# Patient Record
Sex: Male | Born: 1940
Health system: Southern US, Community
[De-identification: ages and names within clinical notes are randomized; demographics above are authoritative.]

## PROBLEM LIST (undated history)

## (undated) DIAGNOSIS — R1310 Dysphagia, unspecified: Secondary | ICD-10-CM

## (undated) DIAGNOSIS — E785 Hyperlipidemia, unspecified: Secondary | ICD-10-CM

## (undated) DIAGNOSIS — I4891 Unspecified atrial fibrillation: Secondary | ICD-10-CM

## (undated) DIAGNOSIS — I10 Essential (primary) hypertension: Secondary | ICD-10-CM

## (undated) DIAGNOSIS — M549 Dorsalgia, unspecified: Secondary | ICD-10-CM

## (undated) DIAGNOSIS — I499 Cardiac arrhythmia, unspecified: Secondary | ICD-10-CM

## (undated) DIAGNOSIS — K649 Unspecified hemorrhoids: Secondary | ICD-10-CM

## (undated) HISTORY — DX: Essential (primary) hypertension: I10

## (undated) HISTORY — PX: BACK SURGERY: SHX140

## (undated) HISTORY — DX: Unspecified hemorrhoids: K64.9

## (undated) HISTORY — DX: Hyperlipidemia, unspecified: E78.5

## (undated) HISTORY — DX: Dorsalgia, unspecified: M54.9

## (undated) HISTORY — PX: ESOPHAGOGASTRODUODENOSCOPY: SHX1529

## (undated) HISTORY — PX: ABLATION: SHX5711

## (undated) HISTORY — DX: Unspecified atrial fibrillation: I48.91

---

## 2002-09-21 HISTORY — PX: LAMINECTOMY: SHX219

## 2004-10-23 ENCOUNTER — Ambulatory Visit: Payer: Self-pay | Admitting: Gastroenterology

## 2009-09-21 HISTORY — PX: EYE SURGERY: SHX253

## 2010-08-17 ENCOUNTER — Inpatient Hospital Stay: Payer: Self-pay | Admitting: Internal Medicine

## 2011-10-05 DIAGNOSIS — I4892 Unspecified atrial flutter: Secondary | ICD-10-CM | POA: Diagnosis not present

## 2011-10-05 DIAGNOSIS — R5383 Other fatigue: Secondary | ICD-10-CM | POA: Diagnosis not present

## 2011-10-05 DIAGNOSIS — Z7982 Long term (current) use of aspirin: Secondary | ICD-10-CM | POA: Diagnosis not present

## 2011-10-05 DIAGNOSIS — E785 Hyperlipidemia, unspecified: Secondary | ICD-10-CM | POA: Diagnosis not present

## 2011-10-05 DIAGNOSIS — Z79899 Other long term (current) drug therapy: Secondary | ICD-10-CM | POA: Diagnosis not present

## 2011-10-05 DIAGNOSIS — I4891 Unspecified atrial fibrillation: Secondary | ICD-10-CM | POA: Diagnosis not present

## 2011-10-05 DIAGNOSIS — R5381 Other malaise: Secondary | ICD-10-CM | POA: Diagnosis not present

## 2011-10-05 DIAGNOSIS — R069 Unspecified abnormalities of breathing: Secondary | ICD-10-CM | POA: Diagnosis not present

## 2011-10-05 DIAGNOSIS — Z01818 Encounter for other preprocedural examination: Secondary | ICD-10-CM | POA: Diagnosis not present

## 2011-10-06 DIAGNOSIS — I499 Cardiac arrhythmia, unspecified: Secondary | ICD-10-CM | POA: Diagnosis not present

## 2011-10-06 DIAGNOSIS — R069 Unspecified abnormalities of breathing: Secondary | ICD-10-CM | POA: Diagnosis not present

## 2011-10-06 DIAGNOSIS — Z79899 Other long term (current) drug therapy: Secondary | ICD-10-CM | POA: Diagnosis not present

## 2011-10-06 DIAGNOSIS — Z7982 Long term (current) use of aspirin: Secondary | ICD-10-CM | POA: Diagnosis not present

## 2011-10-06 DIAGNOSIS — I4892 Unspecified atrial flutter: Secondary | ICD-10-CM | POA: Diagnosis not present

## 2011-10-06 DIAGNOSIS — I4891 Unspecified atrial fibrillation: Secondary | ICD-10-CM | POA: Diagnosis not present

## 2011-10-06 DIAGNOSIS — E785 Hyperlipidemia, unspecified: Secondary | ICD-10-CM | POA: Diagnosis not present

## 2011-10-07 DIAGNOSIS — Z7982 Long term (current) use of aspirin: Secondary | ICD-10-CM | POA: Diagnosis not present

## 2011-10-07 DIAGNOSIS — Z79899 Other long term (current) drug therapy: Secondary | ICD-10-CM | POA: Diagnosis not present

## 2011-10-07 DIAGNOSIS — E785 Hyperlipidemia, unspecified: Secondary | ICD-10-CM | POA: Diagnosis not present

## 2011-10-07 DIAGNOSIS — I4891 Unspecified atrial fibrillation: Secondary | ICD-10-CM | POA: Diagnosis not present

## 2011-10-07 DIAGNOSIS — I4892 Unspecified atrial flutter: Secondary | ICD-10-CM | POA: Diagnosis not present

## 2011-10-07 DIAGNOSIS — I499 Cardiac arrhythmia, unspecified: Secondary | ICD-10-CM | POA: Diagnosis not present

## 2011-10-07 DIAGNOSIS — R069 Unspecified abnormalities of breathing: Secondary | ICD-10-CM | POA: Diagnosis not present

## 2011-10-29 DIAGNOSIS — E782 Mixed hyperlipidemia: Secondary | ICD-10-CM | POA: Diagnosis not present

## 2011-10-29 DIAGNOSIS — I4892 Unspecified atrial flutter: Secondary | ICD-10-CM | POA: Diagnosis not present

## 2011-10-29 DIAGNOSIS — I4891 Unspecified atrial fibrillation: Secondary | ICD-10-CM | POA: Diagnosis not present

## 2011-11-04 ENCOUNTER — Ambulatory Visit (INDEPENDENT_AMBULATORY_CARE_PROVIDER_SITE_OTHER): Payer: Medicare Other | Admitting: Ophthalmology

## 2011-11-04 DIAGNOSIS — H43819 Vitreous degeneration, unspecified eye: Secondary | ICD-10-CM

## 2011-11-04 DIAGNOSIS — H35379 Puckering of macula, unspecified eye: Secondary | ICD-10-CM

## 2011-11-13 DIAGNOSIS — I4892 Unspecified atrial flutter: Secondary | ICD-10-CM | POA: Diagnosis not present

## 2011-11-13 DIAGNOSIS — I4891 Unspecified atrial fibrillation: Secondary | ICD-10-CM | POA: Diagnosis not present

## 2011-11-23 ENCOUNTER — Ambulatory Visit: Payer: Self-pay | Admitting: Family Medicine

## 2011-11-23 DIAGNOSIS — M79609 Pain in unspecified limb: Secondary | ICD-10-CM | POA: Diagnosis not present

## 2011-11-23 DIAGNOSIS — R000 Tachycardia, unspecified: Secondary | ICD-10-CM | POA: Diagnosis not present

## 2011-11-23 DIAGNOSIS — S9000XA Contusion of unspecified ankle, initial encounter: Secondary | ICD-10-CM | POA: Diagnosis not present

## 2011-11-23 DIAGNOSIS — S92919A Unspecified fracture of unspecified toe(s), initial encounter for closed fracture: Secondary | ICD-10-CM | POA: Diagnosis not present

## 2011-11-23 DIAGNOSIS — I4891 Unspecified atrial fibrillation: Secondary | ICD-10-CM | POA: Diagnosis not present

## 2011-11-23 DIAGNOSIS — S90129A Contusion of unspecified lesser toe(s) without damage to nail, initial encounter: Secondary | ICD-10-CM | POA: Diagnosis not present

## 2012-01-22 DIAGNOSIS — I4891 Unspecified atrial fibrillation: Secondary | ICD-10-CM | POA: Diagnosis not present

## 2012-01-22 DIAGNOSIS — I4892 Unspecified atrial flutter: Secondary | ICD-10-CM | POA: Diagnosis not present

## 2012-01-25 DIAGNOSIS — I4891 Unspecified atrial fibrillation: Secondary | ICD-10-CM | POA: Diagnosis not present

## 2012-02-05 DIAGNOSIS — I4891 Unspecified atrial fibrillation: Secondary | ICD-10-CM | POA: Diagnosis not present

## 2012-02-24 ENCOUNTER — Emergency Department: Payer: Self-pay | Admitting: *Deleted

## 2012-02-26 DIAGNOSIS — I4891 Unspecified atrial fibrillation: Secondary | ICD-10-CM | POA: Diagnosis not present

## 2012-02-26 DIAGNOSIS — I10 Essential (primary) hypertension: Secondary | ICD-10-CM | POA: Diagnosis not present

## 2012-03-08 ENCOUNTER — Ambulatory Visit: Payer: Self-pay | Admitting: Family Medicine

## 2012-03-08 DIAGNOSIS — E7800 Pure hypercholesterolemia, unspecified: Secondary | ICD-10-CM | POA: Diagnosis not present

## 2012-03-08 DIAGNOSIS — M5137 Other intervertebral disc degeneration, lumbosacral region: Secondary | ICD-10-CM | POA: Diagnosis not present

## 2012-03-08 DIAGNOSIS — R209 Unspecified disturbances of skin sensation: Secondary | ICD-10-CM | POA: Diagnosis not present

## 2012-03-08 DIAGNOSIS — M549 Dorsalgia, unspecified: Secondary | ICD-10-CM | POA: Diagnosis not present

## 2012-03-08 DIAGNOSIS — R1310 Dysphagia, unspecified: Secondary | ICD-10-CM | POA: Diagnosis not present

## 2012-03-08 DIAGNOSIS — M51379 Other intervertebral disc degeneration, lumbosacral region without mention of lumbar back pain or lower extremity pain: Secondary | ICD-10-CM | POA: Diagnosis not present

## 2012-03-08 DIAGNOSIS — Z0000 Encounter for general adult medical examination without abnormal findings: Secondary | ICD-10-CM | POA: Diagnosis not present

## 2012-03-08 DIAGNOSIS — M79609 Pain in unspecified limb: Secondary | ICD-10-CM | POA: Diagnosis not present

## 2012-03-16 ENCOUNTER — Ambulatory Visit: Payer: Self-pay | Admitting: Family Medicine

## 2012-03-16 DIAGNOSIS — M5126 Other intervertebral disc displacement, lumbar region: Secondary | ICD-10-CM | POA: Diagnosis not present

## 2012-03-16 DIAGNOSIS — M5137 Other intervertebral disc degeneration, lumbosacral region: Secondary | ICD-10-CM | POA: Diagnosis not present

## 2012-03-21 DIAGNOSIS — C44320 Squamous cell carcinoma of skin of unspecified parts of face: Secondary | ICD-10-CM | POA: Diagnosis not present

## 2012-03-21 DIAGNOSIS — L570 Actinic keratosis: Secondary | ICD-10-CM | POA: Diagnosis not present

## 2012-03-21 DIAGNOSIS — D485 Neoplasm of uncertain behavior of skin: Secondary | ICD-10-CM | POA: Diagnosis not present

## 2012-03-21 DIAGNOSIS — Z85828 Personal history of other malignant neoplasm of skin: Secondary | ICD-10-CM | POA: Diagnosis not present

## 2012-03-30 DIAGNOSIS — D0439 Carcinoma in situ of skin of other parts of face: Secondary | ICD-10-CM | POA: Diagnosis not present

## 2012-03-30 DIAGNOSIS — C44320 Squamous cell carcinoma of skin of unspecified parts of face: Secondary | ICD-10-CM | POA: Diagnosis not present

## 2012-03-30 DIAGNOSIS — D0430 Carcinoma in situ of skin of unspecified part of face: Secondary | ICD-10-CM | POA: Diagnosis not present

## 2012-04-12 DIAGNOSIS — M47817 Spondylosis without myelopathy or radiculopathy, lumbosacral region: Secondary | ICD-10-CM | POA: Diagnosis not present

## 2012-04-12 DIAGNOSIS — M48061 Spinal stenosis, lumbar region without neurogenic claudication: Secondary | ICD-10-CM | POA: Diagnosis not present

## 2012-04-12 DIAGNOSIS — M5137 Other intervertebral disc degeneration, lumbosacral region: Secondary | ICD-10-CM | POA: Diagnosis not present

## 2012-04-12 DIAGNOSIS — Q762 Congenital spondylolisthesis: Secondary | ICD-10-CM | POA: Diagnosis not present

## 2012-04-25 DIAGNOSIS — R1310 Dysphagia, unspecified: Secondary | ICD-10-CM | POA: Diagnosis not present

## 2012-04-26 ENCOUNTER — Ambulatory Visit: Payer: Self-pay | Admitting: Gastroenterology

## 2012-04-26 DIAGNOSIS — K219 Gastro-esophageal reflux disease without esophagitis: Secondary | ICD-10-CM | POA: Diagnosis not present

## 2012-04-26 DIAGNOSIS — K449 Diaphragmatic hernia without obstruction or gangrene: Secondary | ICD-10-CM | POA: Diagnosis not present

## 2012-04-26 DIAGNOSIS — R1310 Dysphagia, unspecified: Secondary | ICD-10-CM | POA: Diagnosis not present

## 2012-04-29 DIAGNOSIS — M5137 Other intervertebral disc degeneration, lumbosacral region: Secondary | ICD-10-CM | POA: Diagnosis not present

## 2012-04-29 DIAGNOSIS — M48061 Spinal stenosis, lumbar region without neurogenic claudication: Secondary | ICD-10-CM | POA: Diagnosis not present

## 2012-04-29 DIAGNOSIS — Q762 Congenital spondylolisthesis: Secondary | ICD-10-CM | POA: Diagnosis not present

## 2012-04-29 DIAGNOSIS — M51379 Other intervertebral disc degeneration, lumbosacral region without mention of lumbar back pain or lower extremity pain: Secondary | ICD-10-CM | POA: Diagnosis not present

## 2012-05-07 ENCOUNTER — Ambulatory Visit: Payer: Self-pay

## 2012-05-07 DIAGNOSIS — R21 Rash and other nonspecific skin eruption: Secondary | ICD-10-CM | POA: Diagnosis not present

## 2012-05-07 DIAGNOSIS — Z9889 Other specified postprocedural states: Secondary | ICD-10-CM | POA: Diagnosis not present

## 2012-05-07 DIAGNOSIS — Z79899 Other long term (current) drug therapy: Secondary | ICD-10-CM | POA: Diagnosis not present

## 2012-05-07 LAB — CBC WITH DIFFERENTIAL/PLATELET
Basophil #: 0 10*3/uL (ref 0.0–0.1)
Basophil %: 0.5 %
Eosinophil #: 0.3 10*3/uL (ref 0.0–0.7)
HCT: 41.1 % (ref 40.0–52.0)
HGB: 14 g/dL (ref 13.0–18.0)
Lymphocyte %: 19 %
MCV: 94 fL (ref 80–100)
Monocyte %: 9.1 %
Neutrophil #: 3.8 10*3/uL (ref 1.4–6.5)
Neutrophil %: 66.2 %
Platelet: 150 10*3/uL (ref 150–440)
RBC: 4.38 10*6/uL — ABNORMAL LOW (ref 4.40–5.90)
RDW: 13 % (ref 11.5–14.5)
WBC: 5.7 10*3/uL (ref 3.8–10.6)

## 2012-05-07 LAB — BASIC METABOLIC PANEL
Anion Gap: 4 — ABNORMAL LOW (ref 7–16)
BUN: 14 mg/dL (ref 7–18)
Creatinine: 0.97 mg/dL (ref 0.60–1.30)
EGFR (African American): >60
EGFR (Non-African Amer.): >60
Glucose: 99 mg/dL (ref 65–99)

## 2012-05-13 DIAGNOSIS — M48061 Spinal stenosis, lumbar region without neurogenic claudication: Secondary | ICD-10-CM | POA: Diagnosis not present

## 2012-05-19 ENCOUNTER — Ambulatory Visit: Payer: Self-pay | Admitting: Gastroenterology

## 2012-05-19 DIAGNOSIS — Z79899 Other long term (current) drug therapy: Secondary | ICD-10-CM | POA: Diagnosis not present

## 2012-05-19 DIAGNOSIS — M48061 Spinal stenosis, lumbar region without neurogenic claudication: Secondary | ICD-10-CM | POA: Diagnosis not present

## 2012-05-19 DIAGNOSIS — I4891 Unspecified atrial fibrillation: Secondary | ICD-10-CM | POA: Diagnosis not present

## 2012-05-19 DIAGNOSIS — Z6826 Body mass index (BMI) 26.0-26.9, adult: Secondary | ICD-10-CM | POA: Diagnosis not present

## 2012-05-19 DIAGNOSIS — Z801 Family history of malignant neoplasm of trachea, bronchus and lung: Secondary | ICD-10-CM | POA: Diagnosis not present

## 2012-05-19 DIAGNOSIS — K2980 Duodenitis without bleeding: Secondary | ICD-10-CM | POA: Diagnosis not present

## 2012-05-19 DIAGNOSIS — R1310 Dysphagia, unspecified: Secondary | ICD-10-CM | POA: Diagnosis not present

## 2012-05-19 DIAGNOSIS — K222 Esophageal obstruction: Secondary | ICD-10-CM | POA: Diagnosis not present

## 2012-05-19 DIAGNOSIS — I4892 Unspecified atrial flutter: Secondary | ICD-10-CM | POA: Diagnosis not present

## 2012-05-19 DIAGNOSIS — E785 Hyperlipidemia, unspecified: Secondary | ICD-10-CM | POA: Diagnosis not present

## 2012-05-19 DIAGNOSIS — K5289 Other specified noninfective gastroenteritis and colitis: Secondary | ICD-10-CM | POA: Diagnosis not present

## 2012-05-19 DIAGNOSIS — Z7982 Long term (current) use of aspirin: Secondary | ICD-10-CM | POA: Diagnosis not present

## 2012-05-24 LAB — PATHOLOGY REPORT

## 2012-05-25 DIAGNOSIS — I4891 Unspecified atrial fibrillation: Secondary | ICD-10-CM | POA: Diagnosis not present

## 2012-05-25 DIAGNOSIS — E782 Mixed hyperlipidemia: Secondary | ICD-10-CM | POA: Diagnosis not present

## 2012-05-25 DIAGNOSIS — I10 Essential (primary) hypertension: Secondary | ICD-10-CM | POA: Diagnosis not present

## 2012-06-21 DIAGNOSIS — M48061 Spinal stenosis, lumbar region without neurogenic claudication: Secondary | ICD-10-CM | POA: Diagnosis not present

## 2012-06-21 DIAGNOSIS — Z79899 Other long term (current) drug therapy: Secondary | ICD-10-CM | POA: Diagnosis not present

## 2012-06-28 DIAGNOSIS — Z23 Encounter for immunization: Secondary | ICD-10-CM | POA: Diagnosis not present

## 2012-06-30 DIAGNOSIS — Z85828 Personal history of other malignant neoplasm of skin: Secondary | ICD-10-CM | POA: Diagnosis not present

## 2012-06-30 DIAGNOSIS — L570 Actinic keratosis: Secondary | ICD-10-CM | POA: Diagnosis not present

## 2012-07-18 DIAGNOSIS — I4891 Unspecified atrial fibrillation: Secondary | ICD-10-CM | POA: Diagnosis not present

## 2012-09-28 DIAGNOSIS — E7800 Pure hypercholesterolemia, unspecified: Secondary | ICD-10-CM | POA: Diagnosis not present

## 2012-10-20 DIAGNOSIS — I4891 Unspecified atrial fibrillation: Secondary | ICD-10-CM | POA: Diagnosis not present

## 2012-10-21 DIAGNOSIS — Z79899 Other long term (current) drug therapy: Secondary | ICD-10-CM | POA: Diagnosis not present

## 2012-11-03 ENCOUNTER — Ambulatory Visit (INDEPENDENT_AMBULATORY_CARE_PROVIDER_SITE_OTHER): Payer: Medicare Other | Admitting: Ophthalmology

## 2012-11-25 ENCOUNTER — Ambulatory Visit (INDEPENDENT_AMBULATORY_CARE_PROVIDER_SITE_OTHER): Payer: Medicare Other | Admitting: Ophthalmology

## 2012-12-09 ENCOUNTER — Ambulatory Visit (INDEPENDENT_AMBULATORY_CARE_PROVIDER_SITE_OTHER): Payer: Medicare Other | Admitting: Ophthalmology

## 2012-12-09 DIAGNOSIS — H26499 Other secondary cataract, unspecified eye: Secondary | ICD-10-CM | POA: Diagnosis not present

## 2012-12-09 DIAGNOSIS — H43819 Vitreous degeneration, unspecified eye: Secondary | ICD-10-CM | POA: Diagnosis not present

## 2012-12-09 DIAGNOSIS — H35379 Puckering of macula, unspecified eye: Secondary | ICD-10-CM | POA: Diagnosis not present

## 2013-01-11 ENCOUNTER — Emergency Department: Payer: Self-pay

## 2013-01-11 DIAGNOSIS — M7989 Other specified soft tissue disorders: Secondary | ICD-10-CM | POA: Diagnosis not present

## 2013-01-11 DIAGNOSIS — R609 Edema, unspecified: Secondary | ICD-10-CM | POA: Diagnosis not present

## 2013-01-11 LAB — CBC
HCT: 43 % (ref 40.0–52.0)
HGB: 14.4 g/dL (ref 13.0–18.0)
MCH: 31.4 pg (ref 26.0–34.0)
MCHC: 33.6 g/dL (ref 32.0–36.0)
MCV: 93 fL (ref 80–100)
Platelet: 171 10*3/uL (ref 150–440)
RBC: 4.61 10*6/uL (ref 4.40–5.90)
RDW: 13.1 % (ref 11.5–14.5)
WBC: 8.6 10*3/uL (ref 3.8–10.6)

## 2013-01-11 LAB — COMPREHENSIVE METABOLIC PANEL
Albumin: 3.9 g/dL (ref 3.4–5.0)
Calcium, Total: 8.8 mg/dL (ref 8.5–10.1)
Creatinine: 0.85 mg/dL (ref 0.60–1.30)
EGFR (Non-African Amer.): >60
Glucose: 85 mg/dL (ref 65–99)
Osmolality: 271 (ref 275–301)
Potassium: 4 mmol/L (ref 3.5–5.1)
SGOT(AST): 24 U/L (ref 15–37)
Sodium: 136 mmol/L (ref 136–145)

## 2013-01-12 DIAGNOSIS — M7989 Other specified soft tissue disorders: Secondary | ICD-10-CM | POA: Diagnosis not present

## 2013-01-12 DIAGNOSIS — I872 Venous insufficiency (chronic) (peripheral): Secondary | ICD-10-CM | POA: Diagnosis not present

## 2013-01-16 ENCOUNTER — Other Ambulatory Visit (INDEPENDENT_AMBULATORY_CARE_PROVIDER_SITE_OTHER): Payer: Medicare Other | Admitting: Ophthalmology

## 2013-01-16 DIAGNOSIS — H2700 Aphakia, unspecified eye: Secondary | ICD-10-CM

## 2013-01-18 DIAGNOSIS — I4891 Unspecified atrial fibrillation: Secondary | ICD-10-CM | POA: Diagnosis not present

## 2013-01-18 DIAGNOSIS — I10 Essential (primary) hypertension: Secondary | ICD-10-CM | POA: Diagnosis not present

## 2013-01-19 DIAGNOSIS — I890 Lymphedema, not elsewhere classified: Secondary | ICD-10-CM | POA: Diagnosis not present

## 2013-01-19 DIAGNOSIS — M7989 Other specified soft tissue disorders: Secondary | ICD-10-CM | POA: Diagnosis not present

## 2013-01-19 DIAGNOSIS — I10 Essential (primary) hypertension: Secondary | ICD-10-CM | POA: Diagnosis not present

## 2013-01-19 DIAGNOSIS — M79609 Pain in unspecified limb: Secondary | ICD-10-CM | POA: Diagnosis not present

## 2013-02-17 IMAGING — RF DG BARIUM SWALLOW
3 series · 12 of 12 positions shown · non-contrast
Comparison: none

REASON FOR EXAM: dysphagia  with tablet
COMMENTS:

[Series 1: fluoro_barium 2fps_bw · 0.17mm/px · 4 of 20 frames shown (1 of 3)]
[frame 4/20]
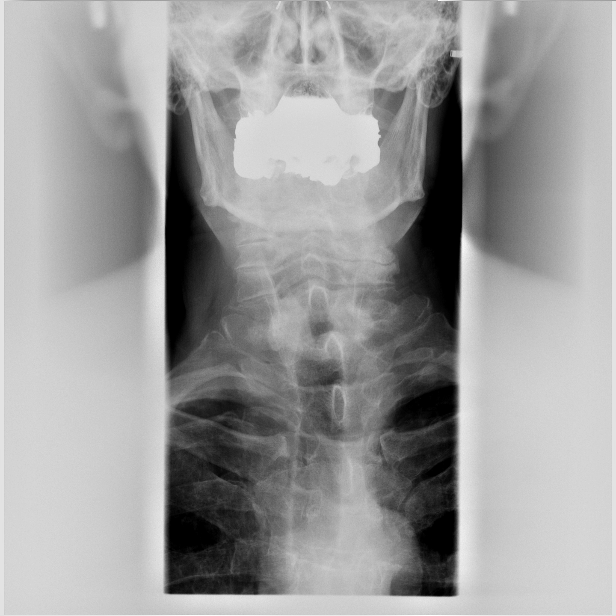
[frame 8/20]
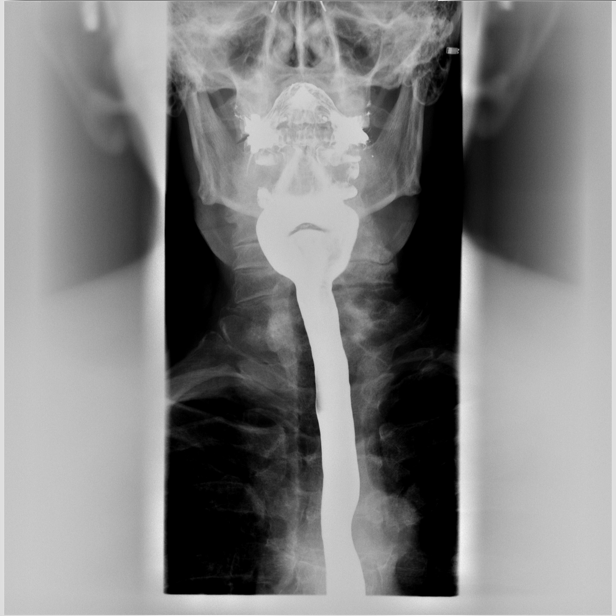
[frame 11/20]
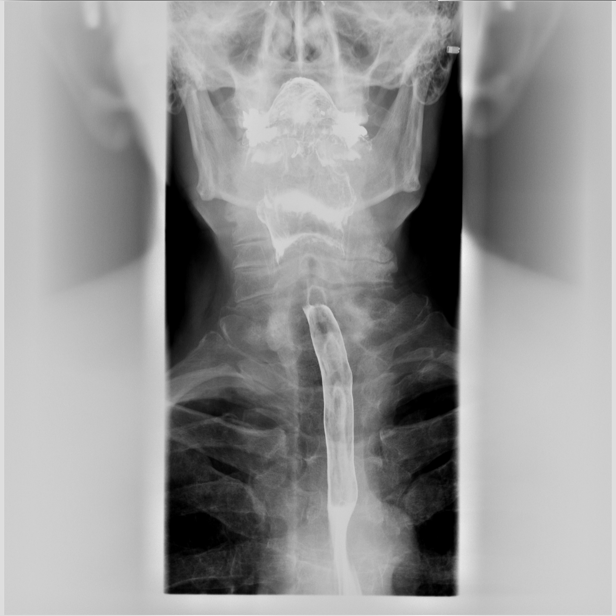
[frame 18/20]
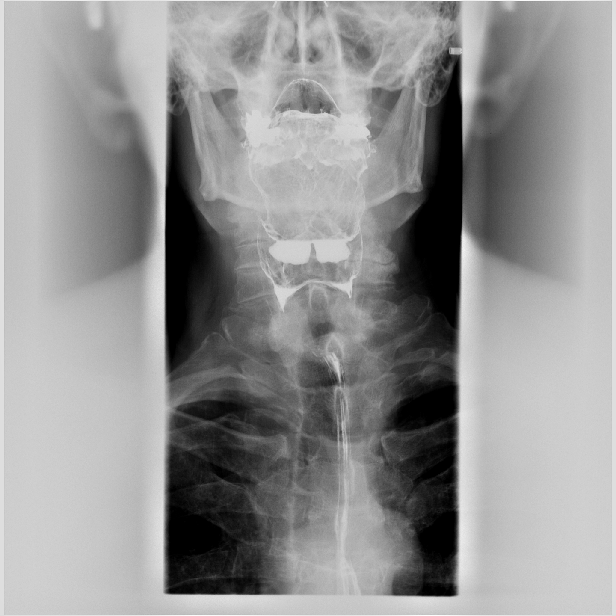

[Series 2: fluoro_barium 2fps_bw · 0.17mm/px · 4 of 20 frames shown (2 of 3)]
[frame 4/20]
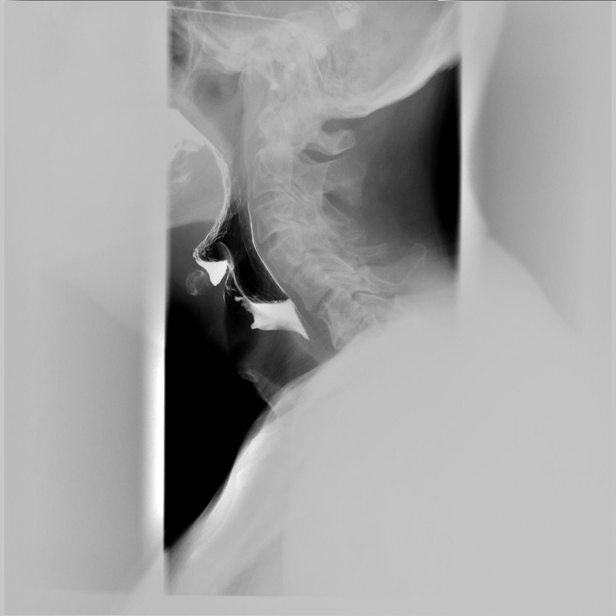
[frame 8/20]
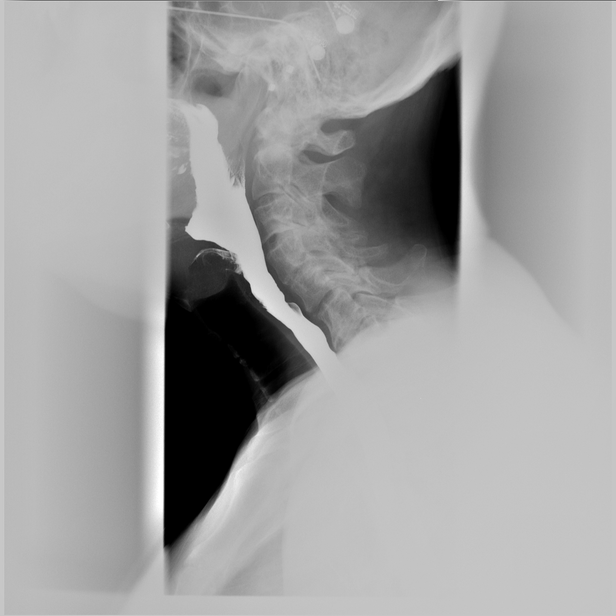
[frame 11/20]
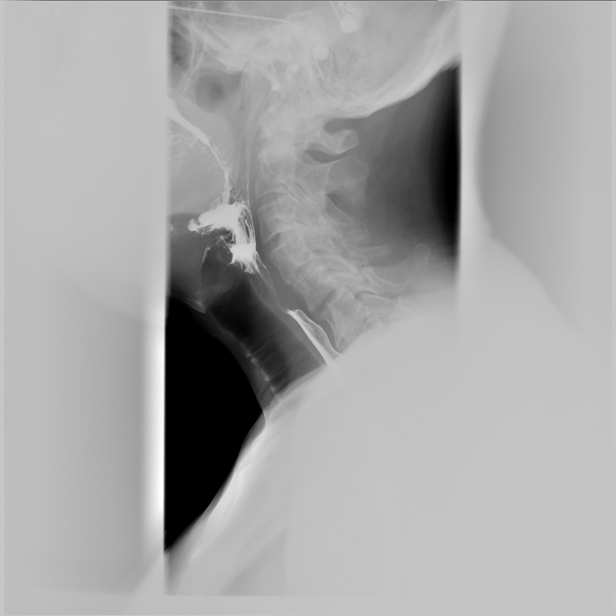
[frame 18/20]
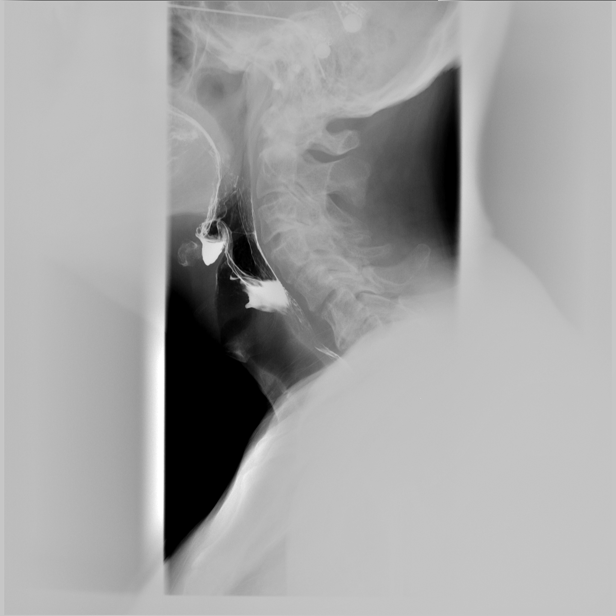

[Series 3: fluoro_barium 2fps_bw · 0.17mm/px · 4 of 24 frames shown (3 of 3)]
[frame 4/24]
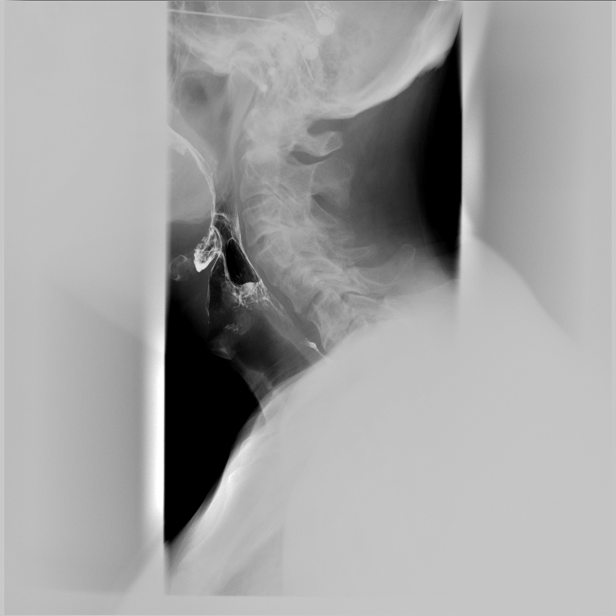
[frame 9/24]
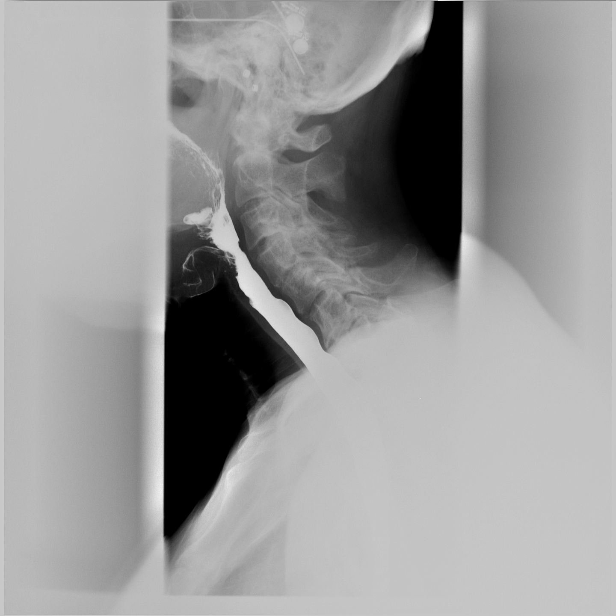
[frame 13/24]
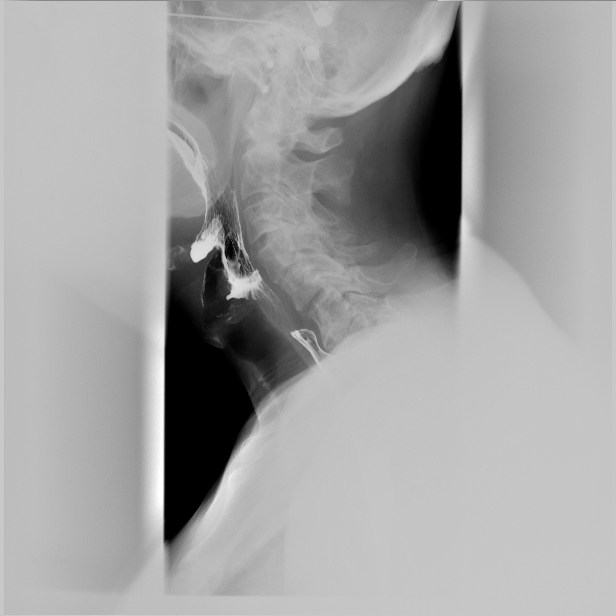
[frame 21/24]
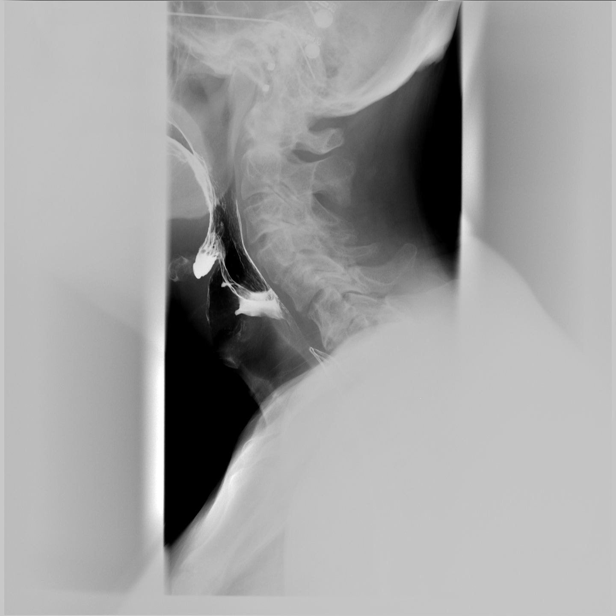

[12 of 12 positions shown; findings below may reference images not displayed]

PROCEDURE:     FL  - FL BARIUM SWALLOW  - April 26, 2012 [DATE]

RESULT:

Procedure: Dynamic imaging of the cervical esophagus was performed during
active swallowing status post administration of oral barium. This was
followed by oral administration of effervescent crystals and barium.
Representative spot imaging was obtained.
FINDINGS: The cervical esophagus is unremarkable.

There is appropriate relaxation of the lower esophageal sphincter. There is
no evidence of esophageal dysmotility. A very small sliding hiatal hernia is
identified. Mild gastroesophageal reflux is elicited to the mid esophagus. A
13 mm Barotab was administered which traversed the esophagus without
complications. The thoracic esophagus is otherwise unremarkable.
IMPRESSION: 1. Small sliding hiatal hernia.
2. Mild gastroesophageal reflux.

## 2013-04-04 DIAGNOSIS — E7800 Pure hypercholesterolemia, unspecified: Secondary | ICD-10-CM | POA: Diagnosis not present

## 2013-04-04 DIAGNOSIS — Z0000 Encounter for general adult medical examination without abnormal findings: Secondary | ICD-10-CM | POA: Diagnosis not present

## 2013-04-26 DIAGNOSIS — E7800 Pure hypercholesterolemia, unspecified: Secondary | ICD-10-CM | POA: Diagnosis not present

## 2013-04-26 DIAGNOSIS — Z85828 Personal history of other malignant neoplasm of skin: Secondary | ICD-10-CM | POA: Diagnosis not present

## 2013-04-26 DIAGNOSIS — L570 Actinic keratosis: Secondary | ICD-10-CM | POA: Diagnosis not present

## 2013-04-26 DIAGNOSIS — D485 Neoplasm of uncertain behavior of skin: Secondary | ICD-10-CM | POA: Diagnosis not present

## 2013-04-26 DIAGNOSIS — I10 Essential (primary) hypertension: Secondary | ICD-10-CM | POA: Diagnosis not present

## 2013-05-02 DIAGNOSIS — M48061 Spinal stenosis, lumbar region without neurogenic claudication: Secondary | ICD-10-CM | POA: Diagnosis not present

## 2013-05-02 DIAGNOSIS — M5137 Other intervertebral disc degeneration, lumbosacral region: Secondary | ICD-10-CM | POA: Diagnosis not present

## 2013-05-02 DIAGNOSIS — M47817 Spondylosis without myelopathy or radiculopathy, lumbosacral region: Secondary | ICD-10-CM | POA: Diagnosis not present

## 2013-05-05 DIAGNOSIS — M545 Low back pain, unspecified: Secondary | ICD-10-CM | POA: Diagnosis not present

## 2013-05-05 DIAGNOSIS — M48061 Spinal stenosis, lumbar region without neurogenic claudication: Secondary | ICD-10-CM | POA: Diagnosis not present

## 2013-05-29 DIAGNOSIS — D044 Carcinoma in situ of skin of scalp and neck: Secondary | ICD-10-CM | POA: Diagnosis not present

## 2013-06-12 DIAGNOSIS — Z23 Encounter for immunization: Secondary | ICD-10-CM | POA: Diagnosis not present

## 2013-08-28 DIAGNOSIS — L570 Actinic keratosis: Secondary | ICD-10-CM | POA: Diagnosis not present

## 2013-08-28 DIAGNOSIS — Z85828 Personal history of other malignant neoplasm of skin: Secondary | ICD-10-CM | POA: Diagnosis not present

## 2013-09-05 DIAGNOSIS — L0291 Cutaneous abscess, unspecified: Secondary | ICD-10-CM | POA: Diagnosis not present

## 2013-09-05 DIAGNOSIS — S61409A Unspecified open wound of unspecified hand, initial encounter: Secondary | ICD-10-CM | POA: Diagnosis not present

## 2013-09-26 DIAGNOSIS — S61409A Unspecified open wound of unspecified hand, initial encounter: Secondary | ICD-10-CM | POA: Diagnosis not present

## 2013-09-26 DIAGNOSIS — J019 Acute sinusitis, unspecified: Secondary | ICD-10-CM | POA: Diagnosis not present

## 2013-09-26 DIAGNOSIS — E7800 Pure hypercholesterolemia, unspecified: Secondary | ICD-10-CM | POA: Diagnosis not present

## 2013-10-02 DIAGNOSIS — E7800 Pure hypercholesterolemia, unspecified: Secondary | ICD-10-CM | POA: Diagnosis not present

## 2013-10-31 DIAGNOSIS — I4891 Unspecified atrial fibrillation: Secondary | ICD-10-CM | POA: Diagnosis not present

## 2013-10-31 DIAGNOSIS — I10 Essential (primary) hypertension: Secondary | ICD-10-CM | POA: Diagnosis not present

## 2013-10-31 DIAGNOSIS — R0602 Shortness of breath: Secondary | ICD-10-CM | POA: Diagnosis not present

## 2013-11-01 DIAGNOSIS — Z79899 Other long term (current) drug therapy: Secondary | ICD-10-CM | POA: Diagnosis not present

## 2014-01-17 ENCOUNTER — Ambulatory Visit (INDEPENDENT_AMBULATORY_CARE_PROVIDER_SITE_OTHER): Payer: Medicare Other | Admitting: Ophthalmology

## 2014-01-17 DIAGNOSIS — H43819 Vitreous degeneration, unspecified eye: Secondary | ICD-10-CM

## 2014-01-17 DIAGNOSIS — H35379 Puckering of macula, unspecified eye: Secondary | ICD-10-CM | POA: Diagnosis not present

## 2014-01-30 DIAGNOSIS — H35379 Puckering of macula, unspecified eye: Secondary | ICD-10-CM | POA: Diagnosis not present

## 2014-01-30 DIAGNOSIS — Z9849 Cataract extraction status, unspecified eye: Secondary | ICD-10-CM | POA: Diagnosis not present

## 2014-01-30 DIAGNOSIS — Z961 Presence of intraocular lens: Secondary | ICD-10-CM | POA: Diagnosis not present

## 2014-01-30 DIAGNOSIS — H5200 Hypermetropia, unspecified eye: Secondary | ICD-10-CM | POA: Diagnosis not present

## 2014-02-21 DIAGNOSIS — C44320 Squamous cell carcinoma of skin of unspecified parts of face: Secondary | ICD-10-CM | POA: Diagnosis not present

## 2014-02-21 DIAGNOSIS — D485 Neoplasm of uncertain behavior of skin: Secondary | ICD-10-CM | POA: Diagnosis not present

## 2014-02-21 DIAGNOSIS — L821 Other seborrheic keratosis: Secondary | ICD-10-CM | POA: Diagnosis not present

## 2014-02-21 DIAGNOSIS — L570 Actinic keratosis: Secondary | ICD-10-CM | POA: Diagnosis not present

## 2014-03-20 DIAGNOSIS — M48061 Spinal stenosis, lumbar region without neurogenic claudication: Secondary | ICD-10-CM | POA: Diagnosis not present

## 2014-03-27 DIAGNOSIS — C44320 Squamous cell carcinoma of skin of unspecified parts of face: Secondary | ICD-10-CM | POA: Diagnosis not present

## 2014-03-30 DIAGNOSIS — M48062 Spinal stenosis, lumbar region with neurogenic claudication: Secondary | ICD-10-CM | POA: Diagnosis not present

## 2014-03-30 DIAGNOSIS — M48061 Spinal stenosis, lumbar region without neurogenic claudication: Secondary | ICD-10-CM | POA: Diagnosis not present

## 2014-05-03 DIAGNOSIS — I4891 Unspecified atrial fibrillation: Secondary | ICD-10-CM | POA: Insufficient documentation

## 2014-05-03 DIAGNOSIS — E785 Hyperlipidemia, unspecified: Secondary | ICD-10-CM | POA: Insufficient documentation

## 2014-05-03 DIAGNOSIS — M48061 Spinal stenosis, lumbar region without neurogenic claudication: Secondary | ICD-10-CM | POA: Diagnosis not present

## 2014-06-21 DIAGNOSIS — Z23 Encounter for immunization: Secondary | ICD-10-CM | POA: Diagnosis not present

## 2014-06-26 DIAGNOSIS — D485 Neoplasm of uncertain behavior of skin: Secondary | ICD-10-CM | POA: Diagnosis not present

## 2014-06-26 DIAGNOSIS — L570 Actinic keratosis: Secondary | ICD-10-CM | POA: Diagnosis not present

## 2014-06-26 DIAGNOSIS — L821 Other seborrheic keratosis: Secondary | ICD-10-CM | POA: Diagnosis not present

## 2014-06-26 DIAGNOSIS — X32XXXA Exposure to sunlight, initial encounter: Secondary | ICD-10-CM | POA: Diagnosis not present

## 2014-06-26 DIAGNOSIS — Z85828 Personal history of other malignant neoplasm of skin: Secondary | ICD-10-CM | POA: Diagnosis not present

## 2014-06-26 DIAGNOSIS — D0462 Carcinoma in situ of skin of left upper limb, including shoulder: Secondary | ICD-10-CM | POA: Diagnosis not present

## 2014-07-09 DIAGNOSIS — Z1389 Encounter for screening for other disorder: Secondary | ICD-10-CM | POA: Diagnosis not present

## 2014-07-09 DIAGNOSIS — E780 Pure hypercholesterolemia: Secondary | ICD-10-CM | POA: Diagnosis not present

## 2014-07-09 DIAGNOSIS — Z0000 Encounter for general adult medical examination without abnormal findings: Secondary | ICD-10-CM | POA: Diagnosis not present

## 2014-07-24 DIAGNOSIS — D0439 Carcinoma in situ of skin of other parts of face: Secondary | ICD-10-CM | POA: Diagnosis not present

## 2014-08-06 DIAGNOSIS — D0462 Carcinoma in situ of skin of left upper limb, including shoulder: Secondary | ICD-10-CM | POA: Diagnosis not present

## 2014-09-25 DIAGNOSIS — M4806 Spinal stenosis, lumbar region: Secondary | ICD-10-CM | POA: Diagnosis not present

## 2014-10-03 DIAGNOSIS — M5136 Other intervertebral disc degeneration, lumbar region: Secondary | ICD-10-CM | POA: Diagnosis not present

## 2014-10-03 DIAGNOSIS — M5126 Other intervertebral disc displacement, lumbar region: Secondary | ICD-10-CM | POA: Diagnosis not present

## 2014-10-03 DIAGNOSIS — M47816 Spondylosis without myelopathy or radiculopathy, lumbar region: Secondary | ICD-10-CM | POA: Diagnosis not present

## 2014-10-03 DIAGNOSIS — M47817 Spondylosis without myelopathy or radiculopathy, lumbosacral region: Secondary | ICD-10-CM | POA: Diagnosis not present

## 2014-10-03 DIAGNOSIS — N3289 Other specified disorders of bladder: Secondary | ICD-10-CM | POA: Diagnosis not present

## 2014-10-22 DIAGNOSIS — M4806 Spinal stenosis, lumbar region: Secondary | ICD-10-CM | POA: Diagnosis not present

## 2014-10-22 DIAGNOSIS — I480 Paroxysmal atrial fibrillation: Secondary | ICD-10-CM | POA: Diagnosis not present

## 2014-10-22 DIAGNOSIS — E782 Mixed hyperlipidemia: Secondary | ICD-10-CM | POA: Diagnosis not present

## 2014-10-22 DIAGNOSIS — Z79899 Other long term (current) drug therapy: Secondary | ICD-10-CM | POA: Diagnosis not present

## 2014-10-22 DIAGNOSIS — J449 Chronic obstructive pulmonary disease, unspecified: Secondary | ICD-10-CM | POA: Diagnosis not present

## 2014-11-06 DIAGNOSIS — R109 Unspecified abdominal pain: Secondary | ICD-10-CM | POA: Diagnosis not present

## 2014-11-13 DIAGNOSIS — M7552 Bursitis of left shoulder: Secondary | ICD-10-CM | POA: Diagnosis not present

## 2014-11-13 DIAGNOSIS — M25512 Pain in left shoulder: Secondary | ICD-10-CM | POA: Diagnosis not present

## 2014-11-19 DIAGNOSIS — L821 Other seborrheic keratosis: Secondary | ICD-10-CM | POA: Diagnosis not present

## 2014-11-19 DIAGNOSIS — Z85828 Personal history of other malignant neoplasm of skin: Secondary | ICD-10-CM | POA: Diagnosis not present

## 2014-11-19 DIAGNOSIS — L570 Actinic keratosis: Secondary | ICD-10-CM | POA: Diagnosis not present

## 2014-11-19 DIAGNOSIS — D1801 Hemangioma of skin and subcutaneous tissue: Secondary | ICD-10-CM | POA: Diagnosis not present

## 2014-11-19 DIAGNOSIS — R109 Unspecified abdominal pain: Secondary | ICD-10-CM | POA: Diagnosis not present

## 2014-11-19 DIAGNOSIS — X32XXXA Exposure to sunlight, initial encounter: Secondary | ICD-10-CM | POA: Diagnosis not present

## 2014-11-26 DIAGNOSIS — R1030 Lower abdominal pain, unspecified: Secondary | ICD-10-CM | POA: Diagnosis not present

## 2014-12-06 ENCOUNTER — Ambulatory Visit: Payer: Self-pay | Admitting: Gastroenterology

## 2014-12-06 DIAGNOSIS — Z7982 Long term (current) use of aspirin: Secondary | ICD-10-CM | POA: Diagnosis not present

## 2014-12-06 DIAGNOSIS — Z79899 Other long term (current) drug therapy: Secondary | ICD-10-CM | POA: Diagnosis not present

## 2014-12-06 DIAGNOSIS — I4891 Unspecified atrial fibrillation: Secondary | ICD-10-CM | POA: Diagnosis not present

## 2014-12-06 DIAGNOSIS — R1030 Lower abdominal pain, unspecified: Secondary | ICD-10-CM | POA: Diagnosis not present

## 2014-12-06 HISTORY — PX: COLONOSCOPY: SHX5424

## 2014-12-13 ENCOUNTER — Ambulatory Visit: Payer: Self-pay | Admitting: Gastroenterology

## 2014-12-13 DIAGNOSIS — K4020 Bilateral inguinal hernia, without obstruction or gangrene, not specified as recurrent: Secondary | ICD-10-CM | POA: Diagnosis not present

## 2014-12-13 DIAGNOSIS — R1030 Lower abdominal pain, unspecified: Secondary | ICD-10-CM | POA: Diagnosis not present

## 2014-12-13 DIAGNOSIS — M5136 Other intervertebral disc degeneration, lumbar region: Secondary | ICD-10-CM | POA: Diagnosis not present

## 2014-12-31 ENCOUNTER — Other Ambulatory Visit: Payer: Self-pay | Admitting: General Surgery

## 2014-12-31 ENCOUNTER — Ambulatory Visit (INDEPENDENT_AMBULATORY_CARE_PROVIDER_SITE_OTHER): Payer: Medicare Other | Admitting: General Surgery

## 2014-12-31 ENCOUNTER — Encounter: Payer: Self-pay | Admitting: General Surgery

## 2014-12-31 VITALS — BP 132/68 | HR 74 | Resp 14 | Ht 75.0 in | Wt 214.0 lb

## 2014-12-31 DIAGNOSIS — K4090 Unilateral inguinal hernia, without obstruction or gangrene, not specified as recurrent: Secondary | ICD-10-CM

## 2014-12-31 NOTE — Progress Notes (Signed)
Patient ID: Seth Stewart., male   DOB: Mar 16, 1941, 74 y.o.   MRN: [id number]  Chief Complaint  Patient presents with  . Other    right inguinal hernia    HPI Seth Stewart. is a 74 y.o. male here today for a right inguinal hernia. Patient noticed discomfort in this area about January 2016. At the time of his primary care visit, no bulge was reported. He states the area has got bigger and swelling. Patient states he [name] in February 2016 for a planned colonoscopy and there area was some bulging at that time. He states the area pops in and out.  He is become increasingly symptomatic.   He had a CT scan done on 12/12/14.    HPI  Past Medical History  Diagnosis Date  . Hyperlipidemia   . AF (atrial fibrillation)   . Hemorrhoids   . Back pain     Past Surgical History  Procedure Laterality Date  . Eye surgery  2011  . Laminectomy  2004  . Colonoscopy  12/06/2014    [name]  . Ablation      No family history on file.  Social History History  Substance Use Topics  . Smoking status: Never Smoker   . Smokeless tobacco: Not on file  . Alcohol Use: 1.2 oz/week    2 Standard drinks or equivalent per week    No Known Allergies  Current Outpatient Prescriptions  Medication Sig Dispense Refill  . Ascorbic Acid (VITAMIN C) 1000 MG tablet Take 1,000 mg by mouth daily.    Marland Kitchen aspirin 81 MG tablet Take 81 mg by mouth daily.    . CRESTOR 5 MG tablet   0  . Flaxseed, Linseed, (FLAX SEEDS PO) Take by mouth.    . metoprolol tartrate (LOPRESSOR) 25 MG tablet Take 12.5 mg by mouth 2 (two) times daily.  0  . Multiple Vitamins-Minerals (MULTIVITAMIN WITH MINERALS) tablet Take 1 tablet by mouth daily.    . vitamin E 400 UNIT capsule Take 400 Units by mouth daily.     No current facility-administered medications for this visit.    Review of Systems Review of Systems  Constitutional: Negative.   Respiratory: Negative.   Cardiovascular: Negative.     Blood pressure  132/68, pulse 74, resp. rate 14, height 6\' 3"  (1.905 m), weight 214 lb (97.07 kg).  Physical Exam Physical Exam  Constitutional: He is oriented to person, place, and time. He appears well-developed and well-nourished.  Eyes: Conjunctivae are normal. No scleral icterus.  Neck: Neck supple.  Cardiovascular: Normal rate, regular rhythm and normal heart sounds.   Pulmonary/Chest: Effort normal and breath sounds normal.  Abdominal: Soft. Normal appearance and bowel sounds are normal. There is no tenderness. A hernia is present. Hernia confirmed positive in the right inguinal area (  reducible).    Neurological: He is alert and oriented to person, place, and time.  Skin: Skin is warm and dry.    Data Reviewed CT scan of the abdomen and pelvis dated 12/13/2014 was reviewed. Fat within the inguinal canals were noted bilaterally with the right more prominent than the left. Prominent vessels in the upper scrotum and possibility of varicocele was noted. Degenerative disease of both hips as well as the L3/L4-5 and L5-S1 areas were noted. Normal appendix.  Assessment    Symptomatic right inguinal hernia. No evidence of clinically significant left inguinal hernia.    Plan       Discussed elective  repair of his right inguinal hernia repair area the risks and benefits of surgery including those related to bleeding, infection and setting mesh placement were reviewed.   Patient's surgery has been scheduled for 01-07-15 at [facility]. It is okay for patient to continue 81 mg aspirin once daily.   PCP:  [name] 12/31/2014, 8:45 PM

## 2014-12-31 NOTE — Patient Instructions (Addendum)

## 2015-01-01 DIAGNOSIS — M5416 Radiculopathy, lumbar region: Secondary | ICD-10-CM | POA: Diagnosis not present

## 2015-01-01 DIAGNOSIS — M4806 Spinal stenosis, lumbar region: Secondary | ICD-10-CM | POA: Diagnosis not present

## 2015-01-02 ENCOUNTER — Ambulatory Visit
Admit: 2015-01-02 | Disposition: A | Discharge status: Home / Self Care | Payer: Self-pay | Attending: General Surgery | Admitting: General Surgery

## 2015-01-07 ENCOUNTER — Ambulatory Visit
Admit: 2015-01-07 | Disposition: A | Discharge status: Home / Self Care | Payer: Self-pay | Attending: General Surgery | Admitting: General Surgery

## 2015-01-07 DIAGNOSIS — I4891 Unspecified atrial fibrillation: Secondary | ICD-10-CM | POA: Diagnosis not present

## 2015-01-07 DIAGNOSIS — Z79899 Other long term (current) drug therapy: Secondary | ICD-10-CM | POA: Diagnosis not present

## 2015-01-07 DIAGNOSIS — M1990 Unspecified osteoarthritis, unspecified site: Secondary | ICD-10-CM | POA: Diagnosis not present

## 2015-01-07 DIAGNOSIS — H9190 Unspecified hearing loss, unspecified ear: Secondary | ICD-10-CM | POA: Diagnosis not present

## 2015-01-07 DIAGNOSIS — Z7982 Long term (current) use of aspirin: Secondary | ICD-10-CM | POA: Diagnosis not present

## 2015-01-07 DIAGNOSIS — E785 Hyperlipidemia, unspecified: Secondary | ICD-10-CM | POA: Diagnosis not present

## 2015-01-07 DIAGNOSIS — Z974 Presence of external hearing-aid: Secondary | ICD-10-CM | POA: Diagnosis not present

## 2015-01-07 DIAGNOSIS — K4090 Unilateral inguinal hernia, without obstruction or gangrene, not specified as recurrent: Secondary | ICD-10-CM | POA: Diagnosis not present

## 2015-01-07 DIAGNOSIS — I499 Cardiac arrhythmia, unspecified: Secondary | ICD-10-CM | POA: Diagnosis not present

## 2015-01-07 HISTORY — PX: HERNIA REPAIR: SHX51

## 2015-01-08 ENCOUNTER — Encounter: Payer: Self-pay | Admitting: General Surgery

## 2015-01-14 ENCOUNTER — Encounter: Payer: Self-pay | Admitting: General Surgery

## 2015-01-14 ENCOUNTER — Ambulatory Visit (INDEPENDENT_AMBULATORY_CARE_PROVIDER_SITE_OTHER): Payer: Medicare Other | Admitting: General Surgery

## 2015-01-14 VITALS — BP 130/74 | HR 74 | Resp 12 | Ht 75.0 in | Wt 216.0 lb

## 2015-01-14 DIAGNOSIS — K4090 Unilateral inguinal hernia, without obstruction or gangrene, not specified as recurrent: Secondary | ICD-10-CM

## 2015-01-14 NOTE — Patient Instructions (Signed)
Proper lifting techniques reviewed.

## 2015-01-14 NOTE — Progress Notes (Signed)
Patient ID: Seth Stewart., male   DOB: 1941-01-03, 74 y.o.   MRN: [id number]  Chief Complaint  Patient presents with  . Routine Post Op    right inguinal hernia    HPI Seth Stewart. is a 74 y.o. male here today for a right inguinal hernia repair done on 01/07/15. He states he is doing well.  HPI  Past Medical History  Diagnosis Date  . Hyperlipidemia   . AF (atrial fibrillation)   . Hemorrhoids   . Back pain     Past Surgical History  Procedure Laterality Date  . Eye surgery  2011  . Laminectomy  2004  . Colonoscopy  12/06/2014    Dr. Candace Cruise  . Ablation    . Hernia repair Right 01/07/15    right inguinal repair, medium ultra Pro mesh    No family history on file.  Social History History  Substance Use Topics  . Smoking status: Never Smoker   . Smokeless tobacco: Not on file  . Alcohol Use: 1.2 oz/week    2 Standard drinks or equivalent per week    No Known Allergies  Current Outpatient Prescriptions  Medication Sig Dispense Refill  . Ascorbic Acid (VITAMIN C) 1000 MG tablet Take 1,000 mg by mouth daily.    Marland Kitchen aspirin 81 MG tablet Take 81 mg by mouth daily.    . CRESTOR 5 MG tablet   0  . Flaxseed, Linseed, (FLAX SEEDS PO) Take by mouth.    . metoprolol tartrate (LOPRESSOR) 25 MG tablet Take 12.5 mg by mouth 2 (two) times daily.  0  . Multiple Vitamins-Minerals (MULTIVITAMIN WITH MINERALS) tablet Take 1 tablet by mouth daily.    . vitamin E 400 UNIT capsule Take 400 Units by mouth daily.     No current facility-administered medications for this visit.    Review of Systems Review of Systems  Constitutional: Negative.   Respiratory: Negative.   Cardiovascular: Negative.     Blood pressure 130/74, pulse 74, resp. rate 12, height 6\' 3"  (1.905 m), weight 216 lb (97.977 kg).  Physical Exam Physical Exam  Constitutional: He is oriented to person, place, and time. He appears well-developed and well-nourished.  Abdominal: No hernia.  Right  inguinal incision looks clean and healing well.   Neurological: He is alert and oriented to person, place, and time.  Skin: Skin is warm and dry.       Assessment    Doing well status post right indirect inguinal hernia repair.    Plan    Patient to return as needed.. Proper lifting techniques reviewed.     PCP:  Paulina Fusi 01/14/2015, 9:23 PM

## 2015-01-20 NOTE — Op Note (Signed)
PATIENT NAME:  Seth Stewart, Seth Stewart MR#:  [id number] DATE OF BIRTH:  05-18-1941  DATE OF PROCEDURE:  01/07/2015  PREOPERATIVE DIAGNOSIS: Right inguinal hernia.   POSTOPERATIVE DIAGNOSIS:   Right inguinal hernia.   OPERATIVE PROCEDURE: Right inguinal hernia repair with medium Ultrapro mesh.   SURGEON: Hervey Ard, MD  ANESTHESIA: General by LMA under Dr. Myra Gianotti, Marcaine 0.5% with 1:200,000 units of epinephrine, 30 mL local infiltration, Toradol 30 mg.   CLINICAL NOTE: This 74 year old active male has developed symptomatic right inguinal hernia. He was admitted for elective repair. Hair was removed with clippers. He received Kefzol by vein prior to the procedure.   OPERATIVE NOTE: With the patient under adequate general anesthesia, the area was prepped with ChloraPrep and draped. Field block anesthesia was established for postoperative analgesia. A 5 cm skin line incision along the anticipated course of the inguinal canal was carried down through the skin and subcutaneous tissue with hemostasis achieved by electrocautery and 3-0 Vicryl ties. The external oblique was opened in the direction of its fibers. The ilioinguinal and iliohypogastric nerves were identified and protected. The cremasteric fibers were split and the cord mobilized. A sizable indirect sac was noted. This was dissected free of cord structures into the preperitoneal space. No direct defect was noted. A medium Ultrapro mesh was smoothed into position behind the transversalis fascia. The external component was anchored to the pubic tubercle with 0 Surgilon. The inferior border was anchored to the inguinal ligament with interrupted 0 Surgilon sutures. The superior and medial borders were anchored to the transverse abdominis aponeurosis in a similar fashion. A lateral slit for cord passage was closed. The nerves were returned to their bed. The external oblique was closed with a running 2-0 Vicryl, Scarpa's fascia was closed with a running  3-0 Vicryl, and the skin closed with a running 4-0 Vicryl subcuticular suture. Benzoin, Steri-Strips, Telfa and Tegaderm dressings were then applied.   The patient tolerated the procedure well and was taken to the recovery room in stable condition.     ____________________________ Robert Bellow, MD jwb:tr D: 01/07/2015 16:11:59 ET T: 01/07/2015 20:23:08 ET JOB#: [id number]  cc: Robert Bellow, MD, <Dictator> Juline Patch, MD Katherina Wimer Amedeo Kinsman MD ELECTRONICALLY SIGNED 01/08/2015 8:42

## 2015-01-21 ENCOUNTER — Ambulatory Visit (INDEPENDENT_AMBULATORY_CARE_PROVIDER_SITE_OTHER): Payer: Medicare Other | Admitting: Ophthalmology

## 2015-01-21 DIAGNOSIS — H35432 Paving stone degeneration of retina, left eye: Secondary | ICD-10-CM | POA: Diagnosis not present

## 2015-01-21 DIAGNOSIS — H35373 Puckering of macula, bilateral: Secondary | ICD-10-CM

## 2015-01-21 DIAGNOSIS — H43813 Vitreous degeneration, bilateral: Secondary | ICD-10-CM | POA: Diagnosis not present

## 2015-02-05 DIAGNOSIS — I10 Essential (primary) hypertension: Secondary | ICD-10-CM | POA: Diagnosis not present

## 2015-02-05 DIAGNOSIS — E780 Pure hypercholesterolemia: Secondary | ICD-10-CM | POA: Diagnosis not present

## 2015-02-12 DIAGNOSIS — M25512 Pain in left shoulder: Secondary | ICD-10-CM | POA: Diagnosis not present

## 2015-02-12 DIAGNOSIS — M7552 Bursitis of left shoulder: Secondary | ICD-10-CM | POA: Diagnosis not present

## 2015-02-19 DIAGNOSIS — M47896 Other spondylosis, lumbar region: Secondary | ICD-10-CM | POA: Diagnosis not present

## 2015-02-19 DIAGNOSIS — M4806 Spinal stenosis, lumbar region: Secondary | ICD-10-CM | POA: Diagnosis not present

## 2015-02-19 DIAGNOSIS — M4316 Spondylolisthesis, lumbar region: Secondary | ICD-10-CM | POA: Diagnosis not present

## 2015-02-19 DIAGNOSIS — M47816 Spondylosis without myelopathy or radiculopathy, lumbar region: Secondary | ICD-10-CM | POA: Diagnosis not present

## 2015-03-08 ENCOUNTER — Other Ambulatory Visit: Payer: Self-pay

## 2015-03-08 DIAGNOSIS — N528 Other male erectile dysfunction: Secondary | ICD-10-CM

## 2015-03-08 MED ORDER — TADALAFIL 20 MG PO TABS: 20.0000 mg | ORAL_TABLET | Freq: Every day | ORAL | Status: DC | PRN

## 2015-03-11 DIAGNOSIS — M4726 Other spondylosis with radiculopathy, lumbar region: Secondary | ICD-10-CM | POA: Diagnosis not present

## 2015-03-11 DIAGNOSIS — M4806 Spinal stenosis, lumbar region: Secondary | ICD-10-CM | POA: Diagnosis not present

## 2015-03-13 ENCOUNTER — Other Ambulatory Visit: Payer: Self-pay | Admitting: Family Medicine

## 2015-03-13 DIAGNOSIS — E785 Hyperlipidemia, unspecified: Secondary | ICD-10-CM

## 2015-03-18 DIAGNOSIS — Z79899 Other long term (current) drug therapy: Secondary | ICD-10-CM | POA: Diagnosis not present

## 2015-03-18 DIAGNOSIS — M4726 Other spondylosis with radiculopathy, lumbar region: Secondary | ICD-10-CM | POA: Diagnosis not present

## 2015-03-18 DIAGNOSIS — Z01818 Encounter for other preprocedural examination: Secondary | ICD-10-CM | POA: Diagnosis not present

## 2015-03-18 DIAGNOSIS — Z5181 Encounter for therapeutic drug level monitoring: Secondary | ICD-10-CM | POA: Diagnosis not present

## 2015-03-27 DIAGNOSIS — M545 Low back pain: Secondary | ICD-10-CM | POA: Diagnosis not present

## 2015-03-27 DIAGNOSIS — M4806 Spinal stenosis, lumbar region: Secondary | ICD-10-CM | POA: Diagnosis not present

## 2015-03-27 DIAGNOSIS — M5416 Radiculopathy, lumbar region: Secondary | ICD-10-CM | POA: Diagnosis not present

## 2015-03-27 DIAGNOSIS — I4891 Unspecified atrial fibrillation: Secondary | ICD-10-CM | POA: Diagnosis not present

## 2015-03-27 DIAGNOSIS — Z7901 Long term (current) use of anticoagulants: Secondary | ICD-10-CM | POA: Diagnosis not present

## 2015-03-27 DIAGNOSIS — E785 Hyperlipidemia, unspecified: Secondary | ICD-10-CM | POA: Diagnosis not present

## 2015-03-27 DIAGNOSIS — I480 Paroxysmal atrial fibrillation: Secondary | ICD-10-CM | POA: Diagnosis not present

## 2015-03-27 DIAGNOSIS — Z0189 Encounter for other specified special examinations: Secondary | ICD-10-CM | POA: Diagnosis not present

## 2015-03-27 DIAGNOSIS — M4726 Other spondylosis with radiculopathy, lumbar region: Secondary | ICD-10-CM | POA: Diagnosis not present

## 2015-03-28 DIAGNOSIS — Z7901 Long term (current) use of anticoagulants: Secondary | ICD-10-CM | POA: Diagnosis not present

## 2015-03-28 DIAGNOSIS — M4806 Spinal stenosis, lumbar region: Secondary | ICD-10-CM | POA: Diagnosis not present

## 2015-03-28 DIAGNOSIS — M4726 Other spondylosis with radiculopathy, lumbar region: Secondary | ICD-10-CM | POA: Diagnosis not present

## 2015-03-28 DIAGNOSIS — E785 Hyperlipidemia, unspecified: Secondary | ICD-10-CM | POA: Diagnosis not present

## 2015-03-28 DIAGNOSIS — I4891 Unspecified atrial fibrillation: Secondary | ICD-10-CM | POA: Diagnosis not present

## 2015-03-28 DIAGNOSIS — M5416 Radiculopathy, lumbar region: Secondary | ICD-10-CM | POA: Diagnosis not present

## 2015-03-28 DIAGNOSIS — R4189 Other symptoms and signs involving cognitive functions and awareness: Secondary | ICD-10-CM | POA: Diagnosis not present

## 2015-03-28 DIAGNOSIS — I480 Paroxysmal atrial fibrillation: Secondary | ICD-10-CM | POA: Diagnosis not present

## 2015-03-29 DIAGNOSIS — R4189 Other symptoms and signs involving cognitive functions and awareness: Secondary | ICD-10-CM | POA: Diagnosis not present

## 2015-03-29 DIAGNOSIS — E785 Hyperlipidemia, unspecified: Secondary | ICD-10-CM | POA: Diagnosis not present

## 2015-03-29 DIAGNOSIS — M5416 Radiculopathy, lumbar region: Secondary | ICD-10-CM | POA: Diagnosis not present

## 2015-03-29 DIAGNOSIS — D72829 Elevated white blood cell count, unspecified: Secondary | ICD-10-CM | POA: Diagnosis not present

## 2015-03-29 DIAGNOSIS — Z7901 Long term (current) use of anticoagulants: Secondary | ICD-10-CM | POA: Diagnosis not present

## 2015-03-29 DIAGNOSIS — M4806 Spinal stenosis, lumbar region: Secondary | ICD-10-CM | POA: Diagnosis not present

## 2015-03-29 DIAGNOSIS — I4891 Unspecified atrial fibrillation: Secondary | ICD-10-CM | POA: Diagnosis not present

## 2015-03-29 DIAGNOSIS — I480 Paroxysmal atrial fibrillation: Secondary | ICD-10-CM | POA: Diagnosis not present

## 2015-03-29 DIAGNOSIS — M4726 Other spondylosis with radiculopathy, lumbar region: Secondary | ICD-10-CM | POA: Diagnosis not present

## 2015-04-03 DIAGNOSIS — E782 Mixed hyperlipidemia: Secondary | ICD-10-CM | POA: Diagnosis not present

## 2015-04-03 DIAGNOSIS — I480 Paroxysmal atrial fibrillation: Secondary | ICD-10-CM | POA: Diagnosis not present

## 2015-06-27 DIAGNOSIS — Z23 Encounter for immunization: Secondary | ICD-10-CM | POA: Diagnosis not present

## 2015-06-28 ENCOUNTER — Other Ambulatory Visit: Payer: Self-pay

## 2015-07-01 DIAGNOSIS — E782 Mixed hyperlipidemia: Secondary | ICD-10-CM | POA: Diagnosis not present

## 2015-07-01 DIAGNOSIS — I4891 Unspecified atrial fibrillation: Secondary | ICD-10-CM | POA: Diagnosis not present

## 2015-07-23 DIAGNOSIS — C44529 Squamous cell carcinoma of skin of other part of trunk: Secondary | ICD-10-CM | POA: Diagnosis not present

## 2015-07-23 DIAGNOSIS — D045 Carcinoma in situ of skin of trunk: Secondary | ICD-10-CM | POA: Diagnosis not present

## 2015-08-13 DIAGNOSIS — C44529 Squamous cell carcinoma of skin of other part of trunk: Secondary | ICD-10-CM | POA: Diagnosis not present

## 2015-08-13 DIAGNOSIS — L905 Scar conditions and fibrosis of skin: Secondary | ICD-10-CM | POA: Diagnosis not present

## 2015-09-10 ENCOUNTER — Other Ambulatory Visit: Payer: Self-pay

## 2015-09-10 DIAGNOSIS — E785 Hyperlipidemia, unspecified: Secondary | ICD-10-CM

## 2015-09-10 MED ORDER — ROSUVASTATIN CALCIUM 5 MG PO TABS: 5.0000 mg | ORAL_TABLET | Freq: Every day | ORAL | Status: DC

## 2015-09-13 DIAGNOSIS — M79671 Pain in right foot: Secondary | ICD-10-CM | POA: Diagnosis not present

## 2015-10-06 ENCOUNTER — Other Ambulatory Visit: Payer: Self-pay | Admitting: Family Medicine

## 2015-10-06 IMAGING — CT CT ABD-PELV W/ CM
2 of 5 series · 16 of 46 positions shown, 18 images · IV contrast (omnipaque)
Comparison: None.

CLINICAL DATA: Pelvic pain which is been worsening recently.

EXAM:
CT ABDOMEN AND PELVIS WITH CONTRAST
TECHNIQUE: Multidetector CT imaging of the abdomen and pelvis was performed
using the standard protocol following bolus administration of
intravenous contrast.
CONTRAST:  100 cc Omnipaque 300

[Series 2: routine with · axial · 0.77mm/px · z∈[-960,-544]mm · 13 of 95 slices shown, 15 images]
[im 6/95  soft-tissue]
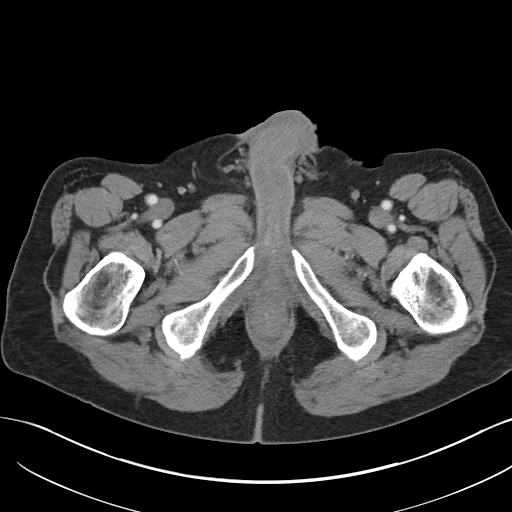
[im 6/95  bone]
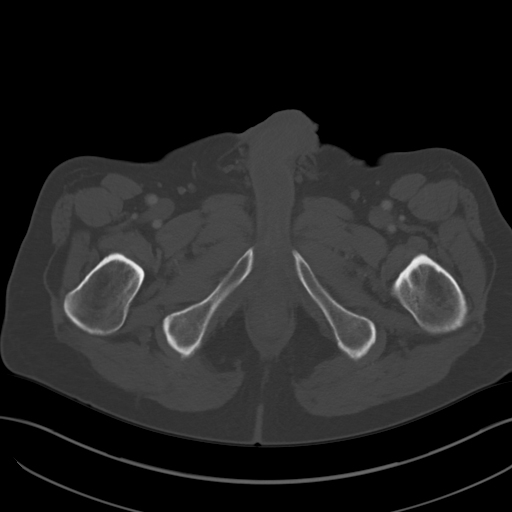
[im 11/95  soft-tissue]
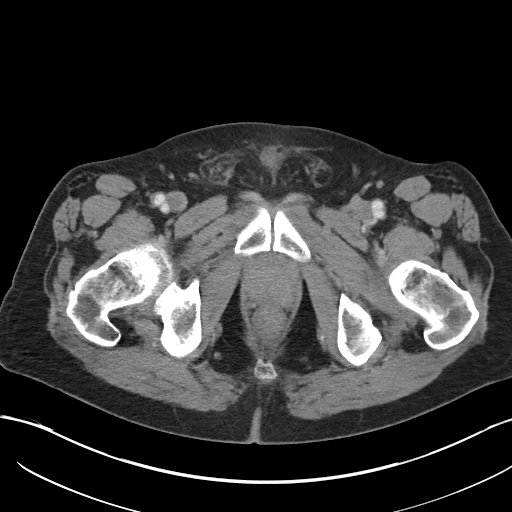
[im 21/95  soft-tissue]
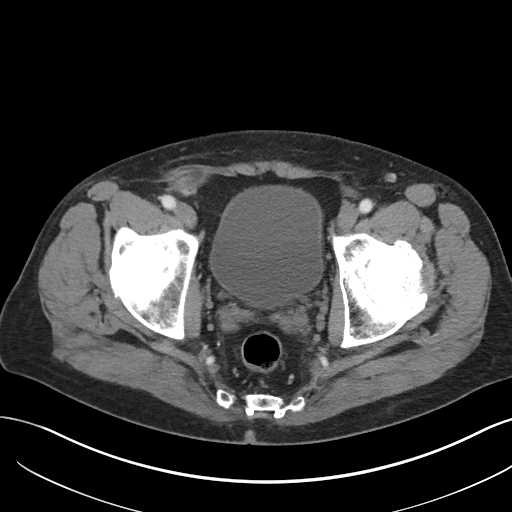
[im 27/95  soft-tissue]
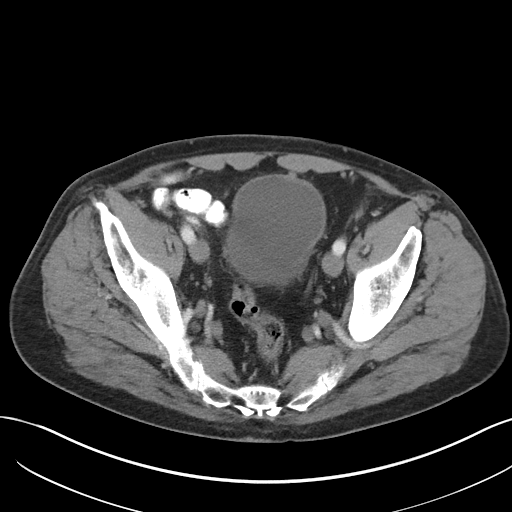
[im 32/95  soft-tissue]
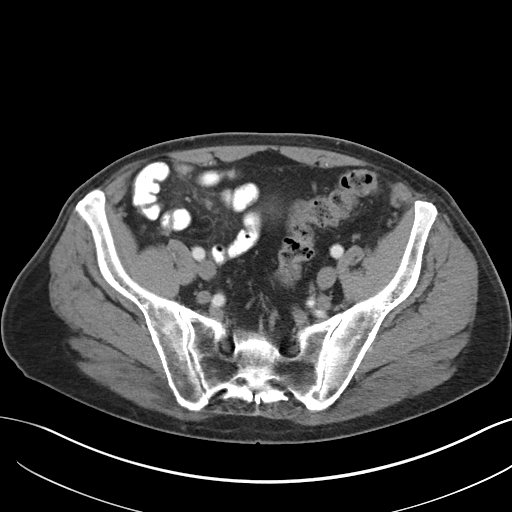
[im 42/95  soft-tissue]
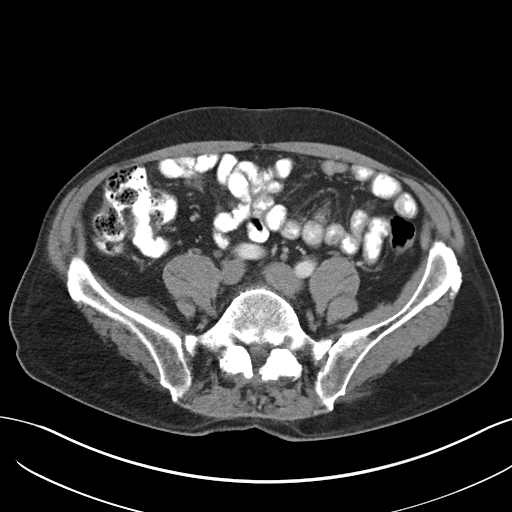
[im 48/95  soft-tissue]
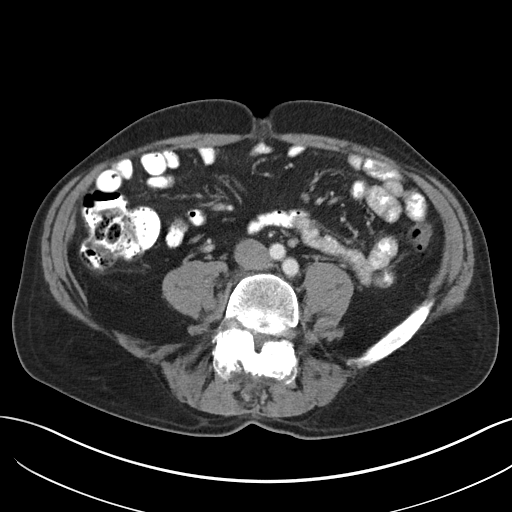
[im 53/95  soft-tissue]
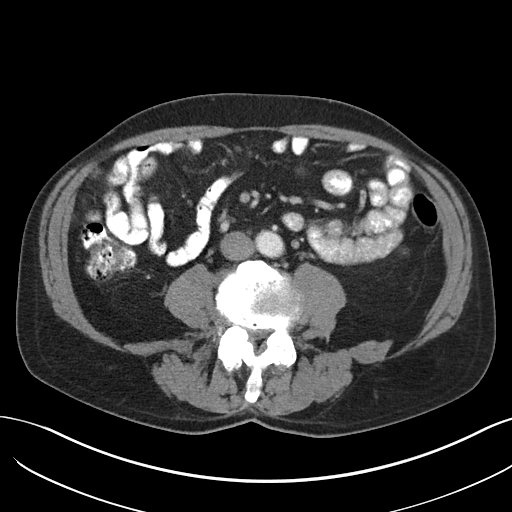
[im 63/95  soft-tissue]
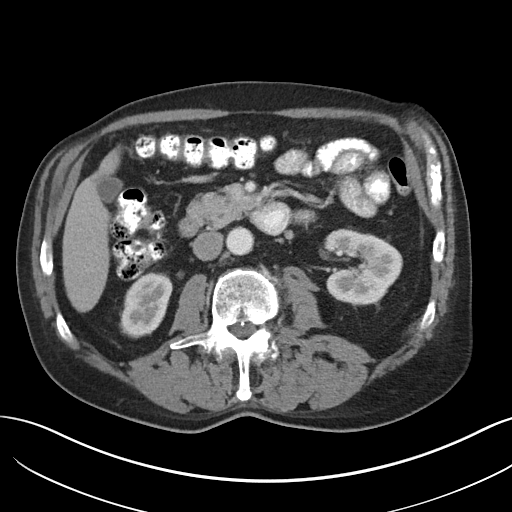
[im 63/95  bone]
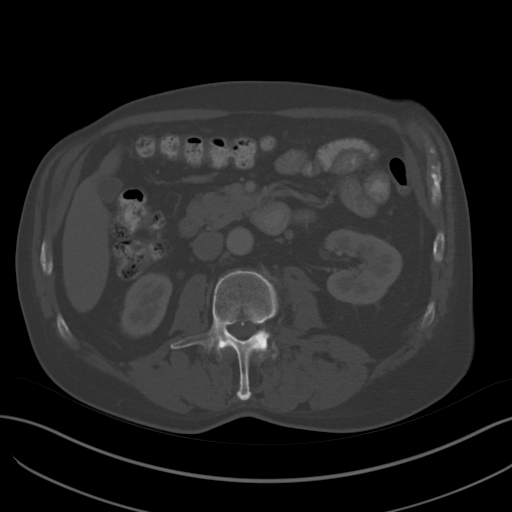
[im 68/95  soft-tissue]
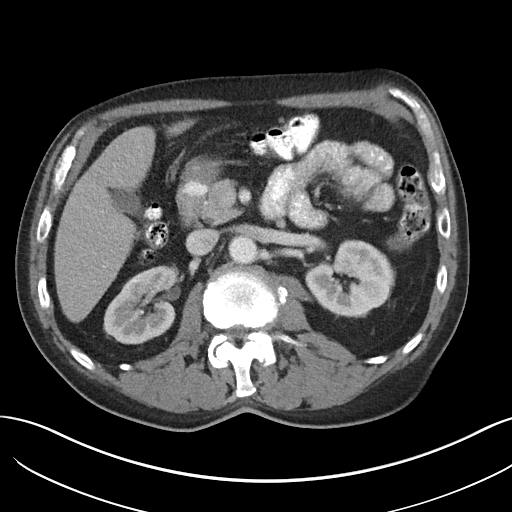
[im 74/95  soft-tissue]
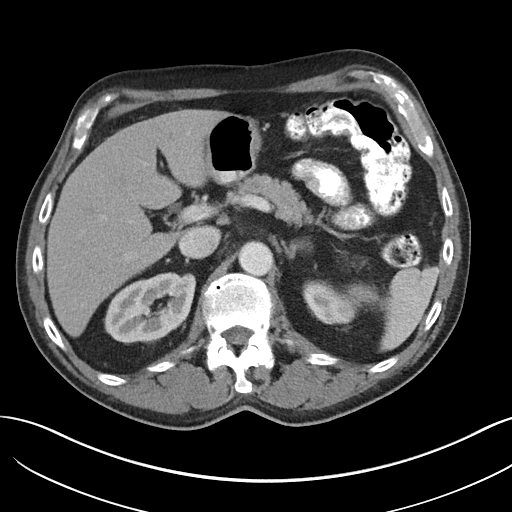
[im 84/95  soft-tissue]
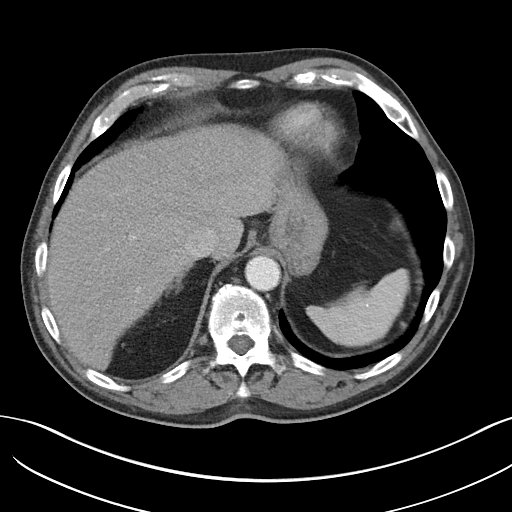
[im 89/95  soft-tissue]
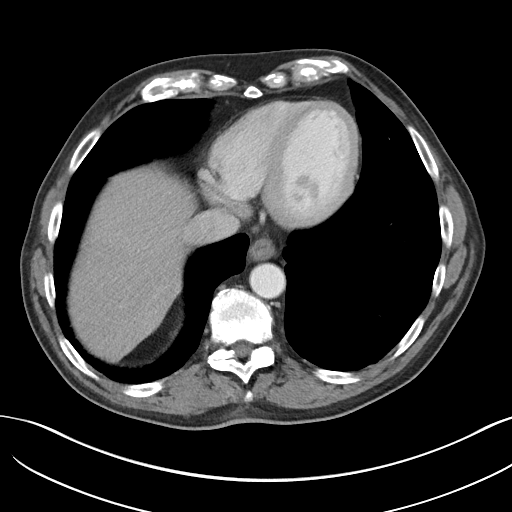

[Series 6: cor routine with · coronal · 0.78mm/px · 3 of 151 slices shown]
[im 51/151  soft-tissue]
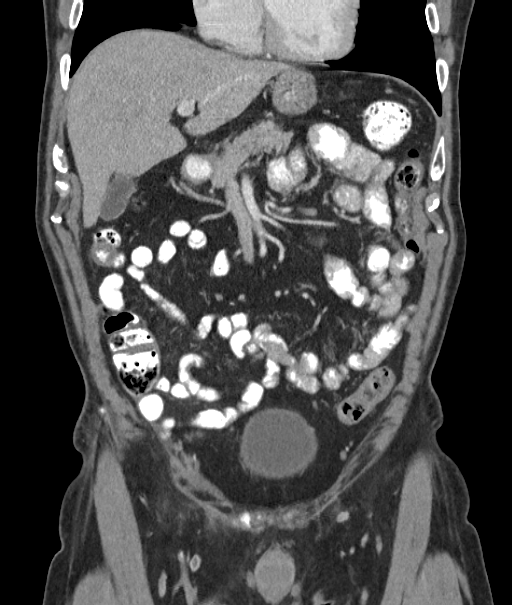
[im 67/151  soft-tissue]
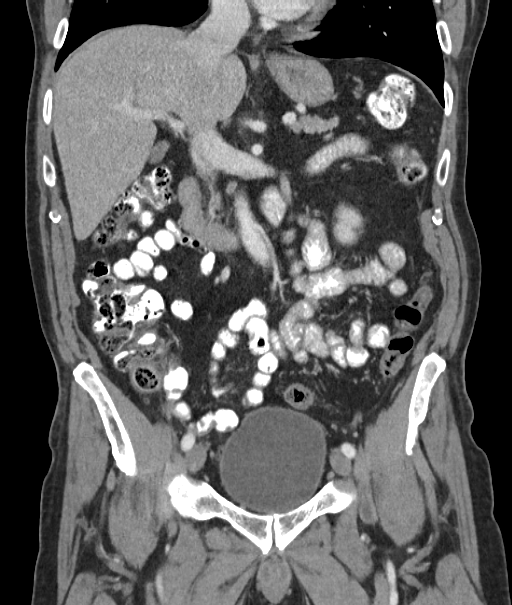
[im 84/151  soft-tissue]
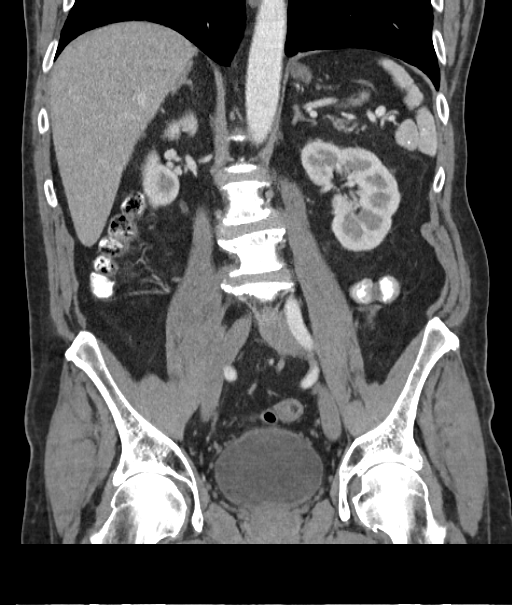

[16 of 46 positions shown; findings below may reference images not displayed]

FINDINGS: The lung bases are clear. The liver enhances with no focal
abnormality and no ductal dilatation is seen. No calcified
gallstones are noted within the contracted gallbladder. The pancreas
is normal in size and the pancreatic duct is not dilated. The
adrenal glands and spleen are unremarkable other than several
smaller calcified splenic granulomas. The stomach is not distended.
The kidneys enhance with no calculus or mass, and on delayed images,
the pelvocaliceal systems are unremarkable. The proximal ureters are
normal in caliber. The abdominal aorta is normal in caliber. No
adenopathy is seen.

The urinary bladder is moderately urine distended with no
abnormality noted. The prostate is normal in size. No abnormality of
the colon is seen. The terminal ileum is unremarkable, as is the
appendix. However, there is fat entering the inguinal canals right
greater than left consistent with small inguinal hernias right
larger than left. In addition there appear to be somewhat prominent
vessels in the upper scrotum and varicocele would be a
consideration. Moderate degenerative joint disease of both hips is
present. Degenerative disc disease is present primarily at L3-4,
L4-5, and L5-S1 levels with degenerative change throughout the facet
joints of these levels as well.
IMPRESSION: 1. Small bilateral inguinal hernias right larger than left. Fat
enters these hernias, but no bowel is seen to enter either hernia.
2. The appendix and terminal ileum are unremarkable.
3. Somewhat prominent vessels noted in the upper scrotum. Cannot
exclude varicocele.
4. Diffuse degenerative disc disease in the lumbar spine as noted
above.

## 2015-10-08 DIAGNOSIS — M722 Plantar fascial fibromatosis: Secondary | ICD-10-CM | POA: Diagnosis not present

## 2015-11-06 ENCOUNTER — Other Ambulatory Visit: Payer: Self-pay

## 2015-11-11 ENCOUNTER — Ambulatory Visit (INDEPENDENT_AMBULATORY_CARE_PROVIDER_SITE_OTHER): Payer: Medicare Other | Admitting: Family Medicine

## 2015-11-11 ENCOUNTER — Encounter: Payer: Self-pay | Admitting: Family Medicine

## 2015-11-11 VITALS — BP 124/80 | HR 76 | Ht 75.0 in | Wt 211.0 lb

## 2015-11-11 DIAGNOSIS — E785 Hyperlipidemia, unspecified: Secondary | ICD-10-CM | POA: Diagnosis not present

## 2015-11-11 DIAGNOSIS — Z23 Encounter for immunization: Secondary | ICD-10-CM

## 2015-11-11 DIAGNOSIS — I10 Essential (primary) hypertension: Secondary | ICD-10-CM

## 2015-11-11 DIAGNOSIS — Z0000 Encounter for general adult medical examination without abnormal findings: Secondary | ICD-10-CM | POA: Diagnosis not present

## 2015-11-11 DIAGNOSIS — Z79899 Other long term (current) drug therapy: Secondary | ICD-10-CM | POA: Diagnosis not present

## 2015-11-11 MED ORDER — ROSUVASTATIN CALCIUM 5 MG PO TABS: 5.0000 mg | ORAL_TABLET | Freq: Every day | ORAL | Status: DC

## 2015-11-11 NOTE — Progress Notes (Signed)
Name: Seth Ramp Sr.   MRN: [id number]    DOB: 10-04-1940   Date:11/11/2015       Progress Note  Subjective  Chief Complaint  Chief Complaint  Patient presents with  . Annual Exam  . Hyperlipidemia    crestor refill    HPI Comments: Patient presents for annual physical exam.  Hyperlipidemia This is a chronic problem. The current episode started more than 1 year ago. The problem is controlled. Recent lipid tests were reviewed and are normal. He has no history of chronic renal disease, diabetes, hypothyroidism, liver disease, obesity or nephrotic syndrome. There are no known factors aggravating his hyperlipidemia. Pertinent negatives include no chest pain, focal sensory loss, focal weakness, leg pain, myalgias or shortness of breath. Current antihyperlipidemic treatment includes statins. The current treatment provides mild improvement of lipids. There are no compliance problems.  Risk factors for coronary artery disease include hypertension.    No problem-specific assessment & plan notes found for this encounter.   Past Medical History  Diagnosis Date  . Hyperlipidemia   . AF (atrial fibrillation) (HCC)   . Hemorrhoids   . Back pain   . Hypertension     Past Surgical History  Procedure Laterality Date  . Eye surgery  2011  . Laminectomy  2004  . Colonoscopy  12/06/2014    [name]  . Ablation    . Hernia repair Right 01/07/15    right inguinal repair, medium ultra Pro mesh    Family History  Problem Relation Age of Onset  . Cancer Mother   . Heart disease Father     Social History   Social History  . Marital Status: Married    Spouse Name: N/A  . Number of Children: N/A  . Years of Education: N/A   Occupational History  . Not on file.   Social History Main Topics  . Smoking status: Never Smoker   . Smokeless tobacco: Not on file  . Alcohol Use: 1.2 oz/week    2 Standard drinks or equivalent per week  . Drug Use: No  . Sexual Activity: Yes   Other  Topics Concern  . Not on file   Social History Narrative    No Known Allergies   Review of Systems  Constitutional: Negative for fever, chills, weight loss and malaise/fatigue.  HENT: Negative for ear discharge, ear pain and sore throat.   Eyes: Negative for blurred vision.  Respiratory: Negative for cough, sputum production, shortness of breath and wheezing.   Cardiovascular: Negative for chest pain, palpitations and leg swelling.  Gastrointestinal: Negative for heartburn, nausea, abdominal pain, diarrhea, constipation, blood in stool and melena.  Genitourinary: Negative for dysuria, urgency, frequency and hematuria.  Musculoskeletal: Negative for myalgias, back pain, joint pain and neck pain.  Skin: Negative for rash.  Neurological: Negative for dizziness, tingling, sensory change, focal weakness and headaches.  Endo/Heme/Allergies: Negative for environmental allergies and polydipsia. Does not bruise/bleed easily.  Psychiatric/Behavioral: Negative for depression and suicidal ideas. The patient is not nervous/anxious and does not have insomnia.      Objective  Filed Vitals:   11/11/15 0945  BP: 124/80  Pulse: 76  Height: 6\' 3"  (1.905 m)  Weight: 211 lb (95.709 kg)    Physical Exam  Constitutional: He is oriented to person, place, and time and well-developed, well-nourished, and in no distress.  HENT:  Head: Normocephalic.  Right Ear: External ear normal.  Left Ear: External ear normal.  Nose: Nose normal.  Mouth/Throat:  Oropharynx is clear and moist.  Eyes: Conjunctivae and EOM are normal. Pupils are equal, round, and reactive to light. Right eye exhibits no discharge. Left eye exhibits no discharge. No scleral icterus.  Neck: Normal range of motion. Neck supple. No JVD present. No tracheal deviation present. No thyromegaly present.  Cardiovascular: Normal rate, regular rhythm, normal heart sounds and intact distal pulses.  Exam reveals no gallop and no friction rub.    No murmur heard. Pulmonary/Chest: Breath sounds normal. No respiratory distress. He has no wheezes. He has no rales.  Abdominal: Soft. Bowel sounds are normal. He exhibits no mass. There is no hepatosplenomegaly. There is no tenderness. There is no rebound, no guarding and no CVA tenderness.  Genitourinary: Rectum normal and prostate normal. Guaiac negative stool.  Musculoskeletal: Normal range of motion. He exhibits no edema or tenderness.  Lymphadenopathy:    He has no cervical adenopathy.  Neurological: He is alert and oriented to person, place, and time. He has normal sensation, normal strength, normal reflexes and intact cranial nerves. No cranial nerve deficit.  Skin: Skin is warm. No rash noted.  Psychiatric: Mood and affect normal.  Nursing note and vitals reviewed.     Assessment & Plan  Problem List Items Addressed This Visit    None    Visit Diagnoses    Annual physical exam    -  Primary    Need for Tdap vaccination        Relevant Orders    Tdap vaccine greater than or equal to 7yo IM (Completed)    Hyperlipidemia        Relevant Medications    amiodarone (PACERONE) 200 MG tablet    rosuvastatin (CRESTOR) 5 MG tablet    Other Relevant Orders    Lipid Profile    Essential hypertension        Relevant Medications    amiodarone (PACERONE) 200 MG tablet    rosuvastatin (CRESTOR) 5 MG tablet    Other Relevant Orders    Renal Function Panel    Medication management        Relevant Orders    Hepatic function panel      Given RX for [place]    Dr. Hay[facility]  11/11/2015

## 2015-11-12 LAB — RENAL FUNCTION PANEL
ALBUMIN: 4.2 g/dL (ref 3.5–4.8)
BUN/Creatinine Ratio: 11 (ref 10–22)
BUN: 10 mg/dL (ref 8–27)
CALCIUM: 9.2 mg/dL (ref 8.6–10.2)
CHLORIDE: 103 mmol/L (ref 96–106)
CO2: 25 mmol/L (ref 18–29)
Creatinine, Ser: 0.9 mg/dL (ref 0.76–1.27)
GFR calc Af Amer: 96 mL/min/{1.73_m2} (ref >59)
GFR, EST NON AFRICAN AMERICAN: 83 mL/min/{1.73_m2} (ref >59)
Glucose: 90 mg/dL (ref 65–99)
PHOSPHORUS: 3.3 mg/dL (ref 2.5–4.5)
POTASSIUM: 4.4 mmol/L (ref 3.5–5.2)
SODIUM: 142 mmol/L (ref 134–144)

## 2015-11-12 LAB — HEPATIC FUNCTION PANEL
ALT: 13 IU/L (ref 0–44)
AST: 17 IU/L (ref 0–40)
Alkaline Phosphatase: 45 IU/L (ref 39–117)
BILIRUBIN TOTAL: 0.8 mg/dL (ref 0.0–1.2)
BILIRUBIN, DIRECT: 0.2 mg/dL (ref 0.00–0.40)
Total Protein: 6.9 g/dL (ref 6.0–8.5)

## 2015-11-12 LAB — LIPID PANEL
CHOLESTEROL TOTAL: 181 mg/dL (ref 100–199)
Chol/HDL Ratio: 2.2 ratio units (ref 0.0–5.0)
HDL: 84 mg/dL (ref >39)
LDL CALC: 85 mg/dL (ref 0–99)
Triglycerides: 61 mg/dL (ref 0–149)
VLDL Cholesterol Cal: 12 mg/dL (ref 5–40)

## 2015-11-14 DIAGNOSIS — Z85828 Personal history of other malignant neoplasm of skin: Secondary | ICD-10-CM | POA: Diagnosis not present

## 2015-11-14 DIAGNOSIS — L570 Actinic keratosis: Secondary | ICD-10-CM | POA: Diagnosis not present

## 2015-11-14 DIAGNOSIS — X32XXXA Exposure to sunlight, initial encounter: Secondary | ICD-10-CM | POA: Diagnosis not present

## 2015-11-14 DIAGNOSIS — L821 Other seborrheic keratosis: Secondary | ICD-10-CM | POA: Diagnosis not present

## 2015-11-27 DIAGNOSIS — I4891 Unspecified atrial fibrillation: Secondary | ICD-10-CM | POA: Diagnosis not present

## 2015-11-29 ENCOUNTER — Encounter: Payer: Self-pay | Admitting: *Deleted

## 2016-01-23 ENCOUNTER — Ambulatory Visit (INDEPENDENT_AMBULATORY_CARE_PROVIDER_SITE_OTHER): Payer: Medicare Other | Admitting: Ophthalmology

## 2016-01-23 DIAGNOSIS — H35373 Puckering of macula, bilateral: Secondary | ICD-10-CM

## 2016-01-23 DIAGNOSIS — H43813 Vitreous degeneration, bilateral: Secondary | ICD-10-CM | POA: Diagnosis not present

## 2016-01-29 DIAGNOSIS — E782 Mixed hyperlipidemia: Secondary | ICD-10-CM | POA: Diagnosis not present

## 2016-01-29 DIAGNOSIS — I480 Paroxysmal atrial fibrillation: Secondary | ICD-10-CM | POA: Diagnosis not present

## 2016-04-07 DIAGNOSIS — H35373 Puckering of macula, bilateral: Secondary | ICD-10-CM | POA: Diagnosis not present

## 2016-04-07 DIAGNOSIS — Z961 Presence of intraocular lens: Secondary | ICD-10-CM | POA: Diagnosis not present

## 2016-04-30 ENCOUNTER — Other Ambulatory Visit: Payer: Self-pay

## 2016-06-16 DIAGNOSIS — Z23 Encounter for immunization: Secondary | ICD-10-CM | POA: Diagnosis not present

## 2016-06-18 ENCOUNTER — Other Ambulatory Visit: Payer: Self-pay

## 2016-07-13 DIAGNOSIS — L821 Other seborrheic keratosis: Secondary | ICD-10-CM | POA: Diagnosis not present

## 2016-07-13 DIAGNOSIS — Z08 Encounter for follow-up examination after completed treatment for malignant neoplasm: Secondary | ICD-10-CM | POA: Diagnosis not present

## 2016-07-13 DIAGNOSIS — X32XXXA Exposure to sunlight, initial encounter: Secondary | ICD-10-CM | POA: Diagnosis not present

## 2016-07-13 DIAGNOSIS — Z85828 Personal history of other malignant neoplasm of skin: Secondary | ICD-10-CM | POA: Diagnosis not present

## 2016-07-13 DIAGNOSIS — D1801 Hemangioma of skin and subcutaneous tissue: Secondary | ICD-10-CM | POA: Diagnosis not present

## 2016-07-13 DIAGNOSIS — L570 Actinic keratosis: Secondary | ICD-10-CM | POA: Diagnosis not present

## 2016-07-29 DIAGNOSIS — I4891 Unspecified atrial fibrillation: Secondary | ICD-10-CM | POA: Diagnosis not present

## 2016-07-29 DIAGNOSIS — Z5181 Encounter for therapeutic drug level monitoring: Secondary | ICD-10-CM | POA: Diagnosis not present

## 2016-07-29 DIAGNOSIS — Z79899 Other long term (current) drug therapy: Secondary | ICD-10-CM | POA: Diagnosis not present

## 2016-07-29 DIAGNOSIS — E782 Mixed hyperlipidemia: Secondary | ICD-10-CM | POA: Diagnosis not present

## 2016-08-04 DIAGNOSIS — Z79899 Other long term (current) drug therapy: Secondary | ICD-10-CM | POA: Diagnosis not present

## 2016-08-10 ENCOUNTER — Other Ambulatory Visit: Payer: Self-pay | Admitting: Family Medicine

## 2016-09-02 ENCOUNTER — Other Ambulatory Visit: Payer: Self-pay | Admitting: Family Medicine

## 2016-09-28 ENCOUNTER — Other Ambulatory Visit: Payer: Self-pay | Admitting: Family Medicine

## 2016-10-14 ENCOUNTER — Other Ambulatory Visit: Payer: Self-pay | Admitting: Family Medicine

## 2016-10-15 ENCOUNTER — Other Ambulatory Visit: Payer: Self-pay

## 2016-10-15 MED ORDER — ROSUVASTATIN CALCIUM 5 MG PO TABS: 5.0000 mg | ORAL_TABLET | Freq: Every day | ORAL | 0 refills | Status: DC

## 2016-11-23 ENCOUNTER — Encounter: Payer: Self-pay | Admitting: Family Medicine

## 2016-11-23 ENCOUNTER — Ambulatory Visit (INDEPENDENT_AMBULATORY_CARE_PROVIDER_SITE_OTHER): Payer: Medicare Other | Admitting: Family Medicine

## 2016-11-23 VITALS — BP 120/78 | HR 68 | Ht 75.0 in | Wt 205.0 lb

## 2016-11-23 DIAGNOSIS — Z1211 Encounter for screening for malignant neoplasm of colon: Secondary | ICD-10-CM | POA: Diagnosis not present

## 2016-11-23 DIAGNOSIS — Z0000 Encounter for general adult medical examination without abnormal findings: Secondary | ICD-10-CM

## 2016-11-23 DIAGNOSIS — E785 Hyperlipidemia, unspecified: Secondary | ICD-10-CM | POA: Diagnosis not present

## 2016-11-23 DIAGNOSIS — N401 Enlarged prostate with lower urinary tract symptoms: Secondary | ICD-10-CM | POA: Diagnosis not present

## 2016-11-23 LAB — HEMOCCULT GUIAC POC 1CARD (OFFICE): Fecal Occult Blood, POC: NEGATIVE

## 2016-11-23 MED ORDER — ROSUVASTATIN CALCIUM 5 MG PO TABS: 5.0000 mg | ORAL_TABLET | Freq: Every day | ORAL | 3 refills | Status: DC

## 2016-11-23 NOTE — Progress Notes (Signed)
Name: Seth Presti Sr.   MRN: [id number]    DOB: 12/15/1940   Date:11/23/2016       Progress Note  Subjective  Chief Complaint  Chief Complaint  Patient presents with  . Annual Exam  . Hyperlipidemia    Patient presents for annual physical exam.    Hyperlipidemia  This is a chronic problem. The current episode started more than 1 year ago. The problem is controlled. Recent lipid tests were reviewed and are normal. There are no known factors aggravating his hyperlipidemia. Pertinent negatives include no chest pain, focal sensory loss, focal weakness, leg pain, myalgias or shortness of breath. Current antihyperlipidemic treatment includes statins. The current treatment provides moderate improvement of lipids. There are no compliance problems.  Risk factors for coronary artery disease include dyslipidemia.    No problem-specific Assessment & Plan notes found for this encounter.   Past Medical History:  Diagnosis Date  . AF (atrial fibrillation) (HCC)   . Back pain   . Hemorrhoids   . Hyperlipidemia   . Hypertension     Past Surgical History:  Procedure Laterality Date  . ABLATION    . COLONOSCOPY  12/06/2014   [name]  . EYE SURGERY  2011  . HERNIA REPAIR Right 01/07/15   right inguinal repair, medium ultra Pro mesh  . LAMINECTOMY  2004    Family History  Problem Relation Age of Onset  . Cancer Mother   . Heart disease Father     Social History   Social History  . Marital status: Married    Spouse name: N/A  . Number of children: N/A  . Years of education: N/A   Occupational History  . Not on file.   Social History Main Topics  . Smoking status: Never Smoker  . Smokeless tobacco: Never Used  . Alcohol use 1.2 oz/week    2 Standard drinks or equivalent per week  . Drug use: No  . Sexual activity: Yes   Other Topics Concern  . Not on file   Social History Narrative  . No narrative on file    No Known Allergies  Outpatient Medications Prior to  Visit  Medication Sig Dispense Refill  . amiodarone (PACERONE) 200 MG tablet Take 0.5 tablets by mouth 2 (two) times daily. Dr Lady Gary  0  . Ascorbic Acid (VITAMIN C) 1000 MG tablet Take 1,000 mg by mouth daily.    Marland Kitchen aspirin 81 MG tablet Take 81 mg by mouth daily.    . Flaxseed, Linseed, (FLAX SEEDS PO) Take by mouth.    . metoprolol tartrate (LOPRESSOR) 25 MG tablet Take 25 mg by mouth 2 (two) times daily. Dr Lady Gary  0  . Multiple Vitamins-Minerals (MULTIVITAMIN WITH MINERALS) tablet Take 1 tablet by mouth daily.    . vitamin E 400 UNIT capsule Take 400 Units by mouth daily.    . rosuvastatin (CRESTOR) 5 MG tablet Take 1 tablet (5 mg total) by mouth daily. May have 30 days until CPE in Feb 30 tablet 0   No facility-administered medications prior to visit.     Review of Systems  Constitutional: Negative for chills, fever, malaise/fatigue and weight loss.  HENT: Negative for ear discharge, ear pain and sore throat.   Eyes: Negative for blurred vision.  Respiratory: Negative for cough, sputum production, shortness of breath and wheezing.   Cardiovascular: Negative for chest pain, palpitations and leg swelling.  Gastrointestinal: Negative for abdominal pain, blood in stool, constipation, diarrhea, heartburn, melena and  nausea.  Genitourinary: Negative for dysuria, frequency, hematuria and urgency.  Musculoskeletal: Negative for back pain, joint pain, myalgias and neck pain.  Skin: Negative for rash.  Neurological: Negative for dizziness, tingling, sensory change, focal weakness and headaches.  Endo/Heme/Allergies: Negative for environmental allergies and polydipsia. Does not bruise/bleed easily.  Psychiatric/Behavioral: Negative for depression and suicidal ideas. The patient is not nervous/anxious and does not have insomnia.      Objective  Vitals:   11/23/16 0959  BP: 120/78  Pulse: 68  Weight: 205 lb (93 kg)  Height: 6\' 3"  (1.905 m)    Physical Exam  Constitutional: He is oriented  to person, place, and time and well-developed, well-nourished, and in no distress.  HENT:  Head: Normocephalic.  Right Ear: External ear normal.  Left Ear: External ear normal.  Nose: Nose normal.  Mouth/Throat: Oropharynx is clear and moist.  Eyes: Conjunctivae and EOM are normal. Pupils are equal, round, and reactive to light. Right eye exhibits no discharge. Left eye exhibits no discharge. No scleral icterus.  Neck: Normal range of motion. Neck supple. No JVD present. No tracheal deviation present. No thyromegaly present.  Cardiovascular: Normal rate, regular rhythm, normal heart sounds and intact distal pulses.  Exam reveals no gallop and no friction rub.   No murmur heard. Pulmonary/Chest: Breath sounds normal. No respiratory distress. He has no wheezes. He has no rales. He exhibits no tenderness. Right breast exhibits no mass. Left breast exhibits no mass.  Abdominal: Soft. Bowel sounds are normal. He exhibits no mass. There is no hepatosplenomegaly. There is no tenderness. There is no rebound, no guarding and no CVA tenderness.  Genitourinary: Rectum normal and prostate normal. Rectal exam shows guaiac negative stool. No discharge found.  Musculoskeletal: Normal range of motion. He exhibits no edema or tenderness.  Lymphadenopathy:    He has no cervical adenopathy.  Neurological: He is alert and oriented to person, place, and time. He has normal sensation, normal strength, normal reflexes and intact cranial nerves. No cranial nerve deficit.  Skin: Skin is warm. No rash noted.  Psychiatric: Mood and affect normal.  Nursing note and vitals reviewed.     Assessment & Plan  Problem List Items Addressed This Visit      Other   HLD (hyperlipidemia)   Relevant Medications   rosuvastatin (CRESTOR) 5 MG tablet   Other Relevant Orders   Lipid panel    Other Visit Diagnoses    Annual physical exam    -  Primary   Colon cancer screening       Relevant Orders   POCT occult blood  stool (Completed)      Meds ordered this encounter  Medications  . rosuvastatin (CRESTOR) 5 MG tablet    Sig: Take 1 tablet (5 mg total) by mouth daily. May have 30 days until CPE in Feb    Dispense:  90 tablet    Refill:  3    Water quality scientist Available.- needs to sched appt- today!!!!- sched appt for meds      Dr. Elizabeth Sauer [facility]  11/23/16

## 2016-11-24 LAB — PSA, TOTAL AND FREE
PSA, Free Pct: 34.3 %
PSA, Free: 0.24 ng/mL
Prostate Specific Ag, Serum: 0.7 ng/mL (ref 0.0–4.0)

## 2016-11-24 LAB — LIPID PANEL
CHOL/HDL RATIO: 2.4 ratio (ref 0.0–5.0)
CHOLESTEROL TOTAL: 186 mg/dL (ref 100–199)
HDL: 78 mg/dL (ref >39)
LDL CALC: 97 mg/dL (ref 0–99)
TRIGLYCERIDES: 56 mg/dL (ref 0–149)
VLDL CHOLESTEROL CAL: 11 mg/dL (ref 5–40)

## 2016-12-24 ENCOUNTER — Other Ambulatory Visit: Payer: Self-pay

## 2017-01-22 ENCOUNTER — Ambulatory Visit (INDEPENDENT_AMBULATORY_CARE_PROVIDER_SITE_OTHER): Payer: Medicare Other | Admitting: Ophthalmology

## 2017-02-01 ENCOUNTER — Ambulatory Visit (INDEPENDENT_AMBULATORY_CARE_PROVIDER_SITE_OTHER): Payer: Medicare Other | Admitting: Ophthalmology

## 2017-03-04 ENCOUNTER — Ambulatory Visit (INDEPENDENT_AMBULATORY_CARE_PROVIDER_SITE_OTHER): Payer: Medicare Other | Admitting: Ophthalmology

## 2017-03-04 DIAGNOSIS — H35373 Puckering of macula, bilateral: Secondary | ICD-10-CM

## 2017-03-04 DIAGNOSIS — H43813 Vitreous degeneration, bilateral: Secondary | ICD-10-CM

## 2017-03-31 DIAGNOSIS — C44329 Squamous cell carcinoma of skin of other parts of face: Secondary | ICD-10-CM | POA: Diagnosis not present

## 2017-03-31 DIAGNOSIS — D235 Other benign neoplasm of skin of trunk: Secondary | ICD-10-CM | POA: Diagnosis not present

## 2017-03-31 DIAGNOSIS — L821 Other seborrheic keratosis: Secondary | ICD-10-CM | POA: Diagnosis not present

## 2017-03-31 DIAGNOSIS — L820 Inflamed seborrheic keratosis: Secondary | ICD-10-CM | POA: Diagnosis not present

## 2017-03-31 DIAGNOSIS — D485 Neoplasm of uncertain behavior of skin: Secondary | ICD-10-CM | POA: Diagnosis not present

## 2017-03-31 DIAGNOSIS — L538 Other specified erythematous conditions: Secondary | ICD-10-CM | POA: Diagnosis not present

## 2017-04-07 DIAGNOSIS — L905 Scar conditions and fibrosis of skin: Secondary | ICD-10-CM | POA: Diagnosis not present

## 2017-04-07 DIAGNOSIS — C44329 Squamous cell carcinoma of skin of other parts of face: Secondary | ICD-10-CM | POA: Diagnosis not present

## 2017-06-21 DIAGNOSIS — Z23 Encounter for immunization: Secondary | ICD-10-CM | POA: Diagnosis not present

## 2017-07-12 DIAGNOSIS — X32XXXA Exposure to sunlight, initial encounter: Secondary | ICD-10-CM | POA: Diagnosis not present

## 2017-07-12 DIAGNOSIS — L814 Other melanin hyperpigmentation: Secondary | ICD-10-CM | POA: Diagnosis not present

## 2017-07-12 DIAGNOSIS — D485 Neoplasm of uncertain behavior of skin: Secondary | ICD-10-CM | POA: Diagnosis not present

## 2017-07-12 DIAGNOSIS — Z85828 Personal history of other malignant neoplasm of skin: Secondary | ICD-10-CM | POA: Diagnosis not present

## 2017-07-12 DIAGNOSIS — L570 Actinic keratosis: Secondary | ICD-10-CM | POA: Diagnosis not present

## 2017-07-12 DIAGNOSIS — L821 Other seborrheic keratosis: Secondary | ICD-10-CM | POA: Diagnosis not present

## 2017-07-12 DIAGNOSIS — C44712 Basal cell carcinoma of skin of right lower limb, including hip: Secondary | ICD-10-CM | POA: Diagnosis not present

## 2017-07-23 ENCOUNTER — Other Ambulatory Visit: Payer: Self-pay

## 2017-08-25 DIAGNOSIS — C44712 Basal cell carcinoma of skin of right lower limb, including hip: Secondary | ICD-10-CM | POA: Diagnosis not present

## 2017-09-29 DIAGNOSIS — I4891 Unspecified atrial fibrillation: Secondary | ICD-10-CM | POA: Diagnosis not present

## 2017-09-29 DIAGNOSIS — J984 Other disorders of lung: Secondary | ICD-10-CM | POA: Diagnosis not present

## 2017-09-29 DIAGNOSIS — I480 Paroxysmal atrial fibrillation: Secondary | ICD-10-CM | POA: Diagnosis not present

## 2017-09-29 DIAGNOSIS — E782 Mixed hyperlipidemia: Secondary | ICD-10-CM | POA: Diagnosis not present

## 2017-10-08 DIAGNOSIS — Z79899 Other long term (current) drug therapy: Secondary | ICD-10-CM | POA: Diagnosis not present

## 2017-10-21 ENCOUNTER — Encounter: Payer: Self-pay | Admitting: Family Medicine

## 2017-10-21 ENCOUNTER — Ambulatory Visit (INDEPENDENT_AMBULATORY_CARE_PROVIDER_SITE_OTHER): Payer: Medicare Other | Admitting: Family Medicine

## 2017-10-21 VITALS — BP 130/70 | HR 72 | Ht 75.0 in | Wt 216.0 lb

## 2017-10-21 DIAGNOSIS — M419 Scoliosis, unspecified: Secondary | ICD-10-CM | POA: Diagnosis not present

## 2017-10-21 DIAGNOSIS — R1904 Left lower quadrant abdominal swelling, mass and lump: Secondary | ICD-10-CM

## 2017-10-21 DIAGNOSIS — Z1211 Encounter for screening for malignant neoplasm of colon: Secondary | ICD-10-CM

## 2017-10-21 DIAGNOSIS — E882 Lipomatosis, not elsewhere classified: Secondary | ICD-10-CM | POA: Diagnosis not present

## 2017-10-21 LAB — HEMOCCULT GUIAC POC 1CARD (OFFICE): FECAL OCCULT BLD: NEGATIVE

## 2017-10-21 NOTE — Progress Notes (Signed)
Name: Seth Lorenzen Sr.   MRN: [id number]    DOB: 01/19/1941   Date:10/21/2017       Progress Note  Subjective  Chief Complaint  Chief Complaint  Patient presents with  . Edema    has noticed that his L) side is "bigger than my right side"- no pain, but has noticed when doing exercises    Abdominal Pain  This is a new (using for left side swelling) problem. Episode onset: noticed last week. The onset quality is gradual (just noticed). The problem occurs constantly. The problem has been unchanged. The pain is located in the LLQ and LUQ. The patient is experiencing no pain. Pertinent negatives include no anorexia, arthralgias, belching, constipation, diarrhea, dysuria, fever, flatus, frequency, headaches, hematochezia, hematuria, melena, myalgias, nausea, vomiting or weight loss. The pain is aggravated by certain positions (noted when standing). The pain is relieved by nothing. He has tried nothing for the symptoms. Prior diagnostic workup includes lower endoscopy. There is no history of abdominal surgery, colon cancer, Crohn's disease, gallstones, GERD, irritable bowel syndrome, pancreatitis, PUD or ulcerative colitis.    No problem-specific Assessment & Plan notes found for this encounter.   Past Medical History:  Diagnosis Date  . AF (atrial fibrillation) (HCC)   . Back pain   . Hemorrhoids   . Hyperlipidemia   . Hypertension     Past Surgical History:  Procedure Laterality Date  . ABLATION    . COLONOSCOPY  12/06/2014   [name]  . EYE SURGERY  2011  . HERNIA REPAIR Right 01/07/15   right inguinal repair, medium ultra Pro mesh  . LAMINECTOMY  2004    Family History  Problem Relation Age of Onset  . Cancer Mother   . Heart disease Father     Social History   Socioeconomic History  . Marital status: Married    Spouse name: Not on file  . Number of children: Not on file  . Years of education: Not on file  . Highest education level: Not on file  Social Needs  .  Financial resource strain: Not on file  . Food insecurity - worry: Not on file  . Food insecurity - inability: Not on file  . Transportation needs - medical: Not on file  . Transportation needs - non-medical: Not on file  Occupational History  . Not on file  Tobacco Use  . Smoking status: Never Smoker  . Smokeless tobacco: Never Used  Substance and Sexual Activity  . Alcohol use: Yes    Alcohol/week: 1.2 oz    Types: 2 Standard drinks or equivalent per week  . Drug use: No  . Sexual activity: Yes  Other Topics Concern  . Not on file  Social History Narrative  . Not on file    No Known Allergies  Outpatient Medications Prior to Visit  Medication Sig Dispense Refill  . amiodarone (PACERONE) 200 MG tablet Take 0.5 tablets by mouth 2 (two) times daily. Dr Lady Gary  0  . Ascorbic Acid (VITAMIN C) 1000 MG tablet Take 1,000 mg by mouth daily.    Marland Kitchen aspirin 81 MG tablet Take 81 mg by mouth daily.    . Flaxseed, Linseed, (FLAX SEEDS PO) Take by mouth.    . metoprolol tartrate (LOPRESSOR) 25 MG tablet Take 25 mg by mouth 2 (two) times daily. Dr Lady Gary  0  . Multiple Vitamins-Minerals (MULTIVITAMIN WITH MINERALS) tablet Take 1 tablet by mouth daily.    . Omega-3 1000 MG CAPS  Take 1 capsule by mouth 2 (two) times daily. otc    . rosuvastatin (CRESTOR) 5 MG tablet Take 1 tablet (5 mg total) by mouth daily. May have 30 days until CPE in Feb 90 tablet 3  . vitamin E 400 UNIT capsule Take 400 Units by mouth daily.     No facility-administered medications prior to visit.     Review of Systems  Constitutional: Negative for chills, fever, malaise/fatigue and weight loss.       Weight gain  HENT: Negative for ear discharge, ear pain and sore throat.   Eyes: Negative for blurred vision.  Respiratory: Negative for cough, sputum production, shortness of breath and wheezing.   Cardiovascular: Negative for chest pain, palpitations and leg swelling.  Gastrointestinal: Positive for abdominal pain.  Negative for anorexia, blood in stool, constipation, diarrhea, flatus, heartburn, hematochezia, melena, nausea and vomiting.  Genitourinary: Negative for dysuria, frequency, hematuria and urgency.  Musculoskeletal: Negative for arthralgias, back pain, joint pain, myalgias and neck pain.  Skin: Negative for rash.  Neurological: Negative for dizziness, tingling, sensory change, focal weakness and headaches.  Endo/Heme/Allergies: Negative for environmental allergies and polydipsia. Does not bruise/bleed easily.  Psychiatric/Behavioral: Negative for depression and suicidal ideas. The patient is not nervous/anxious and does not have insomnia.      Objective  Vitals:   10/21/17 1001  BP: 130/70  Pulse: 72  Weight: 216 lb (98 kg)  Height: 6\' 3"  (1.905 m)    Physical Exam  Constitutional: He is oriented to person, place, and time and well-developed, well-nourished, and in no distress.  HENT:  Head: Normocephalic.  Right Ear: External ear normal.  Left Ear: External ear normal.  Nose: Nose normal.  Mouth/Throat: Oropharynx is clear and moist.  Eyes: Conjunctivae and EOM are normal. Pupils are equal, round, and reactive to light. Right eye exhibits no discharge. Left eye exhibits no discharge. No scleral icterus.  Neck: Normal range of motion. Neck supple. No JVD present. No tracheal deviation present. No thyromegaly present.  Cardiovascular: Normal rate, regular rhythm, normal heart sounds and intact distal pulses. Exam reveals no gallop and no friction rub.  No murmur heard. Pulmonary/Chest: Breath sounds normal. No respiratory distress. He has no wheezes. He has no rales.  Abdominal: Soft. Bowel sounds are normal. He exhibits no mass. There is no hepatosplenomegaly, splenomegaly or hepatomegaly. There is no tenderness. There is no rebound, no guarding and no CVA tenderness.  Genitourinary: Rectum normal and prostate normal. Rectal exam shows guaiac negative stool.  Musculoskeletal: Normal  range of motion. He exhibits no edema or tenderness.       Left hip: He exhibits swelling.       Lumbar back: He exhibits deformity.       Back:       Arms: Mild scoliosis/palpable swelling over left posterior si area  Lymphadenopathy:    He has no cervical adenopathy.  Neurological: He is alert and oriented to person, place, and time. He has normal sensation, normal strength, normal reflexes and intact cranial nerves. No cranial nerve deficit.  Skin: Skin is warm. No rash noted.  Psychiatric: Mood and affect normal.  Nursing note and vitals reviewed.     Assessment & Plan  Problem List Items Addressed This Visit    None    Visit Diagnoses    Abdominal swelling, left lower quadrant    -  Primary   Relevant Orders   US Abdomen Complete   US Pelvis Complete   Pelvic lipomatosis  Scoliosis of lumbosacral spine, unspecified scoliosis type       Colon cancer screening       Relevant Orders   POCT occult blood stool (Completed)      No orders of the defined types were placed in this encounter.     Dr. [facility]  10/21/17

## 2017-10-21 NOTE — Patient Instructions (Signed)
Lipoma A lipoma is a noncancerous (benign) tumor that is made up of fat cells. This is a very common type of soft-tissue growth. Lipomas are usually found under the skin (subcutaneous). They may occur in any tissue of the body that contains fat. Common areas for lipomas to appear include the back, shoulders, buttocks, and thighs. Lipomas grow slowly, and they are usually painless. Most lipomas do not cause problems and do not require treatment. What are the causes? The cause of this condition is not known. What increases the risk? This condition is more likely to develop in:  People who are 40-60 years old.  People who have a family history of lipomas.  What are the signs or symptoms? A lipoma usually appears as a small, round bump under the skin. It may feel soft or rubbery, but the firmness can vary. Most lipomas are not painful. However, a lipoma may become painful if it is located in an area where it pushes on nerves. How is this diagnosed? A lipoma can usually be diagnosed with a physical exam. You may also have tests to confirm the diagnosis and to rule out other conditions. Tests may include:  Imaging tests, such as a CT scan or MRI.  Removal of a tissue sample to be looked at under a microscope (biopsy).  How is this treated? Treatment is not needed for small lipomas that are not causing problems. If a lipoma continues to get bigger or it causes problems, removal is often the best option. Lipomas can also be removed to improve appearance. Removal of a lipoma is usually done with a surgery in which the fatty cells and the surrounding capsule are removed. Most often, a medicine that numbs the area (local anesthetic) is used for this procedure. Follow these instructions at home:  Keep all follow-up visits as directed by your health care provider. This is important. Contact a health care provider if:  Your lipoma becomes larger or hard.  Your lipoma becomes painful, red, or  increasingly swollen. These could be signs of infection or a more serious condition. This information is not intended to replace advice given to you by your health care provider. Make sure you discuss any questions you have with your health care provider. Document Released: 08/28/2002 Document Revised: 02/13/2016 Document Reviewed: 09/03/2014 Elsevier Interactive Patient Education  2018 Elsevier Inc.  

## 2017-10-25 ENCOUNTER — Other Ambulatory Visit: Payer: Self-pay

## 2017-10-25 ENCOUNTER — Ambulatory Visit: Payer: Medicare Other

## 2017-10-26 ENCOUNTER — Ambulatory Visit
Admission: RE | Admit: 2017-10-26 | Discharge: 2017-10-26 | Disposition: A | Discharge status: Home / Self Care | Payer: Medicare Other | Source: Ambulatory Visit | Attending: Family Medicine | Admitting: Family Medicine

## 2017-10-26 DIAGNOSIS — K824 Cholesterolosis of gallbladder: Secondary | ICD-10-CM | POA: Diagnosis not present

## 2017-10-26 DIAGNOSIS — R1904 Left lower quadrant abdominal swelling, mass and lump: Secondary | ICD-10-CM | POA: Diagnosis present

## 2017-10-26 DIAGNOSIS — R1900 Intra-abdominal and pelvic swelling, mass and lump, unspecified site: Secondary | ICD-10-CM | POA: Diagnosis not present

## 2017-10-28 ENCOUNTER — Other Ambulatory Visit: Payer: Self-pay

## 2017-11-13 ENCOUNTER — Other Ambulatory Visit: Payer: Self-pay | Admitting: Family Medicine

## 2017-11-13 DIAGNOSIS — E785 Hyperlipidemia, unspecified: Secondary | ICD-10-CM

## 2017-12-27 DIAGNOSIS — Z85828 Personal history of other malignant neoplasm of skin: Secondary | ICD-10-CM | POA: Diagnosis not present

## 2017-12-27 DIAGNOSIS — L820 Inflamed seborrheic keratosis: Secondary | ICD-10-CM | POA: Diagnosis not present

## 2017-12-27 DIAGNOSIS — D485 Neoplasm of uncertain behavior of skin: Secondary | ICD-10-CM | POA: Diagnosis not present

## 2017-12-27 DIAGNOSIS — L821 Other seborrheic keratosis: Secondary | ICD-10-CM | POA: Diagnosis not present

## 2017-12-27 DIAGNOSIS — Z08 Encounter for follow-up examination after completed treatment for malignant neoplasm: Secondary | ICD-10-CM | POA: Diagnosis not present

## 2017-12-27 DIAGNOSIS — L570 Actinic keratosis: Secondary | ICD-10-CM | POA: Diagnosis not present

## 2017-12-27 DIAGNOSIS — C44712 Basal cell carcinoma of skin of right lower limb, including hip: Secondary | ICD-10-CM | POA: Diagnosis not present

## 2017-12-30 DIAGNOSIS — C44712 Basal cell carcinoma of skin of right lower limb, including hip: Secondary | ICD-10-CM | POA: Diagnosis not present

## 2018-01-31 ENCOUNTER — Telehealth: Payer: Self-pay

## 2018-01-31 NOTE — Telephone Encounter (Signed)
Called pt to sched AWV w/ [facility]. LVM requesting returned call.  

## 2018-03-07 ENCOUNTER — Ambulatory Visit (INDEPENDENT_AMBULATORY_CARE_PROVIDER_SITE_OTHER): Payer: Medicare Other | Admitting: Ophthalmology

## 2018-03-07 ENCOUNTER — Other Ambulatory Visit: Payer: Self-pay

## 2018-03-07 DIAGNOSIS — H35373 Puckering of macula, bilateral: Secondary | ICD-10-CM

## 2018-03-07 DIAGNOSIS — H43813 Vitreous degeneration, bilateral: Secondary | ICD-10-CM | POA: Diagnosis not present

## 2018-03-07 DIAGNOSIS — H26491 Other secondary cataract, right eye: Secondary | ICD-10-CM | POA: Diagnosis not present

## 2018-06-01 DIAGNOSIS — L570 Actinic keratosis: Secondary | ICD-10-CM | POA: Diagnosis not present

## 2018-06-01 DIAGNOSIS — D1721 Benign lipomatous neoplasm of skin and subcutaneous tissue of right arm: Secondary | ICD-10-CM | POA: Diagnosis not present

## 2018-06-01 DIAGNOSIS — X32XXXA Exposure to sunlight, initial encounter: Secondary | ICD-10-CM | POA: Diagnosis not present

## 2018-06-01 DIAGNOSIS — D0462 Carcinoma in situ of skin of left upper limb, including shoulder: Secondary | ICD-10-CM | POA: Diagnosis not present

## 2018-06-01 DIAGNOSIS — Z85828 Personal history of other malignant neoplasm of skin: Secondary | ICD-10-CM | POA: Diagnosis not present

## 2018-06-01 DIAGNOSIS — Z08 Encounter for follow-up examination after completed treatment for malignant neoplasm: Secondary | ICD-10-CM | POA: Diagnosis not present

## 2018-06-01 DIAGNOSIS — D0461 Carcinoma in situ of skin of right upper limb, including shoulder: Secondary | ICD-10-CM | POA: Diagnosis not present

## 2018-06-01 DIAGNOSIS — D485 Neoplasm of uncertain behavior of skin: Secondary | ICD-10-CM | POA: Diagnosis not present

## 2018-06-13 DIAGNOSIS — Z23 Encounter for immunization: Secondary | ICD-10-CM | POA: Diagnosis not present

## 2018-06-29 DIAGNOSIS — D0462 Carcinoma in situ of skin of left upper limb, including shoulder: Secondary | ICD-10-CM | POA: Diagnosis not present

## 2018-06-29 DIAGNOSIS — D0461 Carcinoma in situ of skin of right upper limb, including shoulder: Secondary | ICD-10-CM | POA: Diagnosis not present

## 2018-08-23 ENCOUNTER — Other Ambulatory Visit: Payer: Self-pay | Admitting: Orthopedic Surgery

## 2018-08-23 DIAGNOSIS — S46001A Unspecified injury of muscle(s) and tendon(s) of the rotator cuff of right shoulder, initial encounter: Secondary | ICD-10-CM | POA: Diagnosis not present

## 2018-08-23 DIAGNOSIS — M25511 Pain in right shoulder: Secondary | ICD-10-CM | POA: Diagnosis not present

## 2018-08-23 DIAGNOSIS — G8929 Other chronic pain: Secondary | ICD-10-CM | POA: Diagnosis not present

## 2018-08-23 DIAGNOSIS — M25311 Other instability, right shoulder: Secondary | ICD-10-CM | POA: Diagnosis not present

## 2018-09-08 ENCOUNTER — Ambulatory Visit
Admission: RE | Admit: 2018-09-08 | Discharge: 2018-09-08 | Disposition: A | Discharge status: Home / Self Care | Payer: Medicare Other | Source: Ambulatory Visit | Attending: Orthopedic Surgery | Admitting: Orthopedic Surgery

## 2018-09-08 DIAGNOSIS — M25511 Pain in right shoulder: Secondary | ICD-10-CM | POA: Diagnosis not present

## 2018-09-08 DIAGNOSIS — G8929 Other chronic pain: Secondary | ICD-10-CM | POA: Diagnosis not present

## 2018-09-08 DIAGNOSIS — S46001A Unspecified injury of muscle(s) and tendon(s) of the rotator cuff of right shoulder, initial encounter: Secondary | ICD-10-CM

## 2018-09-08 DIAGNOSIS — M19011 Primary osteoarthritis, right shoulder: Secondary | ICD-10-CM | POA: Diagnosis not present

## 2018-09-08 DIAGNOSIS — M25311 Other instability, right shoulder: Secondary | ICD-10-CM | POA: Diagnosis not present

## 2018-09-08 DIAGNOSIS — M75121 Complete rotator cuff tear or rupture of right shoulder, not specified as traumatic: Secondary | ICD-10-CM | POA: Diagnosis not present

## 2018-10-07 DIAGNOSIS — S46011D Strain of muscle(s) and tendon(s) of the rotator cuff of right shoulder, subsequent encounter: Secondary | ICD-10-CM | POA: Diagnosis not present

## 2018-10-10 DIAGNOSIS — Z8679 Personal history of other diseases of the circulatory system: Secondary | ICD-10-CM | POA: Diagnosis not present

## 2018-10-10 DIAGNOSIS — I480 Paroxysmal atrial fibrillation: Secondary | ICD-10-CM | POA: Diagnosis not present

## 2018-10-10 DIAGNOSIS — E782 Mixed hyperlipidemia: Secondary | ICD-10-CM | POA: Diagnosis not present

## 2018-10-11 DIAGNOSIS — I480 Paroxysmal atrial fibrillation: Secondary | ICD-10-CM | POA: Diagnosis not present

## 2018-10-11 DIAGNOSIS — E782 Mixed hyperlipidemia: Secondary | ICD-10-CM | POA: Diagnosis not present

## 2018-10-31 DIAGNOSIS — M25511 Pain in right shoulder: Secondary | ICD-10-CM | POA: Diagnosis not present

## 2018-11-01 DIAGNOSIS — X32XXXA Exposure to sunlight, initial encounter: Secondary | ICD-10-CM | POA: Diagnosis not present

## 2018-11-01 DIAGNOSIS — L821 Other seborrheic keratosis: Secondary | ICD-10-CM | POA: Diagnosis not present

## 2018-11-01 DIAGNOSIS — D1801 Hemangioma of skin and subcutaneous tissue: Secondary | ICD-10-CM | POA: Diagnosis not present

## 2018-11-01 DIAGNOSIS — L570 Actinic keratosis: Secondary | ICD-10-CM | POA: Diagnosis not present

## 2018-11-01 DIAGNOSIS — Z85828 Personal history of other malignant neoplasm of skin: Secondary | ICD-10-CM | POA: Diagnosis not present

## 2018-11-01 DIAGNOSIS — L538 Other specified erythematous conditions: Secondary | ICD-10-CM | POA: Diagnosis not present

## 2018-11-01 DIAGNOSIS — L820 Inflamed seborrheic keratosis: Secondary | ICD-10-CM | POA: Diagnosis not present

## 2018-11-01 DIAGNOSIS — Z08 Encounter for follow-up examination after completed treatment for malignant neoplasm: Secondary | ICD-10-CM | POA: Diagnosis not present

## 2018-11-07 ENCOUNTER — Other Ambulatory Visit: Payer: Self-pay | Admitting: Family Medicine

## 2018-11-07 DIAGNOSIS — E785 Hyperlipidemia, unspecified: Secondary | ICD-10-CM

## 2018-11-07 DIAGNOSIS — M25611 Stiffness of right shoulder, not elsewhere classified: Secondary | ICD-10-CM | POA: Diagnosis not present

## 2018-11-07 DIAGNOSIS — M25511 Pain in right shoulder: Secondary | ICD-10-CM | POA: Diagnosis not present

## 2018-11-15 DIAGNOSIS — M25611 Stiffness of right shoulder, not elsewhere classified: Secondary | ICD-10-CM | POA: Diagnosis not present

## 2018-11-15 DIAGNOSIS — M25511 Pain in right shoulder: Secondary | ICD-10-CM | POA: Diagnosis not present

## 2018-11-22 DIAGNOSIS — M25611 Stiffness of right shoulder, not elsewhere classified: Secondary | ICD-10-CM | POA: Diagnosis not present

## 2018-11-22 DIAGNOSIS — M25511 Pain in right shoulder: Secondary | ICD-10-CM | POA: Diagnosis not present

## 2018-11-23 ENCOUNTER — Other Ambulatory Visit: Payer: Self-pay

## 2018-11-23 DIAGNOSIS — E785 Hyperlipidemia, unspecified: Secondary | ICD-10-CM

## 2018-11-23 MED ORDER — ROSUVASTATIN CALCIUM 5 MG PO TABS: 5.0000 mg | ORAL_TABLET | Freq: Every day | ORAL | 0 refills | Status: DC

## 2018-11-24 ENCOUNTER — Encounter: Payer: Self-pay | Admitting: Family Medicine

## 2018-11-24 ENCOUNTER — Ambulatory Visit (INDEPENDENT_AMBULATORY_CARE_PROVIDER_SITE_OTHER): Payer: Medicare Other | Admitting: Family Medicine

## 2018-11-24 VITALS — BP 120/62 | HR 64 | Ht 75.0 in | Wt 215.0 lb

## 2018-11-24 DIAGNOSIS — E785 Hyperlipidemia, unspecified: Secondary | ICD-10-CM

## 2018-11-24 DIAGNOSIS — E782 Mixed hyperlipidemia: Secondary | ICD-10-CM | POA: Diagnosis not present

## 2018-11-24 MED ORDER — ROSUVASTATIN CALCIUM 5 MG PO TABS: 5.0000 mg | ORAL_TABLET | Freq: Every day | ORAL | 3 refills | Status: DC

## 2018-11-24 MED ORDER — ROSUVASTATIN CALCIUM 5 MG PO TABS: 5.0000 mg | ORAL_TABLET | Freq: Every day | ORAL | 0 refills | Status: DC

## 2018-11-24 NOTE — Progress Notes (Signed)
Date:  11/24/2018   Name:  Seth Stables Sr.   DOB:  11/26/1940   MRN:  [id number]   Chief Complaint: Hyperlipidemia (needed documentation- no labs)  Hyperlipidemia  This is a chronic problem. The current episode started more than 1 year ago. The problem is controlled. Recent lipid tests were reviewed and are normal. He has no history of chronic renal disease, diabetes, hypothyroidism, liver disease, obesity or nephrotic syndrome. There are no known factors aggravating his hyperlipidemia. Pertinent negatives include no chest pain, focal sensory loss, focal weakness, leg pain, myalgias or shortness of breath. Current antihyperlipidemic treatment includes statins. The current treatment provides moderate improvement of lipids. There are no compliance problems.  Risk factors for coronary artery disease include dyslipidemia and hypertension.    Review of Systems  Constitutional: Negative for chills and fever.  HENT: Negative for drooling, ear discharge, ear pain and sore throat.   Respiratory: Negative for cough, shortness of breath and wheezing.   Cardiovascular: Negative for chest pain, palpitations and leg swelling.  Gastrointestinal: Negative for abdominal pain, blood in stool, constipation, diarrhea and nausea.  Endocrine: Negative for polydipsia.  Genitourinary: Negative for dysuria, frequency, hematuria and urgency.  Musculoskeletal: Negative for back pain, myalgias and neck pain.  Skin: Negative for rash.  Allergic/Immunologic: Negative for environmental allergies.  Neurological: Negative for dizziness, focal weakness and headaches.  Hematological: Does not bruise/bleed easily.  Psychiatric/Behavioral: Negative for suicidal ideas. The patient is not nervous/anxious.     Patient Active Problem List   Diagnosis Date Noted  . Right inguinal hernia 12/31/2014  . Atrial fibrillation (HCC) 05/03/2014  . HLD (hyperlipidemia) 05/03/2014  . Lumbar canal stenosis 04/12/2012     No Known Allergies  Past Surgical History:  Procedure Laterality Date  . ABLATION    . COLONOSCOPY  12/06/2014   [name]  . EYE SURGERY  2011  . HERNIA REPAIR Right 01/07/15   right inguinal repair, medium ultra Pro mesh  . LAMINECTOMY  2004    Social History   Tobacco Use  . Smoking status: Never Smoker  . Smokeless tobacco: Never Used  Substance Use Topics  . Alcohol use: Yes    Alcohol/week: 2.0 standard drinks    Types: 2 Standard drinks or equivalent per week  . Drug use: No     Medication list has been reviewed and updated.  Current Meds  Medication Sig  . amiodarone (PACERONE) 200 MG tablet Take 0.5 tablets by mouth 2 (two) times daily. Dr Lady Gary  . Ascorbic Acid (VITAMIN C) 1000 MG tablet Take 1,000 mg by mouth daily.  Marland Kitchen aspirin 81 MG tablet Take 81 mg by mouth daily.  . Flaxseed, Linseed, (FLAX SEEDS PO) Take by mouth.  . metoprolol tartrate (LOPRESSOR) 25 MG tablet Take 25 mg by mouth 2 (two) times daily. Dr Lady Gary  . Multiple Vitamins-Minerals (MULTIVITAMIN WITH MINERALS) tablet Take 1 tablet by mouth daily.  . Omega-3 1000 MG CAPS Take 1 capsule by mouth 2 (two) times daily. otc  . rosuvastatin (CRESTOR) 5 MG tablet Take 1 tablet (5 mg total) by mouth daily.  . vitamin E 400 UNIT capsule Take 400 Units by mouth daily.    PHQ 2/9 Scores 11/24/2018 11/23/2016 11/11/2015  PHQ - 2 Score 0 0 0  PHQ- 9 Score 0 - -    Physical Exam Vitals signs and nursing note reviewed.  HENT:     Head: Normocephalic.     Right Ear: Tympanic membrane, ear canal  and external ear normal.     Left Ear: Tympanic membrane, ear canal and external ear normal.     Nose: Nose normal.  Eyes:     General: No scleral icterus.       Right eye: No discharge.        Left eye: No discharge.     Conjunctiva/sclera: Conjunctivae normal.     Pupils: Pupils are equal, round, and reactive to light.  Neck:     Musculoskeletal: Normal range of motion and neck supple.     Thyroid: No thyromegaly.      Vascular: No JVD.     Trachea: No tracheal deviation.  Cardiovascular:     Rate and Rhythm: Normal rate and regular rhythm.     Heart sounds: Normal heart sounds. No murmur. No friction rub. No gallop.   Pulmonary:     Effort: No respiratory distress.     Breath sounds: Normal breath sounds. No wheezing or rales.  Abdominal:     General: Bowel sounds are normal.     Palpations: Abdomen is soft. There is no mass.     Tenderness: There is no abdominal tenderness. There is no guarding or rebound.  Musculoskeletal: Normal range of motion.        General: No tenderness.  Lymphadenopathy:     Cervical: No cervical adenopathy.  Skin:    General: Skin is warm.     Findings: No rash.  Neurological:     Mental Status: He is alert and oriented to person, place, and time.     Cranial Nerves: No cranial nerve deficit.     Deep Tendon Reflexes: Reflexes are normal and symmetric.     BP 120/62   Pulse 64   Ht 6\' 3"  (1.905 m)   Wt 215 lb (97.5 kg)   BMI 26.87 kg/m   Assessment and Plan: 1. Mixed hyperlipidemia Chronic.  Controlled.  Continue Crestor 5 mg once a day.  Review of recent labs by Dr. Lady Gary noted that Strahl triglycerides are in acceptable range.  She was instructed to attend the fighting [place] game in [place] for his  emotional will being. - rosuvastatin (CRESTOR) 5 MG tablet; Take 1 tablet (5 mg total) by mouth daily.  Dispense: 90 tablet; Refill: 3

## 2018-11-28 DIAGNOSIS — M25511 Pain in right shoulder: Secondary | ICD-10-CM | POA: Diagnosis not present

## 2019-01-30 DIAGNOSIS — M25511 Pain in right shoulder: Secondary | ICD-10-CM | POA: Diagnosis not present

## 2019-03-13 ENCOUNTER — Encounter (INDEPENDENT_AMBULATORY_CARE_PROVIDER_SITE_OTHER): Payer: Medicare Other | Admitting: Ophthalmology

## 2019-04-05 DIAGNOSIS — M65872 Other synovitis and tenosynovitis, left ankle and foot: Secondary | ICD-10-CM | POA: Diagnosis not present

## 2019-04-05 DIAGNOSIS — M2042 Other hammer toe(s) (acquired), left foot: Secondary | ICD-10-CM | POA: Diagnosis not present

## 2019-04-05 DIAGNOSIS — M79672 Pain in left foot: Secondary | ICD-10-CM | POA: Diagnosis not present

## 2019-04-13 ENCOUNTER — Encounter (INDEPENDENT_AMBULATORY_CARE_PROVIDER_SITE_OTHER): Payer: Medicare Other | Admitting: Ophthalmology

## 2019-04-13 ENCOUNTER — Other Ambulatory Visit: Payer: Self-pay

## 2019-04-13 DIAGNOSIS — H35373 Puckering of macula, bilateral: Secondary | ICD-10-CM

## 2019-04-13 DIAGNOSIS — H43813 Vitreous degeneration, bilateral: Secondary | ICD-10-CM | POA: Diagnosis not present

## 2019-05-02 DIAGNOSIS — C44619 Basal cell carcinoma of skin of left upper limb, including shoulder: Secondary | ICD-10-CM | POA: Diagnosis not present

## 2019-05-02 DIAGNOSIS — D2262 Melanocytic nevi of left upper limb, including shoulder: Secondary | ICD-10-CM | POA: Diagnosis not present

## 2019-05-02 DIAGNOSIS — Z85828 Personal history of other malignant neoplasm of skin: Secondary | ICD-10-CM | POA: Diagnosis not present

## 2019-05-02 DIAGNOSIS — D485 Neoplasm of uncertain behavior of skin: Secondary | ICD-10-CM | POA: Diagnosis not present

## 2019-05-02 DIAGNOSIS — X32XXXA Exposure to sunlight, initial encounter: Secondary | ICD-10-CM | POA: Diagnosis not present

## 2019-05-02 DIAGNOSIS — D2272 Melanocytic nevi of left lower limb, including hip: Secondary | ICD-10-CM | POA: Diagnosis not present

## 2019-05-02 DIAGNOSIS — D2261 Melanocytic nevi of right upper limb, including shoulder: Secondary | ICD-10-CM | POA: Diagnosis not present

## 2019-05-02 DIAGNOSIS — L570 Actinic keratosis: Secondary | ICD-10-CM | POA: Diagnosis not present

## 2019-05-08 DIAGNOSIS — H5051 Esophoria: Secondary | ICD-10-CM | POA: Diagnosis not present

## 2019-05-08 DIAGNOSIS — H532 Diplopia: Secondary | ICD-10-CM | POA: Diagnosis not present

## 2019-05-08 DIAGNOSIS — H35373 Puckering of macula, bilateral: Secondary | ICD-10-CM | POA: Diagnosis not present

## 2019-05-08 DIAGNOSIS — M25512 Pain in left shoulder: Secondary | ICD-10-CM | POA: Diagnosis not present

## 2019-05-08 DIAGNOSIS — Z961 Presence of intraocular lens: Secondary | ICD-10-CM | POA: Diagnosis not present

## 2019-05-09 ENCOUNTER — Other Ambulatory Visit: Payer: Self-pay

## 2019-05-09 ENCOUNTER — Other Ambulatory Visit: Payer: Self-pay | Admitting: Family Medicine

## 2019-05-09 ENCOUNTER — Emergency Department
Admission: EM | Admit: 2019-05-09 | Discharge: 2019-05-09 | Disposition: A | Discharge status: Home / Self Care | Payer: Medicare Other | Attending: Emergency Medicine | Admitting: Emergency Medicine

## 2019-05-09 ENCOUNTER — Emergency Department: Payer: Medicare Other

## 2019-05-09 DIAGNOSIS — Z7982 Long term (current) use of aspirin: Secondary | ICD-10-CM | POA: Diagnosis not present

## 2019-05-09 DIAGNOSIS — R0789 Other chest pain: Secondary | ICD-10-CM

## 2019-05-09 DIAGNOSIS — I10 Essential (primary) hypertension: Secondary | ICD-10-CM | POA: Diagnosis not present

## 2019-05-09 DIAGNOSIS — R079 Chest pain, unspecified: Secondary | ICD-10-CM | POA: Diagnosis not present

## 2019-05-09 DIAGNOSIS — R001 Bradycardia, unspecified: Secondary | ICD-10-CM | POA: Diagnosis not present

## 2019-05-09 DIAGNOSIS — Z79899 Other long term (current) drug therapy: Secondary | ICD-10-CM | POA: Insufficient documentation

## 2019-05-09 LAB — CBC WITH DIFFERENTIAL/PLATELET
Abs Immature Granulocytes: 0.03 10*3/uL (ref 0.00–0.07)
Basophils Absolute: 0 10*3/uL (ref 0.0–0.1)
Basophils Relative: 0 %
Eosinophils Absolute: 0 10*3/uL (ref 0.0–0.5)
Eosinophils Relative: 0 %
HCT: 39.3 % (ref 39.0–52.0)
Hemoglobin: 13.6 g/dL (ref 13.0–17.0)
Immature Granulocytes: 0 %
Lymphocytes Relative: 9 %
Lymphs Abs: 0.8 10*3/uL (ref 0.7–4.0)
MCH: 32.2 pg (ref 26.0–34.0)
MCHC: 34.6 g/dL (ref 30.0–36.0)
MCV: 92.9 fL (ref 80.0–100.0)
Monocytes Absolute: 0.1 10*3/uL (ref 0.1–1.0)
Monocytes Relative: 2 %
Neutro Abs: 8 10*3/uL — ABNORMAL HIGH (ref 1.7–7.7)
Neutrophils Relative %: 89 %
Platelets: 190 10*3/uL (ref 150–400)
RBC: 4.23 MIL/uL (ref 4.22–5.81)
RDW: 12.6 % (ref 11.5–15.5)
WBC: 8.9 10*3/uL (ref 4.0–10.5)
nRBC: 0 % (ref 0.0–0.2)

## 2019-05-09 LAB — BASIC METABOLIC PANEL
Anion gap: 5 (ref 5–15)
BUN: 15 mg/dL (ref 8–23)
CO2: 25 mmol/L (ref 22–32)
Calcium: 9.5 mg/dL (ref 8.9–10.3)
Chloride: 104 mmol/L (ref 98–111)
Creatinine, Ser: 0.86 mg/dL (ref 0.61–1.24)
GFR calc Af Amer: >60 mL/min (ref >60)
GFR calc non Af Amer: >60 mL/min (ref >60)
Glucose, Bld: 173 mg/dL — ABNORMAL HIGH (ref 70–99)
Potassium: 3.9 mmol/L (ref 3.5–5.1)
Sodium: 134 mmol/L — ABNORMAL LOW (ref 135–145)

## 2019-05-09 LAB — TROPONIN I (HIGH SENSITIVITY)
Troponin I (High Sensitivity): 3 ng/L (ref <18)
Troponin I (High Sensitivity): 3 ng/L (ref <18)

## 2019-05-09 LAB — FIBRIN DERIVATIVES D-DIMER (ARMC ONLY): Fibrin derivatives D-dimer (ARMC): 339.99 ng/mL (FEU) (ref 0.00–499.00)

## 2019-05-09 MED ORDER — KETOROLAC TROMETHAMINE 30 MG/ML IJ SOLN
15.0000 mg | Freq: Once | INTRAMUSCULAR | Status: AC
  Administered 2019-05-09: 15 mg via INTRAVENOUS
  Filled 2019-05-09: qty 1

## 2019-05-09 MED ORDER — LIDOCAINE 5 % EX PTCH: 1.0000 | MEDICATED_PATCH | Freq: Two times a day (BID) | CUTANEOUS | 0 refills | Status: AC

## 2019-05-09 MED ORDER — LIDOCAINE 5 % EX PTCH
1.0000 | MEDICATED_PATCH | CUTANEOUS | Status: DC
  Administered 2019-05-09: 1 via TRANSDERMAL
  Filled 2019-05-09: qty 1

## 2019-05-09 MED ORDER — NAPROXEN 375 MG PO TABS: 375.0000 mg | ORAL_TABLET | Freq: Two times a day (BID) | ORAL | 0 refills | Status: AC

## 2019-05-09 NOTE — ED Triage Notes (Signed)
Pt arrives to ED from home via [facility] with c/c of left side chest pain. Pt had cortisone shot in left shoulder this morning but felt this was more anterior and moved towards his mid chest as the evening progressed. Pt described as intermittent, sharp. Pt has Hx of afib. He reports taking 650mg  of bayer aspirin after chest pain began. EMS reports transport vitals 128/74, P60, NSR with occasional brady. O2 sat 98% on room air. Upon arrival, pt A&Ox4, NAD, no respiratory Sx evident.

## 2019-05-09 NOTE — ED Provider Notes (Signed)
[facility] Emergency Department Provider Note  ____________________________________________   First MD Initiated Contact with Patient 05/09/19 0200     (approximate)  I have reviewed the triage vital signs and the nursing notes.   HISTORY  Chief Complaint Chest Pain    HPI Seth Mielke Sr. is a 78 y.o. male  Here with chest pain. Pt reports that he awoke this evening with a sharp, reproducible, left anterior chest wall pain. Pt was in his usual state of health going to bed. He did have a L shouler injection and XR at his Orthopedist but otherwise was doing well. He reports that he has been chopping wood in his yard for a few days but otherwise has not had any falls or trauma. Reports intermittent, sharp, stabbing, left anterior chest wall pain that is worse w/ palpation, movement. No SOB. No diaphoresis. No palpitations. H/o AFIB. No h/o CAD per his report. No recent fever, chills, cough. No leg swelling. No other complaints.       Past Medical History:  Diagnosis Date  . AF (atrial fibrillation) ([place])   . Back pain   . Hemorrhoids   . Hyperlipidemia   . Hypertension     Patient Active Problem List   Diagnosis Date Noted  . Right inguinal hernia 12/31/2014  . Atrial fibrillation ([place]) 05/03/2014  . HLD (hyperlipidemia) 05/03/2014  . Lumbar canal stenosis 04/12/2012    Past Surgical History:  Procedure Laterality Date  . ABLATION    . COLONOSCOPY  12/06/2014   Dr. Candace Cruise  . EYE SURGERY  2011  . HERNIA REPAIR Right 01/07/15   right inguinal repair, medium ultra Pro mesh  . LAMINECTOMY  2004    Prior to Admission medications   Medication Sig Start Date End Date Taking? Authorizing Provider  amiodarone (PACERONE) 200 MG tablet Take 0.5 tablets by mouth 2 (two) times daily. Dr Ubaldo Glassing 11/05/15   [provider]  Ascorbic Acid (VITAMIN C) 1000 MG tablet Take 1,000 mg by mouth daily.    [provider]  aspirin 81 MG tablet  Take 81 mg by mouth daily.    [provider]  Flaxseed, Linseed, (FLAX SEEDS PO) Take by mouth.    [provider]  lidocaine (LIDODERM) 5 % Place 1 patch onto the skin every 12 (twelve) hours for 5 days. Remove & Discard patch within 12 hours or as directed by MD 05/09/19 05/14/19  Duffy Bruce, MD  meloxicam (MOBIC) 15 MG tablet Take 1 tablet by mouth daily. Dr Redmond Pulling    [provider]  metoprolol tartrate (LOPRESSOR) 25 MG tablet Take 25 mg by mouth 2 (two) times daily. Dr Ubaldo Glassing 10/22/14   [provider]  Multiple Vitamins-Minerals (MULTIVITAMIN WITH MINERALS) tablet Take 1 tablet by mouth daily.    [provider]  naproxen (NAPROSYN) 375 MG tablet Take 1 tablet (375 mg total) by mouth 2 (two) times daily with a meal for 5 days. 05/09/19 05/14/19  Duffy Bruce, MD  Omega-3 1000 MG CAPS Take 1 capsule by mouth 2 (two) times daily. otc    [provider]  rosuvastatin (CRESTOR) 5 MG tablet Take 1 tablet (5 mg total) by mouth daily. 11/24/18   Juline Patch, MD  tiZANidine (ZANAFLEX) 4 MG tablet 1 tablet. Dr Redmond Pulling    [provider]  vitamin E 400 UNIT capsule Take 400 Units by mouth daily.    [provider]    Allergies Patient has no  known allergies.  Family History  Problem Relation Age of Onset  . Cancer Mother   . Heart disease Father     Social History Social History   Tobacco Use  . Smoking status: Never Smoker  . Smokeless tobacco: Never Used  Substance Use Topics  . Alcohol use: Yes    Alcohol/week: 2.0 standard drinks    Types: 2 Standard drinks or equivalent per week  . Drug use: No    Review of Systems  Review of Systems  Constitutional: Negative for chills, fatigue and fever.  HENT: Negative for sore throat.   Respiratory: Negative for shortness of breath.   Cardiovascular: Positive for chest pain.  Gastrointestinal: Negative for abdominal pain.  Genitourinary: Negative for flank pain.   Musculoskeletal: Negative for neck pain.  Skin: Negative for rash and wound.  Allergic/Immunologic: Negative for immunocompromised state.  Neurological: Negative for weakness and numbness.  Hematological: Does not bruise/bleed easily.  All other systems reviewed and are negative.    ____________________________________________  PHYSICAL EXAM:      VITAL SIGNS: ED Triage Vitals  Enc Vitals Group     BP 05/09/19 0149 (!) 166/76     Pulse Rate 05/09/19 0149 (!) 58     Resp 05/09/19 0149 13     Temp 05/09/19 0149 98 F (36.7 C)     Temp Source 05/09/19 0149 Oral     SpO2 05/09/19 0149 97 %     Weight 05/09/19 0153 198 lb (89.8 kg)     Height 05/09/19 0153 6\' 3"  (1.905 m)     Head Circumference --      Peak Flow --      Pain Score 05/09/19 0151 1     Pain Loc --      Pain Edu? --      Excl. in [place]? --      Physical Exam Vitals signs and nursing note reviewed.  Constitutional:      General: He is not in acute distress.    Appearance: He is well-developed.  HENT:     Head: Normocephalic and atraumatic.  Eyes:     Conjunctiva/sclera: Conjunctivae normal.  Neck:     Musculoskeletal: Neck supple.  Cardiovascular:     Rate and Rhythm: Normal rate and regular rhythm.     Heart sounds: Normal heart sounds. No murmur. No friction rub.  Pulmonary:     Effort: Pulmonary effort is normal. No respiratory distress.     Breath sounds: Normal breath sounds. No wheezing or rales.  Chest:    Abdominal:     General: There is no distension.     Palpations: Abdomen is soft.     Tenderness: There is no abdominal tenderness.  Skin:    General: Skin is warm.     Capillary Refill: Capillary refill takes less than 2 seconds.  Neurological:     Mental Status: He is alert and oriented to person, place, and time.     Motor: No abnormal muscle tone.       ____________________________________________   LABS (all labs ordered are listed, but only abnormal results are displayed)   Labs Reviewed  CBC WITH DIFFERENTIAL/PLATELET - Abnormal; Notable for the following components:      Result Value   Neutro Abs 8.0 (*)    All other components within normal limits  BASIC METABOLIC PANEL - Abnormal; Notable for the following components:   Sodium 134 (*)    Glucose, Bld 173 (*)  All other components within normal limits  FIBRIN DERIVATIVES D-DIMER ([facility] ONLY)  TROPONIN I (HIGH SENSITIVITY)  TROPONIN I (HIGH SENSITIVITY)  TROPONIN I (HIGH SENSITIVITY)    ____________________________________________  EKG: Sinus rhythm, VR 69. QRS 94, QTc 449. No acute ischemic changes.  ________________________________________  RADIOLOGY All imaging, including plain films, CT scans, and ultrasounds, independently reviewed by me, and interpretations confirmed via formal radiology reads.  ED MD interpretation:   CXR: clear  Official radiology report(s): Dg Chest 2 View  Result Date: 05/09/2019 CLINICAL DATA:  Left-sided chest pain EXAM: CHEST - 2 VIEW COMPARISON:  CT abdomen pelvis 12/13/2014, chest radiograph 08/17/2010 FINDINGS: Coarse interstitial changes are similar to prior. No consolidation, features of edema, pneumothorax, or effusion. Pulmonary vascularity is normally distributed. The cardiomediastinal contours are unremarkable. No acute osseous or soft tissue abnormality. Degenerative changes are present in the imaged spine and shoulders. IMPRESSION: No acute cardiopulmonary disease. Electronically Signed   By: Lovena Le M.D.   On: 05/09/2019 03:13    ____________________________________________  PROCEDURES   Procedure(s) performed (including Critical Care):  Procedures  ____________________________________________  INITIAL IMPRESSION / MDM / [place] / ED COURSE  As part of my medical decision making, I reviewed the following data within the [place] Notes from prior ED visits and [place] Controlled Substance Database      *Seth Paxton Sr. was evaluated in Emergency Department on 05/09/2019 for the symptoms described in the history of present illness. He was evaluated in the context of the global COVID-19 pandemic, which necessitated consideration that the patient might be at risk for infection with the SARS-CoV-2 virus that causes COVID-19. Institutional protocols and algorithms that pertain to the evaluation of patients at risk for COVID-19 are in a state of rapid change based on information released by regulatory bodies including the CDC and federal and state organizations. These policies and algorithms were followed during the patient's care in the ED.  Some ED evaluations and interventions may be delayed as a result of limited staffing during the pandemic.*   Clinical Course as of May 08 999  Tue May 09, 2019  0439 78 yo M here with atypical, reproducible, pinpoint chest pain. Trop neg, EKG non-ischemic despite constant sx - doubt ACS. Pain is not c/w PE or dissection, and he is not tachycardic or hypoxic with neg D-Dimer - doubt PE, dissection. CXR is clear with no PTX or signs of complication from recent injection. Suspect this is MSK chest wall pain 2/2 increased activity/wood chopping at home. Pain improved w/ Toradol. Wil have him take sched naproxen, lidoderm patch, with good outpt follow-up.   [CI]    Clinical Course User Index [CI] Duffy Bruce, MD    Medical Decision Making: As above   ____________________________________________  FINAL CLINICAL IMPRESSION(S) / ED DIAGNOSES  Final diagnoses:  Atypical chest pain     MEDICATIONS GIVEN DURING THIS VISIT:  Medications  ketorolac (TORADOL) 30 MG/ML injection 15 mg (15 mg Intravenous Given 05/09/19 0316)     ED Discharge Orders         Ordered    lidocaine (LIDODERM) 5 %  Every 12 hours     05/09/19 0550    naproxen (NAPROSYN) 375 MG tablet  2 times daily with meals     05/09/19 0550           Note:  This document was prepared  using Dragon voice recognition software and may include unintentional dictation errors.  Duffy Bruce, MD 05/09/19 1000

## 2019-05-16 ENCOUNTER — Ambulatory Visit (INDEPENDENT_AMBULATORY_CARE_PROVIDER_SITE_OTHER): Payer: Medicare Other | Admitting: Family Medicine

## 2019-05-16 ENCOUNTER — Encounter: Payer: Self-pay | Admitting: Family Medicine

## 2019-05-16 ENCOUNTER — Other Ambulatory Visit: Payer: Self-pay

## 2019-05-16 VITALS — BP 118/80 | HR 52 | Ht 75.0 in | Wt 201.0 lb

## 2019-05-16 DIAGNOSIS — R0789 Other chest pain: Secondary | ICD-10-CM | POA: Diagnosis not present

## 2019-05-16 DIAGNOSIS — I480 Paroxysmal atrial fibrillation: Secondary | ICD-10-CM | POA: Diagnosis not present

## 2019-05-16 DIAGNOSIS — Z23 Encounter for immunization: Secondary | ICD-10-CM

## 2019-05-16 DIAGNOSIS — E782 Mixed hyperlipidemia: Secondary | ICD-10-CM

## 2019-05-16 NOTE — Progress Notes (Signed)
Date:  05/16/2019   Name:  Seth Silvan Sr.   DOB:  01-31-1941   MRN:  [id number]   Chief Complaint: Follow-up (follow up hosp d/c after eval- Dx chest wall pain) and influ vacc need  Patient is a 78 year old male who presents for a er reevaluation exam. The patient reports the following problems: atypical chest pain. Health maintenance has been reviewed up to date.  Chest Pain  This is a chronic problem. The current episode started more than 1 year ago. The onset quality is sudden. The problem occurs intermittently. The pain is present in the lateral region (left pectoral). The pain is at a severity of 7/10 (at time of pain). Quality: "twinge" The pain does not radiate. Pertinent negatives include no abdominal pain, back pain, cough, dizziness, fever, headaches, nausea, palpitations or shortness of breath.    Review of Systems  Constitutional: Negative for chills and fever.  HENT: Negative for drooling, ear discharge, ear pain and sore throat.   Respiratory: Negative for cough, shortness of breath and wheezing.   Cardiovascular: Negative for chest pain, palpitations and leg swelling.  Gastrointestinal: Negative for abdominal pain, blood in stool, constipation, diarrhea and nausea.  Endocrine: Negative for polydipsia.  Genitourinary: Negative for dysuria, frequency, hematuria and urgency.  Musculoskeletal: Negative for back pain, myalgias and neck pain.  Skin: Negative for rash.  Allergic/Immunologic: Negative for environmental allergies.  Neurological: Negative for dizziness and headaches.  Hematological: Does not bruise/bleed easily.  Psychiatric/Behavioral: Negative for suicidal ideas. The patient is not nervous/anxious.     Patient Active Problem List   Diagnosis Date Noted  . Right inguinal hernia 12/31/2014  . Atrial fibrillation (HCC) 05/03/2014  . HLD (hyperlipidemia) 05/03/2014  . Lumbar canal stenosis 04/12/2012    No Known Allergies  Past Surgical  History:  Procedure Laterality Date  . ABLATION    . COLONOSCOPY  12/06/2014   [name]  . EYE SURGERY  2011  . HERNIA REPAIR Right 01/07/15   right inguinal repair, medium ultra Pro mesh  . LAMINECTOMY  2004    Social History   Tobacco Use  . Smoking status: Never Smoker  . Smokeless tobacco: Never Used  Substance Use Topics  . Alcohol use: Yes    Alcohol/week: 2.0 standard drinks    Types: 2 Standard drinks or equivalent per week  . Drug use: No     Medication list has been reviewed and updated.  Current Meds  Medication Sig  . amiodarone (PACERONE) 200 MG tablet Take 0.5 tablets by mouth 2 (two) times daily. Dr Lady Gary  . Ascorbic Acid (VITAMIN C) 1000 MG tablet Take 1,000 mg by mouth daily.  Marland Kitchen aspirin 81 MG tablet Take 81 mg by mouth daily.  . Flaxseed, Linseed, (FLAX SEEDS PO) Take by mouth.  . metoprolol tartrate (LOPRESSOR) 25 MG tablet Take 25 mg by mouth 2 (two) times daily. Dr Lady Gary  . Multiple Vitamins-Minerals (MULTIVITAMIN WITH MINERALS) tablet Take 1 tablet by mouth daily.  . Omega-3 1000 MG CAPS Take 1 capsule by mouth 2 (two) times daily. otc  . rosuvastatin (CRESTOR) 5 MG tablet Take 1 tablet (5 mg total) by mouth daily.  . vitamin E 400 UNIT capsule Take 400 Units by mouth daily.    PHQ 2/9 Scores 05/16/2019 11/24/2018 11/23/2016 11/11/2015  PHQ - 2 Score 0 0 0 0  PHQ- 9 Score 0 0 - -    BP Readings from Last 3 Encounters:  05/16/19 118/80  05/09/19 137/66  11/24/18 120/62    Physical Exam Vitals signs and nursing note reviewed.  Constitutional:      Appearance: He is normal weight.  HENT:     Head: Normocephalic.     Right Ear: Tympanic membrane, ear canal and external ear normal.     Left Ear: Tympanic membrane, ear canal and external ear normal.     Nose: Nose normal. No congestion or rhinorrhea.     Mouth/Throat:     Mouth: Mucous membranes are moist.  Eyes:     General: No scleral icterus.       Right eye: No discharge.        Left eye: No  discharge.     Conjunctiva/sclera: Conjunctivae normal.     Pupils: Pupils are equal, round, and reactive to light.  Neck:     Musculoskeletal: Normal range of motion and neck supple.     Thyroid: No thyromegaly.     Vascular: No JVD.     Trachea: No tracheal deviation.  Cardiovascular:     Rate and Rhythm: Normal rate and regular rhythm.     Heart sounds: Normal heart sounds. No murmur. No friction rub. No gallop.   Pulmonary:     Effort: No respiratory distress.     Breath sounds: Normal breath sounds. No wheezing, rhonchi or rales.  Abdominal:     General: Bowel sounds are normal.     Palpations: Abdomen is soft. There is no mass.     Tenderness: There is no abdominal tenderness. There is no guarding or rebound.  Musculoskeletal: Normal range of motion.        General: No tenderness.  Lymphadenopathy:     Cervical: No cervical adenopathy.  Skin:    General: Skin is warm.     Capillary Refill: Capillary refill takes less than 2 seconds.     Findings: No rash.  Neurological:     Mental Status: He is alert and oriented to person, place, and time.     Cranial Nerves: No cranial nerve deficit.     Deep Tendon Reflexes: Reflexes are normal and symmetric.     Wt Readings from Last 3 Encounters:  05/16/19 201 lb (91.2 kg)  05/09/19 198 lb (89.8 kg)  11/24/18 215 lb (97.5 kg)    BP 118/80   Pulse (!) 52   Ht 6\' 3"  (1.905 m)   Wt 201 lb (91.2 kg)   BMI 25.12 kg/m   Assessment and Plan:   1. Atypical chest pain Patient evaluated in the emergency room with atypical chest pain.  This was deemed probably secondary to a muscular in nature but patient is continued to have the discomfort over the course of the last couple of weeks.  Patient has multiple risk factors as well as a positive family history for coronary artery disease.  Patient is followed by Dr. Lady Gary and we will refer to Dr. Lady Gary for evaluation of this with the possibility of evaluating with stress echo in the future.  - Ambulatory referral to Cardiology  2. Mixed hyperlipidemia Patient has a history of mixed hyperlipidemia for which it is controlled on rosuvastatin 5 mg once a day.  3. Paroxysmal atrial fibrillation (HCC) This is followed by Dr. Lady Gary and patient is currently taking amiodarone 200 mg 1/2 tablet twice a day.  Rate control is maintained with metoprolol 25 mg twice a day.  4. Influenza vaccine needed Discussed and administered - Flu Vaccine QUAD High Dose(Fluad)

## 2019-05-17 DIAGNOSIS — E782 Mixed hyperlipidemia: Secondary | ICD-10-CM | POA: Diagnosis not present

## 2019-05-17 DIAGNOSIS — R079 Chest pain, unspecified: Secondary | ICD-10-CM | POA: Diagnosis not present

## 2019-05-17 DIAGNOSIS — I480 Paroxysmal atrial fibrillation: Secondary | ICD-10-CM | POA: Diagnosis not present

## 2019-05-23 DIAGNOSIS — C44619 Basal cell carcinoma of skin of left upper limb, including shoulder: Secondary | ICD-10-CM | POA: Diagnosis not present

## 2019-05-31 DIAGNOSIS — R079 Chest pain, unspecified: Secondary | ICD-10-CM | POA: Diagnosis not present

## 2019-06-16 DIAGNOSIS — R0789 Other chest pain: Secondary | ICD-10-CM | POA: Insufficient documentation

## 2019-06-16 DIAGNOSIS — R9430 Abnormal result of cardiovascular function study, unspecified: Secondary | ICD-10-CM | POA: Diagnosis not present

## 2019-06-16 DIAGNOSIS — I480 Paroxysmal atrial fibrillation: Secondary | ICD-10-CM | POA: Diagnosis not present

## 2019-06-16 DIAGNOSIS — R079 Chest pain, unspecified: Secondary | ICD-10-CM | POA: Diagnosis not present

## 2019-06-16 DIAGNOSIS — E782 Mixed hyperlipidemia: Secondary | ICD-10-CM | POA: Diagnosis not present

## 2019-07-05 DIAGNOSIS — R0789 Other chest pain: Secondary | ICD-10-CM | POA: Diagnosis not present

## 2019-07-05 DIAGNOSIS — R9430 Abnormal result of cardiovascular function study, unspecified: Secondary | ICD-10-CM | POA: Diagnosis not present

## 2019-07-05 DIAGNOSIS — Q245 Malformation of coronary vessels: Secondary | ICD-10-CM | POA: Diagnosis not present

## 2019-07-05 DIAGNOSIS — K449 Diaphragmatic hernia without obstruction or gangrene: Secondary | ICD-10-CM | POA: Diagnosis not present

## 2019-07-05 DIAGNOSIS — I2510 Atherosclerotic heart disease of native coronary artery without angina pectoris: Secondary | ICD-10-CM | POA: Diagnosis not present

## 2019-07-06 DIAGNOSIS — M75122 Complete rotator cuff tear or rupture of left shoulder, not specified as traumatic: Secondary | ICD-10-CM | POA: Diagnosis not present

## 2019-07-06 DIAGNOSIS — M25512 Pain in left shoulder: Secondary | ICD-10-CM | POA: Diagnosis not present

## 2019-07-24 DIAGNOSIS — M25512 Pain in left shoulder: Secondary | ICD-10-CM | POA: Diagnosis not present

## 2019-10-30 DIAGNOSIS — M25552 Pain in left hip: Secondary | ICD-10-CM | POA: Diagnosis not present

## 2019-11-02 DIAGNOSIS — M25551 Pain in right hip: Secondary | ICD-10-CM | POA: Diagnosis not present

## 2019-11-03 DIAGNOSIS — S76311A Strain of muscle, fascia and tendon of the posterior muscle group at thigh level, right thigh, initial encounter: Secondary | ICD-10-CM | POA: Diagnosis not present

## 2019-11-03 DIAGNOSIS — M1611 Unilateral primary osteoarthritis, right hip: Secondary | ICD-10-CM | POA: Diagnosis not present

## 2019-11-03 DIAGNOSIS — S73191A Other sprain of right hip, initial encounter: Secondary | ICD-10-CM | POA: Diagnosis not present

## 2019-11-03 DIAGNOSIS — S72001S Fracture of unspecified part of neck of right femur, sequela: Secondary | ICD-10-CM | POA: Diagnosis not present

## 2019-11-03 DIAGNOSIS — M25551 Pain in right hip: Secondary | ICD-10-CM | POA: Diagnosis not present

## 2019-11-03 DIAGNOSIS — S72009S Fracture of unspecified part of neck of unspecified femur, sequela: Secondary | ICD-10-CM | POA: Diagnosis not present

## 2019-11-11 ENCOUNTER — Ambulatory Visit: Payer: Medicare Other

## 2019-11-11 ENCOUNTER — Ambulatory Visit
Admission: EM | Admit: 2019-11-11 | Discharge: 2019-11-11 | Disposition: A | Discharge status: Home / Self Care | Payer: Medicare Other | Attending: Family Medicine | Admitting: Family Medicine

## 2019-11-11 ENCOUNTER — Encounter: Payer: Self-pay | Admitting: Emergency Medicine

## 2019-11-11 ENCOUNTER — Other Ambulatory Visit: Payer: Self-pay

## 2019-11-11 DIAGNOSIS — Y92002 Bathroom of unspecified non-institutional (private) residence single-family (private) house as the place of occurrence of the external cause: Secondary | ICD-10-CM | POA: Diagnosis not present

## 2019-11-11 DIAGNOSIS — W19XXXA Unspecified fall, initial encounter: Secondary | ICD-10-CM

## 2019-11-11 DIAGNOSIS — I10 Essential (primary) hypertension: Secondary | ICD-10-CM | POA: Diagnosis not present

## 2019-11-11 DIAGNOSIS — I4891 Unspecified atrial fibrillation: Secondary | ICD-10-CM | POA: Diagnosis not present

## 2019-11-11 DIAGNOSIS — M25551 Pain in right hip: Secondary | ICD-10-CM | POA: Insufficient documentation

## 2019-11-11 DIAGNOSIS — E785 Hyperlipidemia, unspecified: Secondary | ICD-10-CM | POA: Diagnosis not present

## 2019-11-11 DIAGNOSIS — Z7982 Long term (current) use of aspirin: Secondary | ICD-10-CM | POA: Diagnosis not present

## 2019-11-11 DIAGNOSIS — S79911A Unspecified injury of right hip, initial encounter: Secondary | ICD-10-CM | POA: Diagnosis not present

## 2019-11-11 DIAGNOSIS — Y939 Activity, unspecified: Secondary | ICD-10-CM | POA: Diagnosis not present

## 2019-11-11 DIAGNOSIS — S7001XA Contusion of right hip, initial encounter: Secondary | ICD-10-CM | POA: Insufficient documentation

## 2019-11-11 DIAGNOSIS — Z79899 Other long term (current) drug therapy: Secondary | ICD-10-CM | POA: Diagnosis not present

## 2019-11-11 DIAGNOSIS — W01198A Fall on same level from slipping, tripping and stumbling with subsequent striking against other object, initial encounter: Secondary | ICD-10-CM | POA: Diagnosis not present

## 2019-11-11 NOTE — ED Provider Notes (Signed)
[facility]    CSN: [id number] Arrival date & time: 11/11/19  1214      History   Chief Complaint Chief Complaint  Patient presents with  . Fall  . Hip Pain    right    HPI Seth Stewart. is a 79 y.o. male presents to the urgent care facility for evaluation of right hip pain.  Patient states last night he went to the bathroom to use restroom, slipped on water and fell backwards onto his right side hitting the corner of the tub.  He was able to get up, go back to bed and not having discomfort.  He denies injuring any other part of his body.  No head injury.  No LOC.  This morning upon awakening he describes discomfort and swelling along the right posterior lateral aspect of the greater troches region.  He has no pain with standing but with walking his pain increases.  He also has pain with sitting and then turning to his right side and putting pressure on the area of swelling.  He takes 81 mg aspirin daily.  No other blood thinners.  No bleeding disorders.  He is able to ambulate with no assistive devices.  HPI  Past Medical History:  Diagnosis Date  . AF (atrial fibrillation) ([place])   . Back pain   . Hemorrhoids   . Hyperlipidemia   . Hypertension     Patient Active Problem List   Diagnosis Date Noted  . Right inguinal hernia 12/31/2014  . Atrial fibrillation ([place]) 05/03/2014  . HLD (hyperlipidemia) 05/03/2014  . Lumbar canal stenosis 04/12/2012    Past Surgical History:  Procedure Laterality Date  . ABLATION    . COLONOSCOPY  12/06/2014   Dr. Candace Cruise  . EYE SURGERY  2011  . HERNIA REPAIR Right 01/07/15   right inguinal repair, medium ultra Pro mesh  . LAMINECTOMY  2004       Home Medications    Prior to Admission medications   Medication Sig Start Date End Date Taking? Authorizing Provider  amiodarone (PACERONE) 200 MG tablet Take 0.5 tablets by mouth 2 (two) times daily. Dr Ubaldo Glassing 11/05/15  Yes [provider]  Ascorbic Acid (VITAMIN  C) 1000 MG tablet Take 1,000 mg by mouth daily.   Yes [provider]  aspirin 81 MG tablet Take 81 mg by mouth daily.   Yes [provider]  methylPREDNISolone (MEDROL DOSEPAK) 4 MG TBPK tablet See admin instructions. follow package directions 10/30/19  Yes [provider]  metoprolol tartrate (LOPRESSOR) 25 MG tablet Take 25 mg by mouth 2 (two) times daily. Dr Ubaldo Glassing 10/22/14  Yes [provider]  Multiple Vitamins-Minerals (MULTIVITAMIN WITH MINERALS) tablet Take 1 tablet by mouth daily.   Yes [provider]  Omega-3 1000 MG CAPS Take 1 capsule by mouth 2 (two) times daily. otc   Yes [provider]  rosuvastatin (CRESTOR) 5 MG tablet Take 1 tablet (5 mg total) by mouth daily. 11/24/18  Yes Juline Patch, MD  vitamin E 400 UNIT capsule Take 400 Units by mouth daily.   Yes [provider]  Flaxseed, Linseed, (FLAX SEEDS PO) Take by mouth.    [provider]    Family History Family History  Problem Relation Age of Onset  . Cancer Mother   . Heart disease Father     Social History Social History   Tobacco Use  . Smoking status: Never Smoker  . Smokeless tobacco: Never  Used  Substance Use Topics  . Alcohol use: Yes    Alcohol/week: 2.0 standard drinks    Types: 2 Standard drinks or equivalent per week  . Drug use: No     Allergies   Patient has no known allergies.   Review of Systems Review of Systems  Gastrointestinal: Negative for nausea and vomiting.  Musculoskeletal: Positive for arthralgias, gait problem and joint swelling. Negative for myalgias and neck pain.  Skin: Negative for rash and wound.  Neurological: Negative for headaches.     Physical Exam Triage Vital Signs ED Triage Vitals  Enc Vitals Group     BP 11/11/19 1231 (!) 158/78     Pulse Rate 11/11/19 1231 60     Resp 11/11/19 1231 16     Temp 11/11/19 1231 98.3 F (36.8 C)     Temp Source 11/11/19 1231 Oral     SpO2 11/11/19 1231  100 %     Weight 11/11/19 1227 205 lb (93 kg)     Height 11/11/19 1227 6\' 3"  (1.905 m)     Head Circumference --      Peak Flow --      Pain Score 11/11/19 1227 8     Pain Loc --      Pain Edu? --      Excl. in [place]? --    No data found.  Updated Vital Signs BP (!) 158/78 (BP Location: Left Arm)   Pulse 60   Temp 98.3 F (36.8 C) (Oral)   Resp 16   Ht 6\' 3"  (1.905 m)   Wt 205 lb (93 kg)   SpO2 100%   BMI 25.62 kg/m   Visual Acuity Right Eye Distance:   Left Eye Distance:   Bilateral Distance:    Right Eye Near:   Left Eye Near:    Bilateral Near:     Physical Exam Constitutional:      Appearance: He is well-developed.  HENT:     Head: Normocephalic and atraumatic.  Eyes:     Conjunctiva/sclera: Conjunctivae normal.  Cardiovascular:     Rate and Rhythm: Normal rate.  Pulmonary:     Effort: Pulmonary effort is normal. No respiratory distress.  Musculoskeletal:     Cervical back: Normal range of motion.     Comments: Right hip with small hematoma to the right posterior lateral region, hematoma the size of a lacrosse ball.  Area is nonfluctuant but tender.  Minimal ecchymosis present.  He is able to stand with no discomfort.  No pain with standing.  Has pain with ambulation with no assistive devices.  No antalgic gait.  No limp.  He has full range of motion of right hip with no discomfort.  5 out of 5 strength with hip abduction and adduction with no pain with resistance.  Is nontender along the gluteal region.  She has no groin tenderness.  Neurovascular tact in bilateral lower extremities.  Skin:    General: Skin is warm.     Findings: No rash.  Neurological:     Mental Status: He is alert and oriented to person, place, and time.  Psychiatric:        Behavior: Behavior normal.        Thought Content: Thought content normal.      UC Treatments / Results  Labs (all labs ordered are listed, but only abnormal results are displayed) Labs Reviewed - No data to  display  EKG   Radiology DG Hip Unilat W  or Wo Pelvis 2-3 Views Right  Result Date: 11/11/2019 CLINICAL DATA:  Pt slipped on water and fell last night, hitting right hip on counter. Pain to lateral side. Hx right inguinal hernia repair. EXAM: DG HIP (WITH OR WITHOUT PELVIS) 2-3V RIGHT COMPARISON:  CT 12/13/2014 FINDINGS: Negative for fracture. No dislocation. Bilateral hip DJD. Postop and spondylitic changes in the visualized lower lumbar spine. IMPRESSION: No acute findings. Electronically Signed   By: Lucrezia Europe M.D.   On: 11/11/2019 13:10    Procedures Procedures (including critical care time)  Medications Ordered in UC Medications - No data to display  Initial Impression / Assessment and Plan / UC Course  I have reviewed the triage vital signs and the nursing notes.  Pertinent labs & imaging results that were available during my care of the patient were reviewed by me and considered in my medical decision making (see chart for details).     79 year old male with right hip contusion with hematoma.  X-ray showed no evidence of acute bony abnormality.  Has no pain with standing, no pain with hip internal and external rotation only pain with walking and engaging the gluteus muscles.  Hematoma is small.  Only blood thinner is 81 mg of aspirin daily.  Patient educated on home treatment consisting of compression and ice.  He understands signs and symptoms return to urgent care for. Final Clinical Impressions(s) / UC Diagnoses   Final diagnoses:  Fall, initial encounter  Hematoma of right hip, initial encounter     Discharge Instructions     Please wear compression shorts if possible, apply ice to the area 20 minutes every hour as needed.  Return to the urgent care if any increasing pain, swelling warmth redness or any urgent changes in your health.    ED Prescriptions    None     PDMP not reviewed this encounter.   Duanne Guess, [place] 11/11/19 1317

## 2019-11-11 NOTE — ED Triage Notes (Signed)
Patient states that he fell in the bathroom last night.  Patient states that he thinks he hit the right side of his hip on the corner of the tub.  Patient denies hitting his head.  Patient states that he has a sore knot on the right side of his hip.

## 2019-11-11 NOTE — Discharge Instructions (Addendum)
Please wear compression shorts if possible, apply ice to the area 20 minutes every hour as needed.  Return to the urgent care if any increasing pain, swelling warmth redness or any urgent changes in your health.

## 2019-11-23 DIAGNOSIS — Z85828 Personal history of other malignant neoplasm of skin: Secondary | ICD-10-CM | POA: Diagnosis not present

## 2019-11-23 DIAGNOSIS — L570 Actinic keratosis: Secondary | ICD-10-CM | POA: Diagnosis not present

## 2019-11-23 DIAGNOSIS — D2262 Melanocytic nevi of left upper limb, including shoulder: Secondary | ICD-10-CM | POA: Diagnosis not present

## 2019-11-23 DIAGNOSIS — L538 Other specified erythematous conditions: Secondary | ICD-10-CM | POA: Diagnosis not present

## 2019-11-23 DIAGNOSIS — L820 Inflamed seborrheic keratosis: Secondary | ICD-10-CM | POA: Diagnosis not present

## 2019-11-23 DIAGNOSIS — D2272 Melanocytic nevi of left lower limb, including hip: Secondary | ICD-10-CM | POA: Diagnosis not present

## 2019-11-23 DIAGNOSIS — D225 Melanocytic nevi of trunk: Secondary | ICD-10-CM | POA: Diagnosis not present

## 2019-11-23 DIAGNOSIS — R208 Other disturbances of skin sensation: Secondary | ICD-10-CM | POA: Diagnosis not present

## 2019-11-23 DIAGNOSIS — L821 Other seborrheic keratosis: Secondary | ICD-10-CM | POA: Diagnosis not present

## 2019-12-13 DIAGNOSIS — N400 Enlarged prostate without lower urinary tract symptoms: Secondary | ICD-10-CM | POA: Insufficient documentation

## 2019-12-13 DIAGNOSIS — Z125 Encounter for screening for malignant neoplasm of prostate: Secondary | ICD-10-CM | POA: Insufficient documentation

## 2019-12-13 DIAGNOSIS — I480 Paroxysmal atrial fibrillation: Secondary | ICD-10-CM | POA: Diagnosis not present

## 2019-12-13 DIAGNOSIS — R079 Chest pain, unspecified: Secondary | ICD-10-CM | POA: Diagnosis not present

## 2019-12-13 DIAGNOSIS — R0789 Other chest pain: Secondary | ICD-10-CM | POA: Diagnosis not present

## 2019-12-13 DIAGNOSIS — E782 Mixed hyperlipidemia: Secondary | ICD-10-CM | POA: Diagnosis not present

## 2019-12-13 DIAGNOSIS — N401 Enlarged prostate with lower urinary tract symptoms: Secondary | ICD-10-CM | POA: Diagnosis not present

## 2020-01-02 ENCOUNTER — Telehealth: Payer: Self-pay | Admitting: Family Medicine

## 2020-01-02 NOTE — Telephone Encounter (Signed)
Left message for patient to call back and schedule Medicare Annual Wellness Visit (AWV) either virtually/audio only or in office. Whichever the patients preference is.  Last AWV 06/21/14 PER [place]; please schedule at anytime with [facility] Health Advisor.

## 2020-01-05 ENCOUNTER — Encounter: Payer: Self-pay | Admitting: Family Medicine

## 2020-01-05 ENCOUNTER — Other Ambulatory Visit: Payer: Self-pay

## 2020-01-05 ENCOUNTER — Ambulatory Visit (INDEPENDENT_AMBULATORY_CARE_PROVIDER_SITE_OTHER): Payer: Medicare Other | Admitting: Family Medicine

## 2020-01-05 VITALS — BP 120/80 | HR 64 | Ht 75.0 in | Wt 211.0 lb

## 2020-01-05 DIAGNOSIS — M25461 Effusion, right knee: Secondary | ICD-10-CM | POA: Diagnosis not present

## 2020-01-05 DIAGNOSIS — R296 Repeated falls: Secondary | ICD-10-CM

## 2020-01-05 DIAGNOSIS — E782 Mixed hyperlipidemia: Secondary | ICD-10-CM

## 2020-01-05 NOTE — Patient Instructions (Addendum)

## 2020-01-05 NOTE — Progress Notes (Signed)
Date:  01/05/2020   Name:  Seth Runnion Sr.   DOB:  01-15-1941   MRN:  [id number]   Chief Complaint: Hyperlipidemia  Hyperlipidemia This is a chronic problem. The current episode started more than 1 year ago. The problem is controlled. Recent lipid tests were reviewed and are normal. He has no history of chronic renal disease, diabetes, hypothyroidism, liver disease, obesity or nephrotic syndrome. There are no known factors aggravating his hyperlipidemia. Pertinent negatives include no chest pain, focal sensory loss, focal weakness, leg pain, myalgias or shortness of breath. Current antihyperlipidemic treatment includes statins. The current treatment provides moderate improvement of lipids. Risk factors for coronary artery disease include dyslipidemia.  Neurologic Problem The patient's primary symptoms include clumsiness and a visual change. The patient's pertinent negatives include no altered mental status, focal sensory loss, focal weakness, loss of balance, memory loss, near-syncope, slurred speech, syncope or weakness. Primary symptoms comment: diplopia/distortion/multiple falls realte to activity/hx dysphagia. This is a recurrent problem. The current episode started more than 1 month ago. The problem has been rapidly worsening since onset. Associated symptoms include fatigue. Pertinent negatives include no abdominal pain, back pain, chest pain, confusion, dizziness, fever, headaches, light-headedness, nausea, neck pain, palpitations, shortness of breath or vertigo. There is no history of liver disease.  Knee Pain  The incident occurred more than 1 week ago. The injury mechanism was a fall. The pain is present in the right knee. The quality of the pain is described as aching (swelling). Pertinent negatives include no inability to bear weight, loss of motion, loss of sensation, muscle weakness, numbness or tingling.    Lab Results  Component Value Date   CREATININE 0.86 05/09/2019   BUN 15 05/09/2019   NA 134 (L) 05/09/2019   K 3.9 05/09/2019   CL 104 05/09/2019   CO2 25 05/09/2019   Lab Results  Component Value Date   CHOL 186 11/23/2016   HDL 78 11/23/2016   LDLCALC 97 11/23/2016   TRIG 56 11/23/2016   CHOLHDL 2.4 11/23/2016   No results found for: TSH No results found for: HGBA1C Lab Results  Component Value Date   WBC 8.9 05/09/2019   HGB 13.6 05/09/2019   HCT 39.3 05/09/2019   MCV 92.9 05/09/2019   PLT 190 05/09/2019   Lab Results  Component Value Date   ALT 13 11/11/2015   AST 17 11/11/2015   ALKPHOS 45 11/11/2015   BILITOT 0.8 11/11/2015     Review of Systems  Constitutional: Positive for fatigue. Negative for chills and fever.  HENT: Negative for drooling, ear discharge, ear pain and sore throat.   Respiratory: Negative for cough, shortness of breath and wheezing.   Cardiovascular: Negative for chest pain, palpitations, leg swelling and near-syncope.  Gastrointestinal: Negative for abdominal pain, blood in stool, constipation, diarrhea and nausea.  Endocrine: Negative for polydipsia.  Genitourinary: Negative for dysuria, frequency, hematuria and urgency.  Musculoskeletal: Negative for back pain, myalgias and neck pain.  Skin: Negative for rash.  Allergic/Immunologic: Negative for environmental allergies.  Neurological: Negative for dizziness, vertigo, tingling, focal weakness, syncope, weakness, light-headedness, numbness, headaches and loss of balance.  Hematological: Does not bruise/bleed easily.  Psychiatric/Behavioral: Negative for confusion, memory loss and suicidal ideas. The patient is not nervous/anxious.     Patient Active Problem List   Diagnosis Date Noted  . Right inguinal hernia 12/31/2014  . Atrial fibrillation (HCC) 05/03/2014  . HLD (hyperlipidemia) 05/03/2014  . Lumbar canal stenosis 04/12/2012  No Known Allergies  Past Surgical History:  Procedure Laterality Date  . ABLATION    . COLONOSCOPY  12/06/2014    [name]  . EYE SURGERY  2011  . HERNIA REPAIR Right 01/07/15   right inguinal repair, medium ultra Pro mesh  . LAMINECTOMY  2004    Social History   Tobacco Use  . Smoking status: Never Smoker  . Smokeless tobacco: Never Used  Substance Use Topics  . Alcohol use: Yes    Alcohol/week: 2.0 standard drinks    Types: 2 Standard drinks or equivalent per week  . Drug use: No     Medication list has been reviewed and updated.  Current Meds  Medication Sig  . amiodarone (PACERONE) 200 MG tablet Take 0.5 tablets by mouth 2 (two) times daily. Dr Lady Gary  . Ascorbic Acid (VITAMIN C) 1000 MG tablet Take 1,000 mg by mouth daily.  Marland Kitchen aspirin 81 MG tablet Take 81 mg by mouth daily.  . Flaxseed, Linseed, (FLAX SEEDS PO) Take by mouth.  . metoprolol tartrate (LOPRESSOR) 25 MG tablet Take 25 mg by mouth 2 (two) times daily. Dr Lady Gary  . Multiple Vitamins-Minerals (MULTIVITAMIN WITH MINERALS) tablet Take 1 tablet by mouth daily.  . Omega-3 1000 MG CAPS Take 1 capsule by mouth 2 (two) times daily. otc  . rosuvastatin (CRESTOR) 5 MG tablet Take 1 tablet (5 mg total) by mouth daily.  . vitamin E 400 UNIT capsule Take 400 Units by mouth daily.    PHQ 2/9 Scores 01/05/2020 05/16/2019 11/24/2018 11/23/2016  PHQ - 2 Score 0 0 0 0  PHQ- 9 Score 0 0 0 -    BP Readings from Last 3 Encounters:  01/05/20 120/80  11/11/19 (!) 158/78  05/16/19 118/80    Physical Exam Vitals and nursing note reviewed.  HENT:     Head: Normocephalic.     Right Ear: Tympanic membrane, ear canal and external ear normal.     Left Ear: Tympanic membrane, ear canal and external ear normal.     Nose: Nose normal. No congestion or rhinorrhea.  Eyes:     General: No scleral icterus.       Right eye: No discharge.        Left eye: No discharge.     Conjunctiva/sclera: Conjunctivae normal.     Pupils: Pupils are equal, round, and reactive to light.  Neck:     Thyroid: No thyromegaly.     Vascular: No JVD.     Trachea: No tracheal  deviation.  Cardiovascular:     Rate and Rhythm: Normal rate and regular rhythm.     Heart sounds: Normal heart sounds. No murmur. No friction rub. No gallop.   Pulmonary:     Effort: No respiratory distress.     Breath sounds: Normal breath sounds. No wheezing, rhonchi or rales.  Abdominal:     General: Bowel sounds are normal.     Palpations: Abdomen is soft. There is no mass.     Tenderness: There is no abdominal tenderness. There is no guarding or rebound.  Musculoskeletal:        General: No tenderness. Normal range of motion.     Cervical back: Normal range of motion and neck supple.  Lymphadenopathy:     Cervical: No cervical adenopathy.  Skin:    General: Skin is warm.     Findings: No rash.  Neurological:     Mental Status: He is alert and oriented to person, place, and  time.     Cranial Nerves: Cranial nerves are intact. No cranial nerve deficit.     Sensory: Sensation is intact.     Motor: Motor function is intact.     Deep Tendon Reflexes: Reflexes are normal and symmetric.     Wt Readings from Last 3 Encounters:  01/05/20 211 lb (95.7 kg)  11/11/19 205 lb (93 kg)  05/16/19 201 lb (91.2 kg)    BP 120/80   Pulse 64   Ht 6\' 3"  (1.905 m)   Wt 211 lb (95.7 kg)   BMI 26.37 kg/m   Assessment and Plan:  1. Mixed hyperlipidemia Chronic.  Relatively controlled.  Patient is only on 5 mg of Crestor and I am having some concerns about some muscle issues.  Even though he has some at risk for cholesterol his HDL is well in normal limits which reduces his risk.  I would like to stop his Crestor 5 mg at this time recheck it in 4 to 6 weeks and see if there is any change in status as well as elevation of LDL.  2. Multiple falls New onset.  Patient has had 3-4 falls this year.  Complicated.  Patient uncharacteristically has been falling sustaining rotator cuff concerns in both shoulders injuries to his hip and his right knee.  These falls occur usually with activities that  are either strenuous or sustained.  This is very unusual for the patient and I have concerns of a neuromuscular circumstance.  Patient has also had diplopia, history of dysphagia, other visual disturbances, and more fatigue.  We will refer to neurology for evaluation of these concerns to rule out the possibility of a neuromuscular circumstance such as myasthenia or MS. - Ambulatory referral to Neurology  3. Effusion of right prepatellar bursa Patient had a fall involving the right knee in [place].  Has continued to have swelling of the area in the prepatellar aspect.  Patient will bring this to the attention of the orthopedist that this can be aspirated.

## 2020-01-25 DIAGNOSIS — R2689 Other abnormalities of gait and mobility: Secondary | ICD-10-CM | POA: Insufficient documentation

## 2020-01-25 DIAGNOSIS — R270 Ataxia, unspecified: Secondary | ICD-10-CM | POA: Insufficient documentation

## 2020-01-25 DIAGNOSIS — E538 Deficiency of other specified B group vitamins: Secondary | ICD-10-CM | POA: Diagnosis not present

## 2020-01-25 DIAGNOSIS — E519 Thiamine deficiency, unspecified: Secondary | ICD-10-CM | POA: Diagnosis not present

## 2020-01-25 DIAGNOSIS — E559 Vitamin D deficiency, unspecified: Secondary | ICD-10-CM | POA: Diagnosis not present

## 2020-01-25 DIAGNOSIS — Z79899 Other long term (current) drug therapy: Secondary | ICD-10-CM | POA: Diagnosis not present

## 2020-01-25 DIAGNOSIS — R790 Abnormal level of blood mineral: Secondary | ICD-10-CM | POA: Diagnosis not present

## 2020-01-29 DIAGNOSIS — M65351 Trigger finger, right little finger: Secondary | ICD-10-CM | POA: Diagnosis not present

## 2020-01-29 DIAGNOSIS — M25512 Pain in left shoulder: Secondary | ICD-10-CM | POA: Diagnosis not present

## 2020-03-01 IMAGING — CR CHEST - 2 VIEW
1 series · 2 of 2 positions shown · non-contrast
Comparison: CT abdomen pelvis 12/13/2014, chest radiograph
08/17/2010

CLINICAL DATA: Left-sided chest pain

EXAM:
CHEST - 2 VIEW

[Series 1: dg chest 2 view · 0.14mm/px · 2 of 2 slices shown]
[im 1/2]
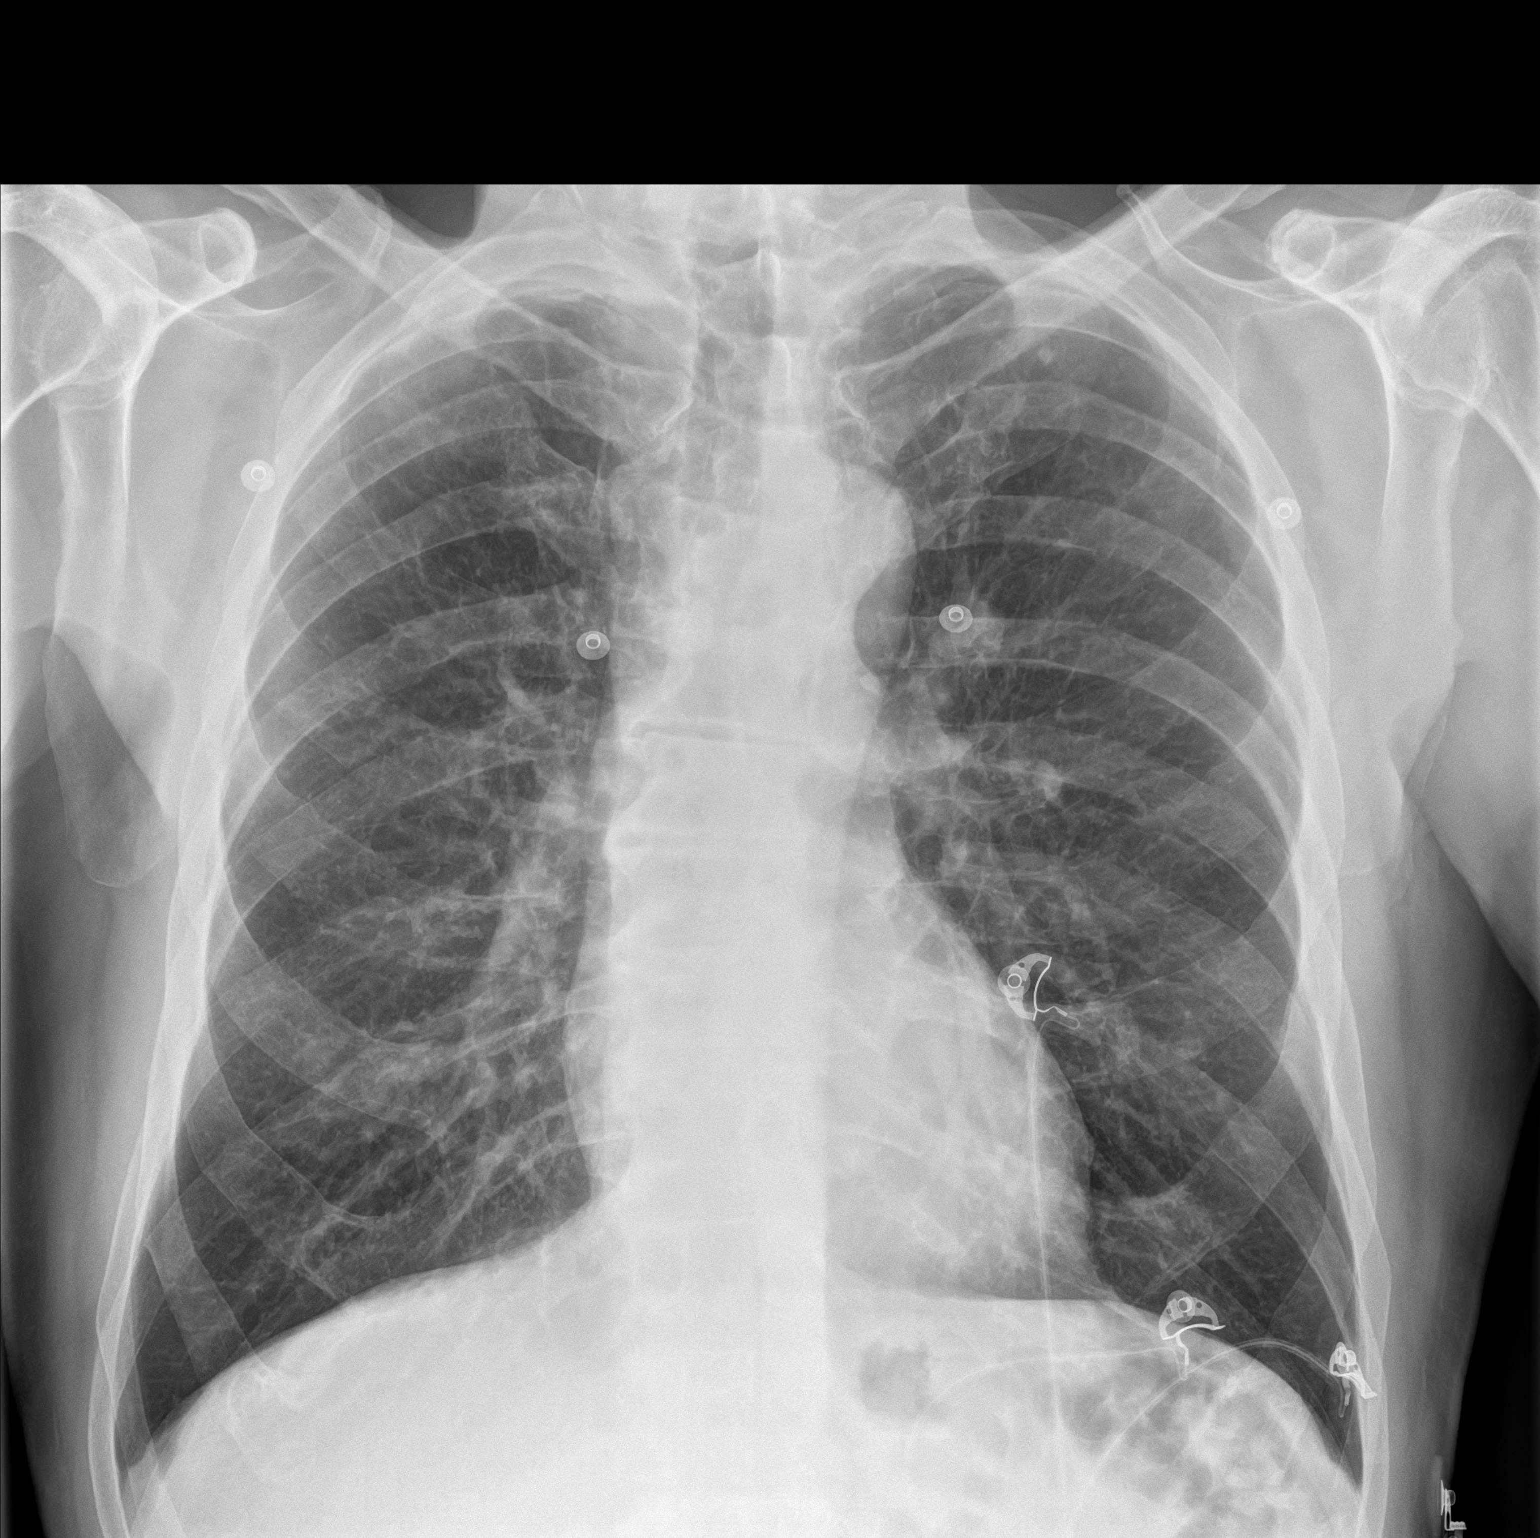
[im 2/2]
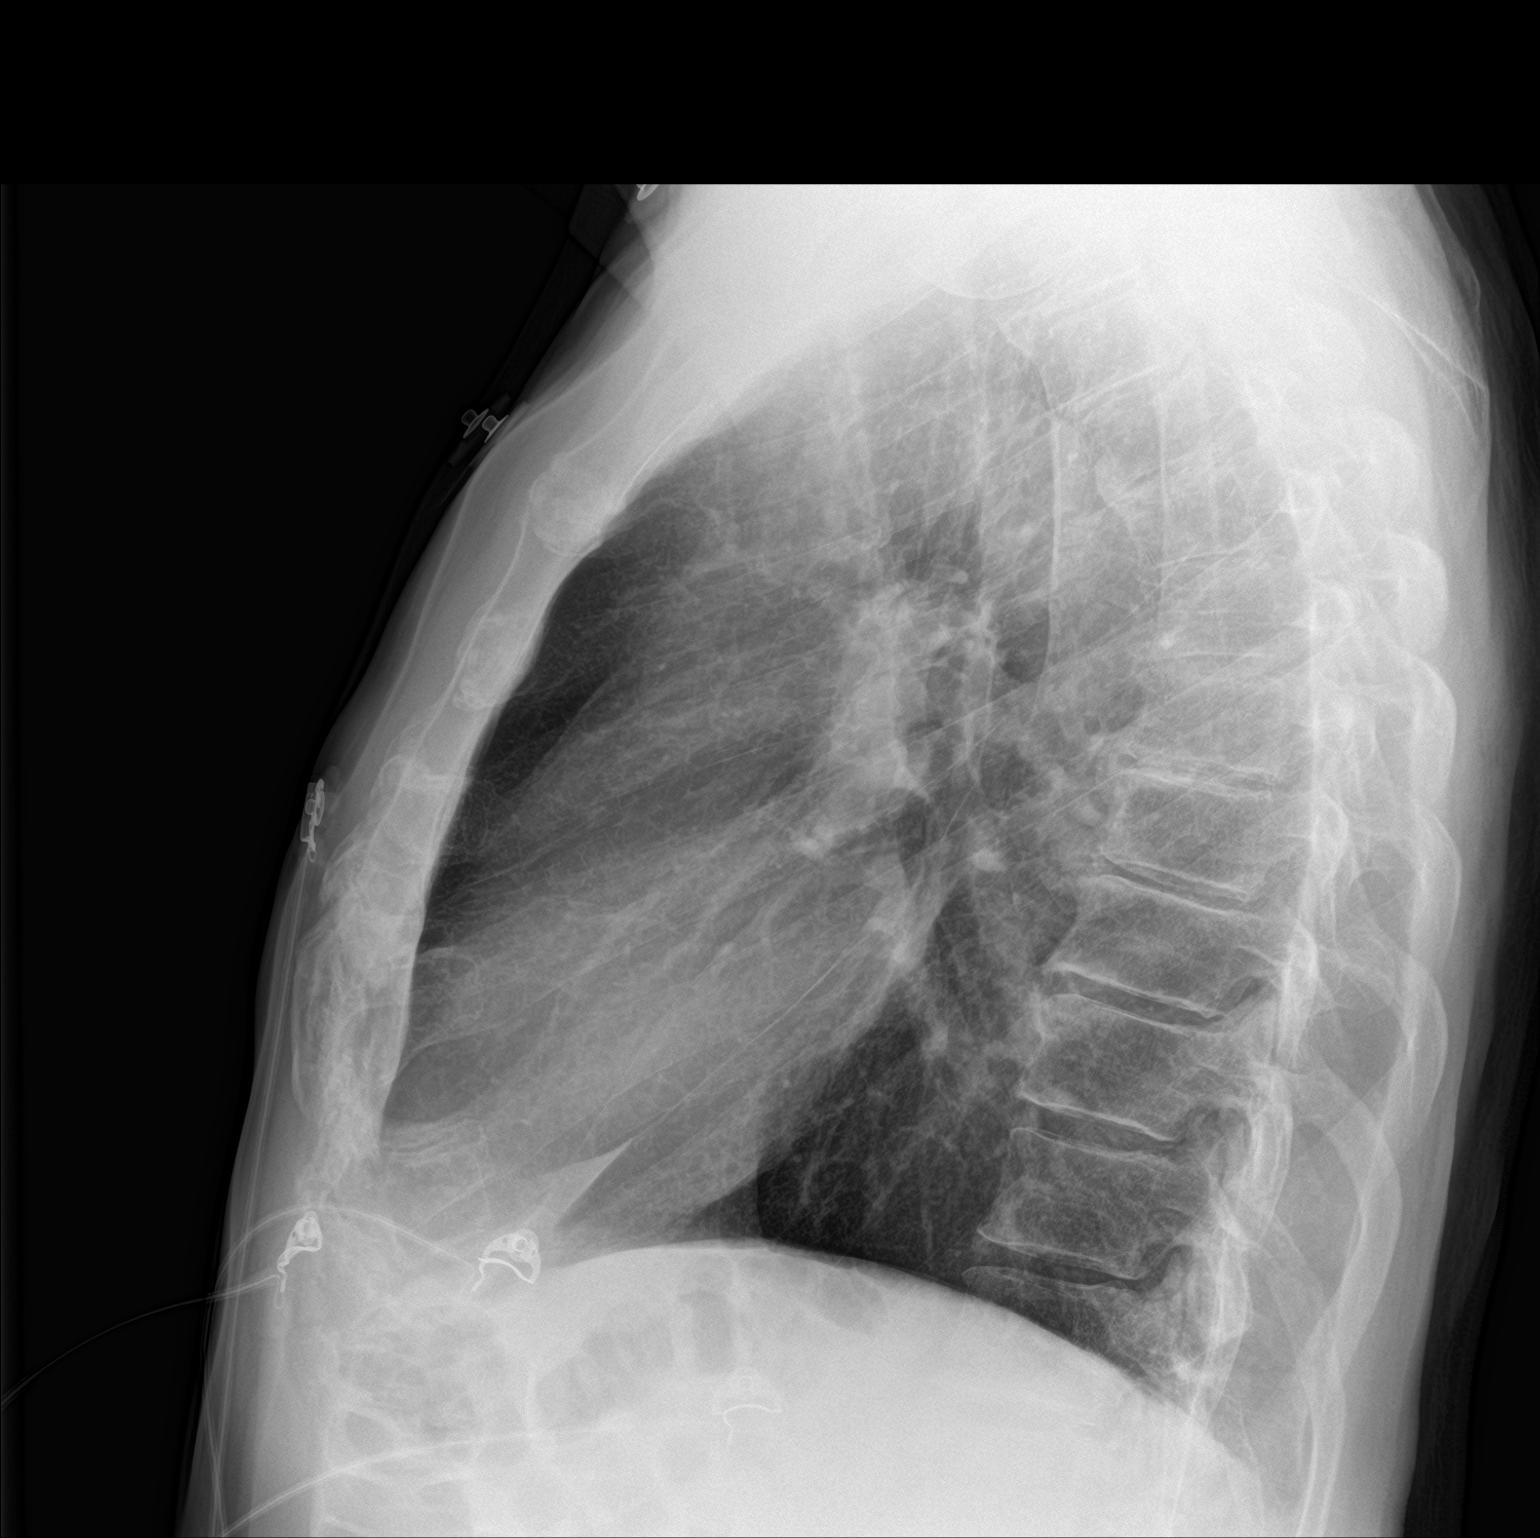

[2 of 2 positions shown; findings below may reference images not displayed]

FINDINGS: Coarse interstitial changes are similar to prior. No consolidation,
features of edema, pneumothorax, or effusion. Pulmonary vascularity
is normally distributed. The cardiomediastinal contours are
unremarkable. No acute osseous or soft tissue abnormality.
Degenerative changes are present in the imaged spine and shoulders.
IMPRESSION: No acute cardiopulmonary disease.

## 2020-03-21 DIAGNOSIS — M545 Low back pain: Secondary | ICD-10-CM | POA: Diagnosis not present

## 2020-03-21 DIAGNOSIS — R296 Repeated falls: Secondary | ICD-10-CM | POA: Diagnosis not present

## 2020-03-27 DIAGNOSIS — G629 Polyneuropathy, unspecified: Secondary | ICD-10-CM | POA: Insufficient documentation

## 2020-03-27 DIAGNOSIS — G588 Other specified mononeuropathies: Secondary | ICD-10-CM | POA: Insufficient documentation

## 2020-03-27 DIAGNOSIS — R2689 Other abnormalities of gait and mobility: Secondary | ICD-10-CM | POA: Diagnosis not present

## 2020-03-27 DIAGNOSIS — R270 Ataxia, unspecified: Secondary | ICD-10-CM | POA: Diagnosis not present

## 2020-04-02 DIAGNOSIS — R296 Repeated falls: Secondary | ICD-10-CM | POA: Insufficient documentation

## 2020-04-02 DIAGNOSIS — M545 Low back pain, unspecified: Secondary | ICD-10-CM | POA: Insufficient documentation

## 2020-04-11 ENCOUNTER — Telehealth: Payer: Self-pay | Admitting: Family Medicine

## 2020-04-11 ENCOUNTER — Other Ambulatory Visit: Payer: Medicare Other

## 2020-04-11 ENCOUNTER — Other Ambulatory Visit: Payer: Self-pay

## 2020-04-11 DIAGNOSIS — E782 Mixed hyperlipidemia: Secondary | ICD-10-CM

## 2020-04-11 NOTE — Telephone Encounter (Unsigned)
Copied from [id number] [id number] [id number]. Topic: General - Other >> Apr 11, 2020 11:10 AM Celene Kras wrote: Reason for CRM: Pt called and is requesting to have lab orders placed to have cholesterol checked due to be taken off the medication. Please advise .

## 2020-04-11 NOTE — Telephone Encounter (Signed)
Please put on schedule and hit arrived

## 2020-04-12 ENCOUNTER — Encounter (INDEPENDENT_AMBULATORY_CARE_PROVIDER_SITE_OTHER): Payer: Medicare Other | Admitting: Ophthalmology

## 2020-04-12 ENCOUNTER — Other Ambulatory Visit: Payer: Self-pay

## 2020-04-12 DIAGNOSIS — H43813 Vitreous degeneration, bilateral: Secondary | ICD-10-CM | POA: Diagnosis not present

## 2020-04-12 DIAGNOSIS — H35373 Puckering of macula, bilateral: Secondary | ICD-10-CM | POA: Diagnosis not present

## 2020-04-16 DIAGNOSIS — E782 Mixed hyperlipidemia: Secondary | ICD-10-CM | POA: Diagnosis not present

## 2020-04-17 LAB — LIPID PANEL WITH LDL/HDL RATIO
Cholesterol, Total: 261 mg/dL — ABNORMAL HIGH (ref 100–199)
HDL: 76 mg/dL (ref >39)
LDL Chol Calc (NIH): 171 mg/dL — ABNORMAL HIGH (ref 0–99)
LDL/HDL Ratio: 2.3 ratio (ref 0.0–3.6)
Triglycerides: 84 mg/dL (ref 0–149)
VLDL Cholesterol Cal: 14 mg/dL (ref 5–40)

## 2020-04-19 ENCOUNTER — Other Ambulatory Visit: Payer: Self-pay

## 2020-04-19 DIAGNOSIS — E782 Mixed hyperlipidemia: Secondary | ICD-10-CM

## 2020-04-19 MED ORDER — ROSUVASTATIN CALCIUM 5 MG PO TABS: 5.0000 mg | ORAL_TABLET | Freq: Every day | ORAL | 1 refills | Status: DC

## 2020-04-19 NOTE — Progress Notes (Unsigned)
Sent in crestor 5mg  to [facility] [address]

## 2020-05-25 DIAGNOSIS — Z23 Encounter for immunization: Secondary | ICD-10-CM | POA: Diagnosis not present

## 2020-05-29 DIAGNOSIS — I480 Paroxysmal atrial fibrillation: Secondary | ICD-10-CM | POA: Diagnosis not present

## 2020-05-29 DIAGNOSIS — E782 Mixed hyperlipidemia: Secondary | ICD-10-CM | POA: Diagnosis not present

## 2020-05-29 DIAGNOSIS — R079 Chest pain, unspecified: Secondary | ICD-10-CM | POA: Diagnosis not present

## 2020-06-05 DIAGNOSIS — Z23 Encounter for immunization: Secondary | ICD-10-CM | POA: Diagnosis not present

## 2020-06-10 ENCOUNTER — Telehealth: Payer: Self-pay | Admitting: Family Medicine

## 2020-06-10 NOTE — Telephone Encounter (Signed)
NO ANSWER/NO VM ON BOTH NUMBERS. Pt due to schedule Medicare Annual Wellness Visit (AWV) either virtually/audio only or in office. Whichever the patients preference is.  No history of AWV; please schedule at anytime with [facility] Health Advisor.  This should be a 40 minute visit

## 2020-07-15 ENCOUNTER — Other Ambulatory Visit: Payer: Self-pay | Admitting: Family Medicine

## 2020-07-15 DIAGNOSIS — E782 Mixed hyperlipidemia: Secondary | ICD-10-CM

## 2020-07-15 NOTE — Telephone Encounter (Signed)
Requested medication (s) are due for refill today: yes  Requested medication (s) are on the active medication list:yes  Last refill:  04/19/20  #90  1 refill  Future visit scheduled: No  Notes to clinic: Per pharmacy no refills remain.  Need Dx code.    Requested Prescriptions  Pending Prescriptions Disp Refills   rosuvastatin (CRESTOR) 5 MG tablet [Pharmacy Med Name: ROSUVASTATIN CALCIUM 5 MG TAB] 90 tablet 1    Sig: TAKE 1 TABLET BY MOUTH EVERY DAY      Cardiovascular:  Antilipid - Statins Failed - 07/15/2020 12:32 PM      Failed - Total Cholesterol in normal range and within 360 days    Cholesterol, Total  Date Value Ref Range Status  04/16/2020 261 (H) 100 - 199 mg/dL Final          Failed - LDL in normal range and within 360 days    LDL Chol Calc (NIH)  Date Value Ref Range Status  04/16/2020 171 (H) 0 - 99 mg/dL Final          Passed - HDL in normal range and within 360 days    HDL  Date Value Ref Range Status  04/16/2020 76 >39 mg/dL Final          Passed - Triglycerides in normal range and within 360 days    Triglycerides  Date Value Ref Range Status  04/16/2020 84 0 - 149 mg/dL Final          Passed - Patient is not pregnant      Passed - Valid encounter within last 12 months    Recent Outpatient Visits           6 months ago Multiple falls   [facility] Juline Patch, MD   1 year ago Atypical chest pain   [facility] Juline Patch, MD   1 year ago Mixed hyperlipidemia   [facility] Juline Patch, MD   2 years ago Abdominal swelling, left lower quadrant   [facility] Juline Patch, MD   3 years ago Annual physical exam   [facility] Juline Patch, MD

## 2020-07-31 ENCOUNTER — Ambulatory Visit (INDEPENDENT_AMBULATORY_CARE_PROVIDER_SITE_OTHER): Payer: Medicare Other | Admitting: Family Medicine

## 2020-07-31 ENCOUNTER — Encounter: Payer: Self-pay | Admitting: Family Medicine

## 2020-07-31 ENCOUNTER — Other Ambulatory Visit: Payer: Self-pay

## 2020-07-31 VITALS — BP 130/70 | HR 60 | Ht 75.0 in | Wt 213.0 lb

## 2020-07-31 DIAGNOSIS — E782 Mixed hyperlipidemia: Secondary | ICD-10-CM

## 2020-07-31 MED ORDER — ROSUVASTATIN CALCIUM 5 MG PO TABS: 5.0000 mg | ORAL_TABLET | Freq: Every day | ORAL | 1 refills | Status: DC

## 2020-07-31 NOTE — Progress Notes (Signed)
Date:  07/31/2020   Name:  Seth Cubias Sr.   DOB:  Nov 28, 1940   MRN:  [id number]   Chief Complaint: Hyperlipidemia  Hyperlipidemia This is a chronic problem. The current episode started more than 1 year ago. The problem is controlled. Recent lipid tests were reviewed and are normal. He has no history of chronic renal disease, diabetes, hypothyroidism, liver disease, obesity or nephrotic syndrome. There are no known factors aggravating his hyperlipidemia. Pertinent negatives include no chest pain, focal sensory loss, focal weakness, leg pain, myalgias or shortness of breath. Current antihyperlipidemic treatment includes statins. The current treatment provides moderate improvement of lipids. There are no compliance problems.  Risk factors for coronary artery disease include dyslipidemia and hypertension.    Lab Results  Component Value Date   CREATININE 0.86 05/09/2019   BUN 15 05/09/2019   NA 134 (L) 05/09/2019   K 3.9 05/09/2019   CL 104 05/09/2019   CO2 25 05/09/2019   Lab Results  Component Value Date   CHOL 261 (H) 04/16/2020   HDL 76 04/16/2020   LDLCALC 171 (H) 04/16/2020   TRIG 84 04/16/2020   CHOLHDL 2.4 11/23/2016   No results found for: TSH No results found for: HGBA1C Lab Results  Component Value Date   WBC 8.9 05/09/2019   HGB 13.6 05/09/2019   HCT 39.3 05/09/2019   MCV 92.9 05/09/2019   PLT 190 05/09/2019   Lab Results  Component Value Date   ALT 13 11/11/2015   AST 17 11/11/2015   ALKPHOS 45 11/11/2015   BILITOT 0.8 11/11/2015     Review of Systems  Constitutional: Negative for chills and fever.  HENT: Negative for drooling, ear discharge, ear pain and sore throat.   Respiratory: Negative for cough, shortness of breath and wheezing.   Cardiovascular: Negative for chest pain, palpitations and leg swelling.  Gastrointestinal: Negative for abdominal pain, blood in stool, constipation, diarrhea and nausea.  Endocrine: Negative for  polydipsia.  Genitourinary: Negative for dysuria, frequency, hematuria and urgency.  Musculoskeletal: Negative for back pain, myalgias and neck pain.  Skin: Negative for rash.  Allergic/Immunologic: Negative for environmental allergies.  Neurological: Negative for dizziness, focal weakness and headaches.  Hematological: Does not bruise/bleed easily.  Psychiatric/Behavioral: Negative for suicidal ideas. The patient is not nervous/anxious.     Patient Active Problem List   Diagnosis Date Noted  . Right inguinal hernia 12/31/2014  . Atrial fibrillation (HCC) 05/03/2014  . HLD (hyperlipidemia) 05/03/2014  . Lumbar canal stenosis 04/12/2012    No Known Allergies  Past Surgical History:  Procedure Laterality Date  . ABLATION    . COLONOSCOPY  12/06/2014   [name]  . EYE SURGERY  2011  . HERNIA REPAIR Right 01/07/15   right inguinal repair, medium ultra Pro mesh  . LAMINECTOMY  2004    Social History   Tobacco Use  . Smoking status: Never Smoker  . Smokeless tobacco: Never Used  Vaping Use  . Vaping Use: Never used  Substance Use Topics  . Alcohol use: Yes    Alcohol/week: 2.0 standard drinks    Types: 2 Standard drinks or equivalent per week  . Drug use: No     Medication list has been reviewed and updated.  Current Meds  Medication Sig  . amiodarone (PACERONE) 200 MG tablet Take 0.5 tablets by mouth 2 (two) times daily. Dr Lady Gary  . Ascorbic Acid (VITAMIN C) 1000 MG tablet Take 1,000 mg by mouth daily.  Marland Kitchen  aspirin 81 MG tablet Take 81 mg by mouth daily.  . Flaxseed, Linseed, (FLAX SEEDS PO) Take by mouth.  . metoprolol tartrate (LOPRESSOR) 25 MG tablet Take 25 mg by mouth 2 (two) times daily. Dr Lady Gary  . Multiple Vitamins-Minerals (MULTIVITAMIN WITH MINERALS) tablet Take 1 tablet by mouth daily.  . Omega-3 1000 MG CAPS Take 1 capsule by mouth 2 (two) times daily. otc  . rosuvastatin (CRESTOR) 5 MG tablet TAKE 1 TABLET BY MOUTH EVERY DAY  . vitamin E 400 UNIT capsule Take  400 Units by mouth daily.    PHQ 2/9 Scores 07/31/2020 01/05/2020 05/16/2019 11/24/2018  PHQ - 2 Score 0 0 0 0  PHQ- 9 Score 0 0 0 0    GAD 7 : Generalized Anxiety Score 07/31/2020 01/05/2020  Nervous, Anxious, on Edge 0 0  Control/stop worrying 0 0  Worry too much - different things 0 0  Trouble relaxing 0 0  Restless 0 0  Easily annoyed or irritable 0 0  Afraid - awful might happen 0 0  Total GAD 7 Score 0 0    BP Readings from Last 3 Encounters:  07/31/20 130/70  01/05/20 120/80  11/11/19 (!) 158/78    Physical Exam Vitals and nursing note reviewed.  HENT:     Head: Normocephalic.     Right Ear: Tympanic membrane, ear canal and external ear normal. There is no impacted cerumen.     Left Ear: Tympanic membrane, ear canal and external ear normal. There is no impacted cerumen.     Nose: Nose normal. No congestion or rhinorrhea.  Eyes:     General: No scleral icterus.       Right eye: No discharge.        Left eye: No discharge.     Conjunctiva/sclera: Conjunctivae normal.     Pupils: Pupils are equal, round, and reactive to light.  Neck:     Thyroid: No thyromegaly.     Vascular: No JVD.     Trachea: No tracheal deviation.  Cardiovascular:     Rate and Rhythm: Normal rate and regular rhythm.     Heart sounds: Normal heart sounds. No murmur heard.  No friction rub. No gallop.   Pulmonary:     Effort: No respiratory distress.     Breath sounds: Normal breath sounds. No stridor. No wheezing, rhonchi or rales.  Chest:     Chest wall: No tenderness.  Abdominal:     General: Bowel sounds are normal.     Palpations: Abdomen is soft. There is no mass.     Tenderness: There is no abdominal tenderness. There is no guarding or rebound.  Musculoskeletal:        General: No tenderness. Normal range of motion.     Cervical back: Normal range of motion and neck supple.  Lymphadenopathy:     Cervical: No cervical adenopathy.  Skin:    General: Skin is warm.     Capillary  Refill: Capillary refill takes less than 2 seconds.     Findings: No rash.  Neurological:     Mental Status: He is alert and oriented to person, place, and time.     Cranial Nerves: No cranial nerve deficit.     Deep Tendon Reflexes: Reflexes are normal and symmetric.     Wt Readings from Last 3 Encounters:  07/31/20 213 lb (96.6 kg)  01/05/20 211 lb (95.7 kg)  11/11/19 205 lb (93 kg)    BP 130/70  Pulse 60   Ht 6\' 3"  (1.905 m)   Wt 213 lb (96.6 kg)   BMI 26.62 kg/m   Assessment and Plan: Patient's previous encounter was reviewed. 1. Mixed hyperlipidemia Chronic.  Controlled.  Stable.  We will continue rosuvastatin 5 mg once a day.  Will check lipid panel for current status. - rosuvastatin (CRESTOR) 5 MG tablet; Take 1 tablet (5 mg total) by mouth daily.  Dispense: 90 tablet; Refill: 1 - Lipid Panel With LDL/HDL Ratio

## 2020-08-01 DIAGNOSIS — M65351 Trigger finger, right little finger: Secondary | ICD-10-CM | POA: Diagnosis not present

## 2020-08-01 DIAGNOSIS — M25511 Pain in right shoulder: Secondary | ICD-10-CM | POA: Diagnosis not present

## 2020-08-01 LAB — LIPID PANEL WITH LDL/HDL RATIO
Cholesterol, Total: 209 mg/dL — ABNORMAL HIGH (ref 100–199)
HDL: 97 mg/dL (ref >39)
LDL Chol Calc (NIH): 97 mg/dL (ref 0–99)
LDL/HDL Ratio: 1 ratio (ref 0.0–3.6)
Triglycerides: 89 mg/dL (ref 0–149)
VLDL Cholesterol Cal: 15 mg/dL (ref 5–40)

## 2020-09-03 DIAGNOSIS — D2262 Melanocytic nevi of left upper limb, including shoulder: Secondary | ICD-10-CM | POA: Diagnosis not present

## 2020-09-03 DIAGNOSIS — C44612 Basal cell carcinoma of skin of right upper limb, including shoulder: Secondary | ICD-10-CM | POA: Diagnosis not present

## 2020-09-03 DIAGNOSIS — L570 Actinic keratosis: Secondary | ICD-10-CM | POA: Diagnosis not present

## 2020-09-03 DIAGNOSIS — D225 Melanocytic nevi of trunk: Secondary | ICD-10-CM | POA: Diagnosis not present

## 2020-09-03 DIAGNOSIS — L820 Inflamed seborrheic keratosis: Secondary | ICD-10-CM | POA: Diagnosis not present

## 2020-09-03 DIAGNOSIS — D485 Neoplasm of uncertain behavior of skin: Secondary | ICD-10-CM | POA: Diagnosis not present

## 2020-09-03 DIAGNOSIS — D2272 Melanocytic nevi of left lower limb, including hip: Secondary | ICD-10-CM | POA: Diagnosis not present

## 2020-09-03 DIAGNOSIS — L538 Other specified erythematous conditions: Secondary | ICD-10-CM | POA: Diagnosis not present

## 2020-09-03 DIAGNOSIS — D2261 Melanocytic nevi of right upper limb, including shoulder: Secondary | ICD-10-CM | POA: Diagnosis not present

## 2020-09-03 DIAGNOSIS — Z85828 Personal history of other malignant neoplasm of skin: Secondary | ICD-10-CM | POA: Diagnosis not present

## 2020-09-06 DIAGNOSIS — R270 Ataxia, unspecified: Secondary | ICD-10-CM | POA: Diagnosis not present

## 2020-09-06 DIAGNOSIS — R2689 Other abnormalities of gait and mobility: Secondary | ICD-10-CM | POA: Diagnosis not present

## 2020-09-06 DIAGNOSIS — M545 Low back pain, unspecified: Secondary | ICD-10-CM | POA: Diagnosis not present

## 2020-09-06 DIAGNOSIS — R252 Cramp and spasm: Secondary | ICD-10-CM | POA: Diagnosis not present

## 2020-09-07 DIAGNOSIS — R252 Cramp and spasm: Secondary | ICD-10-CM | POA: Insufficient documentation

## 2020-09-25 DIAGNOSIS — C44612 Basal cell carcinoma of skin of right upper limb, including shoulder: Secondary | ICD-10-CM | POA: Diagnosis not present

## 2020-09-30 ENCOUNTER — Telehealth: Payer: Self-pay

## 2020-09-30 NOTE — Telephone Encounter (Signed)
Sent in Sue's meds

## 2020-09-30 NOTE — Telephone Encounter (Unsigned)
Copied from [place] [id number]. Topic: General - Inquiry >> Sep 30, 2020 12:45 PM Greggory Keen D wrote: Reason for CRM: Pt would like Dr. Ronnald Ramp nurse to call him back regarding some prescriptions that he needs.  CB#  [phone]

## 2020-10-01 ENCOUNTER — Telehealth: Payer: Self-pay | Admitting: Family Medicine

## 2020-10-01 NOTE — Telephone Encounter (Signed)
Pt will call back when he has better service. Pt due to schedule Medicare Annual Wellness Visit (AWV) either virtually or in office. Whichever the patients preference is.  LAST AWV WAS 06/2014; please schedule at anytime with [facility] Health Advisor.  This should be a 40 minute visit

## 2020-10-15 DIAGNOSIS — M419 Scoliosis, unspecified: Secondary | ICD-10-CM | POA: Diagnosis not present

## 2020-10-15 DIAGNOSIS — M4807 Spinal stenosis, lumbosacral region: Secondary | ICD-10-CM | POA: Diagnosis not present

## 2020-10-15 DIAGNOSIS — M48061 Spinal stenosis, lumbar region without neurogenic claudication: Secondary | ICD-10-CM | POA: Diagnosis not present

## 2020-10-15 DIAGNOSIS — M5116 Intervertebral disc disorders with radiculopathy, lumbar region: Secondary | ICD-10-CM | POA: Diagnosis not present

## 2020-10-15 DIAGNOSIS — Z9889 Other specified postprocedural states: Secondary | ICD-10-CM | POA: Diagnosis not present

## 2020-10-15 DIAGNOSIS — M4316 Spondylolisthesis, lumbar region: Secondary | ICD-10-CM | POA: Diagnosis not present

## 2020-10-15 DIAGNOSIS — M5442 Lumbago with sciatica, left side: Secondary | ICD-10-CM | POA: Diagnosis not present

## 2020-10-15 DIAGNOSIS — M5135 Other intervertebral disc degeneration, thoracolumbar region: Secondary | ICD-10-CM | POA: Diagnosis not present

## 2020-10-15 DIAGNOSIS — M47816 Spondylosis without myelopathy or radiculopathy, lumbar region: Secondary | ICD-10-CM | POA: Diagnosis not present

## 2020-10-15 DIAGNOSIS — M5441 Lumbago with sciatica, right side: Secondary | ICD-10-CM | POA: Diagnosis not present

## 2020-10-16 DIAGNOSIS — M419 Scoliosis, unspecified: Secondary | ICD-10-CM | POA: Diagnosis not present

## 2020-10-16 DIAGNOSIS — M4807 Spinal stenosis, lumbosacral region: Secondary | ICD-10-CM | POA: Diagnosis not present

## 2020-10-16 DIAGNOSIS — M5441 Lumbago with sciatica, right side: Secondary | ICD-10-CM | POA: Diagnosis not present

## 2020-10-16 DIAGNOSIS — M4316 Spondylolisthesis, lumbar region: Secondary | ICD-10-CM | POA: Diagnosis not present

## 2020-10-16 DIAGNOSIS — M47816 Spondylosis without myelopathy or radiculopathy, lumbar region: Secondary | ICD-10-CM | POA: Diagnosis not present

## 2020-10-16 DIAGNOSIS — M5116 Intervertebral disc disorders with radiculopathy, lumbar region: Secondary | ICD-10-CM | POA: Diagnosis not present

## 2020-10-16 DIAGNOSIS — Z9889 Other specified postprocedural states: Secondary | ICD-10-CM | POA: Diagnosis not present

## 2020-10-16 DIAGNOSIS — M5442 Lumbago with sciatica, left side: Secondary | ICD-10-CM | POA: Diagnosis not present

## 2020-10-16 DIAGNOSIS — M5135 Other intervertebral disc degeneration, thoracolumbar region: Secondary | ICD-10-CM | POA: Diagnosis not present

## 2020-10-16 DIAGNOSIS — M48061 Spinal stenosis, lumbar region without neurogenic claudication: Secondary | ICD-10-CM | POA: Diagnosis not present

## 2020-10-21 DIAGNOSIS — M4804 Spinal stenosis, thoracic region: Secondary | ICD-10-CM | POA: Diagnosis not present

## 2020-10-28 ENCOUNTER — Other Ambulatory Visit: Payer: Self-pay | Admitting: Family Medicine

## 2020-10-28 DIAGNOSIS — E782 Mixed hyperlipidemia: Secondary | ICD-10-CM

## 2020-10-28 NOTE — Telephone Encounter (Signed)
Medication Refill - Medication: rosuvastatin (CRESTOR) 5 MG tablet     Preferred Pharmacy (with phone number or street name):  [id number] - [place], [place] - [address] Phone:  [phone]  Fax:  [phone]       Agent: Please be advised that RX refills may take up to 3 business days. We ask that you follow-up with your pharmacy.

## 2020-10-31 ENCOUNTER — Telehealth: Payer: Self-pay

## 2020-10-31 ENCOUNTER — Other Ambulatory Visit: Payer: Self-pay

## 2020-10-31 DIAGNOSIS — E782 Mixed hyperlipidemia: Secondary | ICD-10-CM

## 2020-10-31 MED ORDER — ROSUVASTATIN CALCIUM 5 MG PO TABS: 5.0000 mg | ORAL_TABLET | Freq: Every day | ORAL | 0 refills | Status: DC

## 2020-10-31 NOTE — Telephone Encounter (Unsigned)
Copied from [name] [id number]. Topic: General - Inquiry >> Oct 31, 2020  9:32 AM Gillis Ends D wrote: Reason for CRM: Patient would like to know why his Rosuvastatin was refused and not called in to his pharmacy. He can be reached at [phone]. Please advise

## 2020-10-31 NOTE — Telephone Encounter (Signed)
Called in RX to [facility]

## 2020-11-08 DIAGNOSIS — R270 Ataxia, unspecified: Secondary | ICD-10-CM | POA: Diagnosis not present

## 2020-11-08 DIAGNOSIS — R252 Cramp and spasm: Secondary | ICD-10-CM | POA: Diagnosis not present

## 2020-11-08 DIAGNOSIS — R2689 Other abnormalities of gait and mobility: Secondary | ICD-10-CM | POA: Diagnosis not present

## 2020-11-08 DIAGNOSIS — M545 Low back pain, unspecified: Secondary | ICD-10-CM | POA: Diagnosis not present

## 2020-11-18 DIAGNOSIS — M5440 Lumbago with sciatica, unspecified side: Secondary | ICD-10-CM | POA: Diagnosis not present

## 2020-11-18 DIAGNOSIS — M5442 Lumbago with sciatica, left side: Secondary | ICD-10-CM | POA: Diagnosis not present

## 2020-11-18 DIAGNOSIS — M5441 Lumbago with sciatica, right side: Secondary | ICD-10-CM | POA: Diagnosis not present

## 2020-11-18 DIAGNOSIS — Z9889 Other specified postprocedural states: Secondary | ICD-10-CM | POA: Diagnosis not present

## 2020-11-18 DIAGNOSIS — M4804 Spinal stenosis, thoracic region: Secondary | ICD-10-CM | POA: Diagnosis not present

## 2020-11-18 DIAGNOSIS — G8929 Other chronic pain: Secondary | ICD-10-CM | POA: Diagnosis not present

## 2020-11-22 DIAGNOSIS — M5416 Radiculopathy, lumbar region: Secondary | ICD-10-CM | POA: Diagnosis not present

## 2020-11-25 DIAGNOSIS — R296 Repeated falls: Secondary | ICD-10-CM | POA: Diagnosis not present

## 2020-11-25 DIAGNOSIS — R0789 Other chest pain: Secondary | ICD-10-CM | POA: Diagnosis not present

## 2020-11-25 DIAGNOSIS — I480 Paroxysmal atrial fibrillation: Secondary | ICD-10-CM | POA: Diagnosis not present

## 2020-11-25 DIAGNOSIS — E782 Mixed hyperlipidemia: Secondary | ICD-10-CM | POA: Diagnosis not present

## 2020-11-25 DIAGNOSIS — R079 Chest pain, unspecified: Secondary | ICD-10-CM | POA: Diagnosis not present

## 2020-12-24 DIAGNOSIS — R252 Cramp and spasm: Secondary | ICD-10-CM | POA: Diagnosis not present

## 2020-12-24 DIAGNOSIS — R200 Anesthesia of skin: Secondary | ICD-10-CM | POA: Diagnosis not present

## 2020-12-24 DIAGNOSIS — R2689 Other abnormalities of gait and mobility: Secondary | ICD-10-CM | POA: Diagnosis not present

## 2020-12-24 DIAGNOSIS — G5710 Meralgia paresthetica, unspecified lower limb: Secondary | ICD-10-CM | POA: Diagnosis not present

## 2020-12-24 DIAGNOSIS — M545 Low back pain, unspecified: Secondary | ICD-10-CM | POA: Diagnosis not present

## 2020-12-24 DIAGNOSIS — R270 Ataxia, unspecified: Secondary | ICD-10-CM | POA: Diagnosis not present

## 2020-12-24 DIAGNOSIS — G629 Polyneuropathy, unspecified: Secondary | ICD-10-CM | POA: Diagnosis not present

## 2021-01-12 DIAGNOSIS — Z23 Encounter for immunization: Secondary | ICD-10-CM | POA: Diagnosis not present

## 2021-01-24 ENCOUNTER — Other Ambulatory Visit: Payer: Self-pay | Admitting: Family Medicine

## 2021-01-24 DIAGNOSIS — E782 Mixed hyperlipidemia: Secondary | ICD-10-CM

## 2021-01-28 ENCOUNTER — Encounter: Payer: Self-pay | Admitting: Family Medicine

## 2021-01-28 ENCOUNTER — Other Ambulatory Visit: Payer: Self-pay

## 2021-01-28 ENCOUNTER — Ambulatory Visit (INDEPENDENT_AMBULATORY_CARE_PROVIDER_SITE_OTHER): Payer: Medicare Other | Admitting: Family Medicine

## 2021-01-28 VITALS — BP 138/80 | HR 60 | Ht 75.0 in | Wt 220.0 lb

## 2021-01-28 DIAGNOSIS — E782 Mixed hyperlipidemia: Secondary | ICD-10-CM

## 2021-01-28 MED ORDER — ROSUVASTATIN CALCIUM 5 MG PO TABS
ORAL_TABLET | ORAL | 1 refills | Status: DC
Start: 2021-01-28 — End: 2021-04-22

## 2021-01-28 NOTE — Progress Notes (Signed)
Date:  01/28/2021   Name:  Seth Juhnke Sr.   DOB:  Sep 17, 1941   MRN:  [id number]   Chief Complaint: Hyperlipidemia  Hyperlipidemia This is a chronic problem. The current episode started more than 1 year ago. The problem is controlled. Recent lipid tests were reviewed and are normal. He has no history of chronic renal disease, diabetes, hypothyroidism, liver disease, obesity or nephrotic syndrome. Pertinent negatives include no chest pain, focal sensory loss, focal weakness, leg pain, myalgias or shortness of breath. Current antihyperlipidemic treatment includes statins. The current treatment provides moderate improvement of lipids. There are no compliance problems.  Risk factors for coronary artery disease include hypertension and dyslipidemia.    Lab Results  Component Value Date   CREATININE 0.86 05/09/2019   BUN 15 05/09/2019   NA 134 (L) 05/09/2019   K 3.9 05/09/2019   CL 104 05/09/2019   CO2 25 05/09/2019   Lab Results  Component Value Date   CHOL 209 (H) 07/31/2020   HDL 97 07/31/2020   LDLCALC 97 07/31/2020   TRIG 89 07/31/2020   CHOLHDL 2.4 11/23/2016   No results found for: TSH No results found for: HGBA1C Lab Results  Component Value Date   WBC 8.9 05/09/2019   HGB 13.6 05/09/2019   HCT 39.3 05/09/2019   MCV 92.9 05/09/2019   PLT 190 05/09/2019   Lab Results  Component Value Date   ALT 13 11/11/2015   AST 17 11/11/2015   ALKPHOS 45 11/11/2015   BILITOT 0.8 11/11/2015     Review of Systems  Constitutional: Negative for chills and fever.  HENT: Negative for drooling, ear discharge, ear pain and sore throat.   Respiratory: Negative for cough, shortness of breath and wheezing.   Cardiovascular: Negative for chest pain, palpitations and leg swelling.  Gastrointestinal: Negative for abdominal pain, blood in stool, constipation, diarrhea and nausea.  Endocrine: Negative for polydipsia.  Genitourinary: Negative for dysuria, frequency, hematuria and  urgency.  Musculoskeletal: Negative for back pain, myalgias and neck pain.  Skin: Negative for rash.  Allergic/Immunologic: Negative for environmental allergies.  Neurological: Negative for dizziness, focal weakness and headaches.  Hematological: Does not bruise/bleed easily.  Psychiatric/Behavioral: Negative for suicidal ideas. The patient is not nervous/anxious.     Patient Active Problem List   Diagnosis Date Noted  . Right inguinal hernia 12/31/2014  . Atrial fibrillation (HCC) 05/03/2014  . HLD (hyperlipidemia) 05/03/2014  . Lumbar canal stenosis 04/12/2012    No Known Allergies  Past Surgical History:  Procedure Laterality Date  . ABLATION    . COLONOSCOPY  12/06/2014   [name]  . EYE SURGERY  2011  . HERNIA REPAIR Right 01/07/15   right inguinal repair, medium ultra Pro mesh  . LAMINECTOMY  2004    Social History   Tobacco Use  . Smoking status: Never Smoker  . Smokeless tobacco: Never Used  Vaping Use  . Vaping Use: Never used  Substance Use Topics  . Alcohol use: Yes    Alcohol/week: 2.0 standard drinks    Types: 2 Standard drinks or equivalent per week  . Drug use: No     Medication list has been reviewed and updated.  Current Meds  Medication Sig  . amiodarone (PACERONE) 200 MG tablet Take 0.5 tablets by mouth daily. Dr Lady Gary  . Ascorbic Acid (VITAMIN C) 1000 MG tablet Take 1,000 mg by mouth daily.  Marland Kitchen aspirin 81 MG tablet Take 81 mg by mouth daily.  Marland Kitchen  Flaxseed, Linseed, (FLAX SEEDS PO) Take by mouth.  . metoprolol tartrate (LOPRESSOR) 25 MG tablet Take 25 mg by mouth 2 (two) times daily. Dr Lady Gary  . Multiple Vitamins-Minerals (MULTIVITAMIN WITH MINERALS) tablet Take 1 tablet by mouth daily.  . Omega-3 1000 MG CAPS Take 1 capsule by mouth 2 (two) times daily. otc  . pregabalin (LYRICA) 50 MG capsule potter  . rosuvastatin (CRESTOR) 5 MG tablet TAKE 1 TABLET(5 MG) BY MOUTH DAILY  . vitamin E 400 UNIT capsule Take 400 Units by mouth daily.  .  [DISCONTINUED] gabapentin (NEURONTIN) 300 MG capsule Take 1 capsule by mouth at bedtime. neurology    PHQ 2/9 Scores 01/28/2021 07/31/2020 01/05/2020 05/16/2019  PHQ - 2 Score 0 0 0 0  PHQ- 9 Score 0 0 0 0    GAD 7 : Generalized Anxiety Score 01/28/2021 07/31/2020 01/05/2020  Nervous, Anxious, on Edge 0 0 0  Control/stop worrying 0 0 0  Worry too much - different things 0 0 0  Trouble relaxing 0 0 0  Restless 0 0 0  Easily annoyed or irritable 0 0 0  Afraid - awful might happen 0 0 0  Total GAD 7 Score 0 0 0    BP Readings from Last 3 Encounters:  01/28/21 138/80  07/31/20 130/70  01/05/20 120/80    Physical Exam Vitals and nursing note reviewed.  HENT:     Head: Normocephalic.     Right Ear: Tympanic membrane, ear canal and external ear normal. There is no impacted cerumen.     Left Ear: Tympanic membrane, ear canal and external ear normal. There is no impacted cerumen.     Nose: Nose normal. No congestion or rhinorrhea.     Mouth/Throat:     Mouth: Mucous membranes are moist.     Pharynx: No oropharyngeal exudate or posterior oropharyngeal erythema.  Eyes:     General: No scleral icterus.       Right eye: No discharge.        Left eye: No discharge.     Conjunctiva/sclera: Conjunctivae normal.     Pupils: Pupils are equal, round, and reactive to light.  Neck:     Thyroid: No thyromegaly.     Vascular: No JVD.     Trachea: No tracheal deviation.  Cardiovascular:     Rate and Rhythm: Normal rate and regular rhythm.     Heart sounds: Normal heart sounds. No murmur heard. No friction rub. No gallop.   Pulmonary:     Effort: No respiratory distress.     Breath sounds: Normal breath sounds. No wheezing, rhonchi or rales.  Abdominal:     General: Bowel sounds are normal. There is no distension.     Palpations: Abdomen is soft. There is no mass.     Tenderness: There is no abdominal tenderness. There is no right CVA tenderness, left CVA tenderness, guarding or rebound.      Hernia: No hernia is present.  Musculoskeletal:        General: No tenderness. Normal range of motion.     Cervical back: Normal range of motion and neck supple.  Lymphadenopathy:     Cervical: No cervical adenopathy.  Skin:    General: Skin is warm.     Findings: No rash.  Neurological:     Mental Status: He is alert and oriented to person, place, and time.     Cranial Nerves: No cranial nerve deficit.     Deep Tendon Reflexes: Reflexes  are normal and symmetric.     Wt Readings from Last 3 Encounters:  01/28/21 220 lb (99.8 kg)  07/31/20 213 lb (96.6 kg)  01/05/20 211 lb (95.7 kg)    BP 138/80   Pulse 60   Ht 6\' 3"  (1.905 m)   Wt 220 lb (99.8 kg)   BMI 27.50 kg/m   Assessment and Plan: 1. Mixed hyperlipidemia Chronic.  Controlled.  Stable.  We will continue rosuvastatin 5 mg once a day.  We will check lipid panel as done by cardiology and is acceptable.  We will recheck patient in 6 months. - rosuvastatin (CRESTOR) 5 MG tablet; TAKE 1 TABLET(5 MG) BY MOUTH DAILY  Dispense: 90 tablet; Refill: 1

## 2021-02-27 DIAGNOSIS — X32XXXA Exposure to sunlight, initial encounter: Secondary | ICD-10-CM | POA: Diagnosis not present

## 2021-02-27 DIAGNOSIS — D485 Neoplasm of uncertain behavior of skin: Secondary | ICD-10-CM | POA: Diagnosis not present

## 2021-02-27 DIAGNOSIS — D2261 Melanocytic nevi of right upper limb, including shoulder: Secondary | ICD-10-CM | POA: Diagnosis not present

## 2021-02-27 DIAGNOSIS — D0461 Carcinoma in situ of skin of right upper limb, including shoulder: Secondary | ICD-10-CM | POA: Diagnosis not present

## 2021-02-27 DIAGNOSIS — D2262 Melanocytic nevi of left upper limb, including shoulder: Secondary | ICD-10-CM | POA: Diagnosis not present

## 2021-02-27 DIAGNOSIS — L570 Actinic keratosis: Secondary | ICD-10-CM | POA: Diagnosis not present

## 2021-02-27 DIAGNOSIS — Z85828 Personal history of other malignant neoplasm of skin: Secondary | ICD-10-CM | POA: Diagnosis not present

## 2021-02-27 DIAGNOSIS — D2271 Melanocytic nevi of right lower limb, including hip: Secondary | ICD-10-CM | POA: Diagnosis not present

## 2021-03-26 DIAGNOSIS — D0461 Carcinoma in situ of skin of right upper limb, including shoulder: Secondary | ICD-10-CM | POA: Diagnosis not present

## 2021-04-17 DIAGNOSIS — H5051 Esophoria: Secondary | ICD-10-CM | POA: Diagnosis not present

## 2021-04-17 DIAGNOSIS — H35373 Puckering of macula, bilateral: Secondary | ICD-10-CM | POA: Diagnosis not present

## 2021-04-17 DIAGNOSIS — Z961 Presence of intraocular lens: Secondary | ICD-10-CM | POA: Diagnosis not present

## 2021-04-17 DIAGNOSIS — H532 Diplopia: Secondary | ICD-10-CM | POA: Diagnosis not present

## 2021-04-18 ENCOUNTER — Other Ambulatory Visit: Payer: Self-pay | Admitting: Family Medicine

## 2021-04-18 DIAGNOSIS — E782 Mixed hyperlipidemia: Secondary | ICD-10-CM

## 2021-04-18 NOTE — Telephone Encounter (Signed)
Medication Refill - Medication:  rosuvastatin (CRESTOR) 5 MG tablet   Has the patient contacted their pharmacy? No.   Preferred Pharmacy (with phone number or street name):  [id number] - [place], [place] - [address] Phone:  [phone]  Fax:  [phone]      Agent: Please be advised that RX refills may take up to 3 business days. We ask that you follow-up with your pharmacy.

## 2021-04-22 ENCOUNTER — Telehealth: Payer: Self-pay | Admitting: Family Medicine

## 2021-04-22 ENCOUNTER — Other Ambulatory Visit: Payer: Self-pay | Admitting: Family Medicine

## 2021-04-22 DIAGNOSIS — E782 Mixed hyperlipidemia: Secondary | ICD-10-CM

## 2021-04-22 NOTE — Telephone Encounter (Signed)
Copied from [place] [id number]. Topic: Medicare AWV >> Apr 22, 2021 11:03 AM Cher Nakai R wrote: Reason for CRM:  No answer unable to leave a message for patient to call back and schedule Medicare Annual Wellness Visit (AWV) in office.   If unable to come into the office for AWV,  please offer to do virtually or by telephone.  Last AWV:  06/22/2015 awvs per [place]  Please schedule at anytime with [facility] Health Advisor.  40 minute appointment  Any questions, please contact me at [phone]

## 2021-04-22 NOTE — Telephone Encounter (Signed)
Pharmacy states they did not receive 01/28/21 order. Refilled.

## 2021-05-07 ENCOUNTER — Telehealth: Payer: Self-pay

## 2021-05-07 NOTE — Telephone Encounter (Signed)
Left message for patient to return call. Can see patient next Tuesday, 05/13/21, at 8AM for hand and knuckle swelling.

## 2021-05-07 NOTE — Telephone Encounter (Signed)
Copied from [place] [phone]. Topic: General - Other >> May 07, 2021 10:05 AM Yvette Rack wrote: Reason for CRM: Pt stated he his having swelling in his hands around the knuckles and he requests call back from Dr. Zigmund Daniel' nurse to advise if this is something that Dr. Rodman Key can see him for. Pt requests call back.

## 2021-05-13 ENCOUNTER — Encounter: Payer: Self-pay | Admitting: Family Medicine

## 2021-05-13 ENCOUNTER — Inpatient Hospital Stay: Payer: Self-pay | Admitting: Radiology

## 2021-05-13 ENCOUNTER — Ambulatory Visit: Payer: Medicare Other | Admitting: Family Medicine

## 2021-05-13 ENCOUNTER — Ambulatory Visit (INDEPENDENT_AMBULATORY_CARE_PROVIDER_SITE_OTHER): Payer: Medicare Other | Admitting: Family Medicine

## 2021-05-13 ENCOUNTER — Other Ambulatory Visit: Payer: Self-pay

## 2021-05-13 VITALS — BP 122/74 | HR 56 | Temp 98.2°F | Ht 75.0 in | Wt 219.0 lb

## 2021-05-13 DIAGNOSIS — M65332 Trigger finger, left middle finger: Secondary | ICD-10-CM | POA: Insufficient documentation

## 2021-05-13 DIAGNOSIS — M65331 Trigger finger, right middle finger: Secondary | ICD-10-CM

## 2021-05-13 MED ORDER — DICLOFENAC SODIUM 2 % EX SOLN
2.0000 | Freq: Two times a day (BID) | CUTANEOUS | 0 refills | Status: DC | PRN
Start: 2021-05-13 — End: 2021-07-16

## 2021-05-13 MED ORDER — TRIAMCINOLONE ACETONIDE 40 MG/ML IJ SUSP
40.0000 mg | Freq: Once | INTRAMUSCULAR | Status: AC
  Administered 2021-05-13: 40 mg

## 2021-05-13 NOTE — Patient Instructions (Signed)
You have just been given a cortisone injection to reduce pain and inflammation. After the injection you may notice immediate relief of pain as a result of the Lidocaine. It is important to rest the area of the injection for 24 to 48 hours after the injection. There is a possibility of some temporary increased discomfort and swelling for up to 72 hours until the cortisone begins to work. If you do have pain, simply rest the joint and use ice. If you can tolerate over the counter medications, you can try Tylenol, Aleve, or Advil for added relief per package instructions.  - Start home exercises with information provided once symptoms allow - Once his skin heals at the injection sites, can use topical NSAID (diclofenac 2%) samples twice daily on an as-needed basis, can wash off after 15 minutes - Contact screening questions and follow-up as needed

## 2021-05-13 NOTE — Addendum Note (Signed)
Addended by: Forbes Cellar on: 05/13/2021 09:19 AM   Modules accepted: Orders

## 2021-05-13 NOTE — Assessment & Plan Note (Signed)
Chronic right volar third digit pain, ongoing for years, progressive worsening weeks, onset noted at the left third digit as well, associated with locking and triggering.  He has had similar symptoms in the past for his right fifth digit that responded to a cortisone injection.  Examination today reveals mild swelling throughout the entirety of the bilateral digits, range of motion shows full extension with pain and resistance during maximal flexion, focal tenderness at the distal palmar crease at the third digit bilaterally, cannot reproduce triggering, joints throughout the digits and hands are otherwise benign.  Findings are most consistent with bilateral stenosing tenosynovitis of the third digits, treatment strategies were reviewed inclusive of topical NSAID, formal rehab, local injections.  Given the timing of an upcoming international trip, he is requesting more aggressive management, he did elect to proceed with ultrasound-guided trigger finger injection of bilateral third digits.  Postinjection care reviewed also provided Rx sample of Pennsaid for him to utilize twice daily on an as-needed basis after skin injection sites have healed if pain recurs while abroad, additionally home-based rehab for him to initiate once symptoms respond to cortisone.  He can contact us for any questions and follow-up as needed.

## 2021-05-13 NOTE — Progress Notes (Signed)
Primary Care / Sports Medicine Office Visit  Patient Information:  Patient ID: Seth Zahler., male DOB: [birth year] Age: 80 y.o. MRN: [id number]   Seth Rodino Sr. is a pleasant 80 y.o. male presenting with the following:  Chief Complaint  Patient presents with   New Patient (Initial Visit)   Hand Pain    Bilateral; right hand started "locking up" about a year ago and left hand about 1-2 months ago; per patient unable to grasp or make a fist; worse pain when performing fine motor skills; hurts to grab the steering wheel or cut food with a knife; no imaging; 5/10 pain when using    Review of Systems pertinent details above   Patient Active Problem List   Diagnosis Date Noted   Trigger finger, right middle finger 05/13/2021   Trigger middle finger of left hand 05/13/2021   Leg cramps 09/07/2020   Frequent falls 04/02/2020   Low back pain 04/02/2020   Neuropathy 03/27/2020   Ataxia 01/25/2020   Imbalance 01/25/2020   Benign prostatic hyperplasia without lower urinary tract symptoms 12/13/2019   Prostate cancer screening 12/13/2019   Chest pain, atypical 06/16/2019   Right inguinal hernia 12/31/2014   Atrial fibrillation (HCC) 05/03/2014   HLD (hyperlipidemia) 05/03/2014   Lumbar canal stenosis 04/12/2012   Past Medical History:  Diagnosis Date   AF (atrial fibrillation) (HCC)    Back pain    Hemorrhoids    Hyperlipidemia    Hypertension    Outpatient Encounter Medications as of 05/13/2021  Medication Sig   amiodarone (PACERONE) 200 MG tablet Take 0.5 tablets by mouth daily. Dr Lady Gary   Ascorbic Acid (VITAMIN C) 1000 MG tablet Take 1,000 mg by mouth daily.   aspirin 81 MG EC tablet Take 81 mg by mouth daily.   Diclofenac Sodium 2 % SOLN Place 2 sprays onto the skin 2 (two) times daily as needed.   Flaxseed, Linseed, (FLAX SEEDS PO) Take by mouth.   metoprolol tartrate (LOPRESSOR) 25 MG tablet Take 25 mg by mouth 2 (two) times daily. Dr Lady Gary    Multiple Vitamins-Minerals (MULTIVITAMIN WITH MINERALS) tablet Take 1 tablet by mouth daily.   Omega-3 1000 MG CAPS Take 1 capsule by mouth 2 (two) times daily. otc   pregabalin (LYRICA) 25 MG capsule Take 50 mg by mouth in the morning and at bedtime.   rosuvastatin (CRESTOR) 5 MG tablet TAKE 1 TABLET(5 MG) BY MOUTH DAILY   tadalafil (CIALIS) 20 MG tablet Take 20 mg by mouth daily as needed.   vitamin E 400 UNIT capsule Take 400 Units by mouth daily.   [DISCONTINUED] aspirin 81 MG tablet Take 81 mg by mouth daily.   [DISCONTINUED] pregabalin (LYRICA) 50 MG capsule potter   No facility-administered encounter medications on file as of 05/13/2021.   Past Surgical History:  Procedure Laterality Date   ABLATION     COLONOSCOPY  12/06/2014   [name]   EYE SURGERY  2011   HERNIA REPAIR Right 01/07/15   right inguinal repair, medium ultra Pro mesh   LAMINECTOMY  2004    Vitals:   05/13/21 0756  BP: 122/74  Pulse: (!) 56  Temp: 98.2 F (36.8 C)  SpO2: 96%   Vitals:   05/13/21 0756  Weight: 219 lb (99.3 kg)  Height: 6\' 3"  (1.905 m)   Body mass index is 27.37 kg/m.     Independent interpretation of notes and tests performed by another provider:  None  Procedures performed:   Procedure:  Injection of volar right third digit flexor tendon at distal palmar crease under ultrasound guidance. Ultrasound guidance utilized for flexor tendon visualization, dynamic motion noted, subtle irregularity and thickening of the tendon in the vicinity of the MCP noted and targeted Samsung HS60 device utilized with permanent recording / reporting. Consent obtained and verified. Skin prepped in a sterile fashion. Ethyl chloride spray for topical local analgesia.  Completed without difficulty and tolerated well. Medication: triamcinolone acetonide 40 mg/mL suspension for injection of 0.5 mL total and 2 mL lidocaine 1% without epinephrine utilized for needle placement anesthetic Advised to contact  for fevers/chills, erythema, induration, drainage, or persistent bleeding.   Procedure:  Injection of volar left third digit flexor tendon at distal palmar crease under ultrasound guidance. Ultrasound guidance utilized for flexor tendon visualization, no significant disorganized fibers noted, subtle thickening superficial to the tendon at the MCP, this was targeted Samsung HS60 device utilized with permanent recording / reporting. Consent obtained and verified. Skin prepped in a sterile fashion. Ethyl chloride spray for topical local analgesia.  Completed without difficulty and tolerated well. Medication: triamcinolone acetonide 40 mg/mL suspension for injection 0.5 mL total and 2 mL lidocaine 1% without epinephrine utilized for needle placement anesthetic Advised to contact for fevers/chills, erythema, induration, drainage, or persistent bleeding.   Pertinent History, Exam, Impression, and Recommendations:   Trigger finger, right middle finger Chronic right volar third digit pain, ongoing for years, progressive worsening weeks, onset noted at the left third digit as well, associated with locking and triggering.  He has had similar symptoms in the past for his right fifth digit that responded to a cortisone injection.  Examination today reveals mild swelling throughout the entirety of the bilateral digits, range of motion shows full extension with pain and resistance during maximal flexion, focal tenderness at the distal palmar crease at the third digit bilaterally, cannot reproduce triggering, joints throughout the digits and hands are otherwise benign.  Findings are most consistent with bilateral stenosing tenosynovitis of the third digits, treatment strategies were reviewed inclusive of topical NSAID, formal rehab, local injections.  Given the timing of an upcoming international trip, he is requesting more aggressive management, he did elect to proceed with ultrasound-guided trigger finger  injection of bilateral third digits.  Postinjection care reviewed also provided Rx sample of Pennsaid for him to utilize twice daily on an as-needed basis after skin injection sites have healed if pain recurs while abroad, additionally home-based rehab for him to initiate once symptoms respond to cortisone.  He can contact us for any questions and follow-up as needed.    Orders & Medications Meds ordered this encounter  Medications   Diclofenac Sodium 2 % SOLN    Sig: Place 2 sprays onto the skin 2 (two) times daily as needed.    Dispense:  2 g    Refill:  0    Use Programme researcher, broadcasting/film/video for discount. Dx. Osteoarthritis    Orders Placed This Encounter  Procedures   [place] LIMITED JOINT SPACE STRUCTURES UP BILAT     Return if symptoms worsen or fail to improve.     Jerrol Banana, MD   Primary Care Sports Medicine [facility]

## 2021-06-05 ENCOUNTER — Telehealth: Payer: Self-pay

## 2021-06-05 ENCOUNTER — Telehealth (INDEPENDENT_AMBULATORY_CARE_PROVIDER_SITE_OTHER): Payer: Medicare Other | Admitting: Family Medicine

## 2021-06-05 ENCOUNTER — Encounter: Payer: Self-pay | Admitting: Family Medicine

## 2021-06-05 VITALS — Ht 75.0 in | Wt 219.0 lb

## 2021-06-05 DIAGNOSIS — J0100 Acute maxillary sinusitis, unspecified: Secondary | ICD-10-CM

## 2021-06-05 DIAGNOSIS — R059 Cough, unspecified: Secondary | ICD-10-CM

## 2021-06-05 MED ORDER — GUAIFENESIN-CODEINE 100-10 MG/5ML PO SYRP
5.0000 mL | ORAL_SOLUTION | Freq: Three times a day (TID) | ORAL | 0 refills | Status: DC | PRN
Start: 2021-06-05 — End: 2021-07-16

## 2021-06-05 MED ORDER — AZITHROMYCIN 250 MG PO TABS: ORAL_TABLET | ORAL | 0 refills | Status: AC

## 2021-06-05 NOTE — Telephone Encounter (Signed)
Patient tested positive for Covid today and has Phlem, runny nose and very tired. He would like to speak with Seth Stewart for Treatment.

## 2021-06-05 NOTE — Progress Notes (Signed)
Date:  06/05/2021   Name:  Seth Hensey Sr.   DOB:  1941-05-30   MRN:  [id number]   Chief Complaint: Sinusitis (Fatigue, runny nose, clear production, no fever/ chills. Tested positive on 9/4, but had symptoms on 05/24/21. Went on cruise and has been testing positive since then with continued sinus issues. )  I Anne Fu, MD at my office connected with this patient, Seth Stewart, by telephone at the patient's home.  I verified that I am speaking with the correct person using two identifiers. This visit was conducted via telephone due to the Covid-19 outbreak from my office at [facility] in [place], [place]. I discussed the limitations, risks, security and privacy concerns of performing an evaluation and management service by telephone. I also discussed with the patient that there may be a patient responsible charge related to this service. The patient expressed understanding and agreed to proceed.      Sinusitis This is a new problem. The current episode started in the past 7 days. The problem has been waxing and waning since onset. There has been no fever. Associated symptoms include congestion and coughing. Pertinent negatives include no chills, diaphoresis, ear pain, shortness of breath, sinus pressure, sneezing or sore throat. Past treatments include acetaminophen. The treatment provided no relief.   Lab Results  Component Value Date   CREATININE 0.86 05/09/2019   BUN 15 05/09/2019   NA 134 (L) 05/09/2019   K 3.9 05/09/2019   CL 104 05/09/2019   CO2 25 05/09/2019   Lab Results  Component Value Date   CHOL 209 (H) 07/31/2020   HDL 97 07/31/2020   LDLCALC 97 07/31/2020   TRIG 89 07/31/2020   CHOLHDL 2.4 11/23/2016   No results found for: TSH No results found for: HGBA1C Lab Results  Component Value Date   WBC 8.9 05/09/2019   HGB 13.6 05/09/2019   HCT 39.3 05/09/2019   MCV 92.9 05/09/2019   PLT 190 05/09/2019   Lab Results  Component Value Date   ALT  13 11/11/2015   AST 17 11/11/2015   ALKPHOS 45 11/11/2015   BILITOT 0.8 11/11/2015     Review of Systems  Constitutional:  Negative for chills and diaphoresis.  HENT:  Positive for congestion. Negative for ear pain, sinus pressure, sneezing and sore throat.   Respiratory:  Positive for cough. Negative for shortness of breath.    Patient Active Problem List   Diagnosis Date Noted   Trigger finger, right middle finger 05/13/2021   Trigger middle finger of left hand 05/13/2021   Leg cramps 09/07/2020   Frequent falls 04/02/2020   Low back pain 04/02/2020   Neuropathy 03/27/2020   Ataxia 01/25/2020   Imbalance 01/25/2020   Benign prostatic hyperplasia without lower urinary tract symptoms 12/13/2019   Prostate cancer screening 12/13/2019   Chest pain, atypical 06/16/2019   Right inguinal hernia 12/31/2014   Atrial fibrillation (HCC) 05/03/2014   HLD (hyperlipidemia) 05/03/2014   Lumbar canal stenosis 04/12/2012    No Known Allergies  Past Surgical History:  Procedure Laterality Date   ABLATION     COLONOSCOPY  12/06/2014   [name]   EYE SURGERY  2011   HERNIA REPAIR Right 01/07/15   right inguinal repair, medium ultra Pro mesh   LAMINECTOMY  2004    Social History   Tobacco Use   Smoking status: Never   Smokeless tobacco: Never  Vaping Use   Vaping Use: Never used  Substance  Use Topics   Alcohol use: Yes    Alcohol/week: 10.0 standard drinks    Types: 10 Standard drinks or equivalent per week   Drug use: Never     Medication list has been reviewed and updated.  Current Meds  Medication Sig   amiodarone (PACERONE) 200 MG tablet Take 0.5 tablets by mouth daily. Dr Lady Gary   Ascorbic Acid (VITAMIN C) 1000 MG tablet Take 1,000 mg by mouth daily.   aspirin 81 MG EC tablet Take 81 mg by mouth daily.   Diclofenac Sodium 2 % SOLN Place 2 sprays onto the skin 2 (two) times daily as needed.   Flaxseed, Linseed, (FLAX SEEDS PO) Take by mouth.   metoprolol tartrate  (LOPRESSOR) 25 MG tablet Take 25 mg by mouth 2 (two) times daily. Dr Lady Gary   Multiple Vitamins-Minerals (MULTIVITAMIN WITH MINERALS) tablet Take 1 tablet by mouth daily.   Omega-3 1000 MG CAPS Take 1 capsule by mouth 2 (two) times daily. otc   pregabalin (LYRICA) 50 MG capsule Take 50 mg by mouth. One in am and 2 at night   rosuvastatin (CRESTOR) 5 MG tablet TAKE 1 TABLET(5 MG) BY MOUTH DAILY   tadalafil (CIALIS) 20 MG tablet Take 20 mg by mouth daily as needed.   vitamin E 400 UNIT capsule Take 400 Units by mouth daily.    PHQ 2/9 Scores 05/13/2021 01/28/2021 07/31/2020 01/05/2020  PHQ - 2 Score 0 0 0 0  PHQ- 9 Score 0 0 0 0    GAD 7 : Generalized Anxiety Score 05/13/2021 01/28/2021 07/31/2020 01/05/2020  Nervous, Anxious, on Edge 0 0 0 0  Control/stop worrying 0 0 0 0  Worry too much - different things 0 0 0 0  Trouble relaxing 0 0 0 0  Restless 0 0 0 0  Easily annoyed or irritable 0 0 0 0  Afraid - awful might happen 0 0 0 0  Total GAD 7 Score 0 0 0 0  Anxiety Difficulty Not difficult at all - - -    BP Readings from Last 3 Encounters:  05/13/21 122/74  01/28/21 138/80  07/31/20 130/70    Physical Exam Nursing note reviewed.  Neurological:     Mental Status: He is alert.    Wt Readings from Last 3 Encounters:  06/05/21 219 lb (99.3 kg)  05/13/21 219 lb (99.3 kg)  01/28/21 220 lb (99.8 kg)    Ht 6\' 3"  (1.905 m)   Wt 219 lb (99.3 kg)   BMI 27.37 kg/m   Assessment and Plan:  1. Acute maxillary sinusitis, recurrence not specified New onset.  Status post-COVID infection.  Some of the symptomatology may be secondary to convalescence of COVID infection about 2 weeks ago.  Patient has some residual fatigue primarily otherwise no contagious symptomatology.  We will treat with a Z-Pak as directed and patient's been instructed to hydrate well and be vigilant for return of symptoms. - azithromycin (ZITHROMAX) 250 MG tablet; Take 2 tablets on day 1, then 1 tablet daily on days 2  through 5  Dispense: 6 tablet; Refill: 0  2. Cough Has occasional cough that is nonproductive.  Recovered during night and in social situations patient has been provided with Robitussin-AC to be used on an as-needed basis. - guaiFENesin-codeine (ROBITUSSIN AC) 100-10 MG/5ML syrup; Take 5 mLs by mouth 3 (three) times daily as needed for cough.  Dispense: 118 mL; Refill: 0   I spent 10 minutes with this patient, More than 50% of that  time was spent in face to face education, counseling and care coordination.

## 2021-06-10 ENCOUNTER — Ambulatory Visit (INDEPENDENT_AMBULATORY_CARE_PROVIDER_SITE_OTHER): Payer: Medicare Other

## 2021-06-10 ENCOUNTER — Other Ambulatory Visit: Payer: Self-pay

## 2021-06-10 DIAGNOSIS — I480 Paroxysmal atrial fibrillation: Secondary | ICD-10-CM | POA: Diagnosis not present

## 2021-06-10 DIAGNOSIS — E782 Mixed hyperlipidemia: Secondary | ICD-10-CM | POA: Diagnosis not present

## 2021-06-10 DIAGNOSIS — R079 Chest pain, unspecified: Secondary | ICD-10-CM | POA: Diagnosis not present

## 2021-06-10 DIAGNOSIS — R0602 Shortness of breath: Secondary | ICD-10-CM | POA: Diagnosis not present

## 2021-06-10 DIAGNOSIS — Z23 Encounter for immunization: Secondary | ICD-10-CM

## 2021-06-10 DIAGNOSIS — R0789 Other chest pain: Secondary | ICD-10-CM | POA: Diagnosis not present

## 2021-06-12 DIAGNOSIS — R0602 Shortness of breath: Secondary | ICD-10-CM | POA: Diagnosis not present

## 2021-06-16 ENCOUNTER — Telehealth: Payer: Self-pay

## 2021-06-16 NOTE — Telephone Encounter (Signed)
Called patient to schedule AWV left message for patient to call PCP office for appointment

## 2021-07-01 DIAGNOSIS — R2689 Other abnormalities of gait and mobility: Secondary | ICD-10-CM | POA: Diagnosis not present

## 2021-07-01 DIAGNOSIS — R200 Anesthesia of skin: Secondary | ICD-10-CM | POA: Diagnosis not present

## 2021-07-01 DIAGNOSIS — M545 Low back pain, unspecified: Secondary | ICD-10-CM | POA: Diagnosis not present

## 2021-07-01 DIAGNOSIS — R270 Ataxia, unspecified: Secondary | ICD-10-CM | POA: Diagnosis not present

## 2021-07-01 DIAGNOSIS — G629 Polyneuropathy, unspecified: Secondary | ICD-10-CM | POA: Diagnosis not present

## 2021-07-01 DIAGNOSIS — R252 Cramp and spasm: Secondary | ICD-10-CM | POA: Diagnosis not present

## 2021-07-16 ENCOUNTER — Encounter: Payer: Self-pay | Admitting: Family Medicine

## 2021-07-16 ENCOUNTER — Ambulatory Visit (INDEPENDENT_AMBULATORY_CARE_PROVIDER_SITE_OTHER): Payer: Medicare Other | Admitting: Family Medicine

## 2021-07-16 ENCOUNTER — Other Ambulatory Visit: Payer: Self-pay

## 2021-07-16 VITALS — BP 142/74 | HR 56 | Ht 75.0 in | Wt 216.0 lb

## 2021-07-16 DIAGNOSIS — E782 Mixed hyperlipidemia: Secondary | ICD-10-CM | POA: Diagnosis not present

## 2021-07-16 MED ORDER — ROSUVASTATIN CALCIUM 5 MG PO TABS: ORAL_TABLET | ORAL | 1 refills | Status: DC

## 2021-07-16 NOTE — Progress Notes (Signed)
Date:  07/16/2021   Name:  Seth Ostergard Sr.   DOB:  1941-04-08   MRN:  [id number]   Chief Complaint: Follow-up (6 month) and Hyperlipidemia  Hyperlipidemia This is a chronic problem. The current episode started more than 1 year ago. The problem is controlled. Recent lipid tests were reviewed and are normal. He has no history of chronic renal disease, diabetes, hypothyroidism, liver disease, obesity or nephrotic syndrome. Factors aggravating his hyperlipidemia include thiazides. Pertinent negatives include no chest pain, focal sensory loss, focal weakness, leg pain, myalgias or shortness of breath. Current antihyperlipidemic treatment includes statins. The current treatment provides moderate improvement of lipids. There are no compliance problems.  Risk factors for coronary artery disease include dyslipidemia.   Lab Results  Component Value Date   CREATININE 0.86 05/09/2019   BUN 15 05/09/2019   NA 134 (L) 05/09/2019   K 3.9 05/09/2019   CL 104 05/09/2019   CO2 25 05/09/2019   Lab Results  Component Value Date   CHOL 209 (H) 07/31/2020   HDL 97 07/31/2020   LDLCALC 97 07/31/2020   TRIG 89 07/31/2020   CHOLHDL 2.4 11/23/2016   No results found for: TSH No results found for: HGBA1C Lab Results  Component Value Date   WBC 8.9 05/09/2019   HGB 13.6 05/09/2019   HCT 39.3 05/09/2019   MCV 92.9 05/09/2019   PLT 190 05/09/2019   Lab Results  Component Value Date   ALT 13 11/11/2015   AST 17 11/11/2015   ALKPHOS 45 11/11/2015   BILITOT 0.8 11/11/2015     Review of Systems  Constitutional:  Negative for chills and fever.  HENT:  Negative for drooling, ear discharge, ear pain and sore throat.   Respiratory:  Negative for cough, shortness of breath and wheezing.   Cardiovascular:  Negative for chest pain, palpitations and leg swelling.  Gastrointestinal:  Negative for abdominal pain, blood in stool, constipation, diarrhea and nausea.  Endocrine: Negative for  polydipsia.  Genitourinary:  Negative for dysuria, frequency, hematuria and urgency.  Musculoskeletal:  Negative for back pain, myalgias and neck pain.  Skin:  Negative for rash.  Allergic/Immunologic: Negative for environmental allergies.  Neurological:  Negative for dizziness, focal weakness and headaches.  Hematological:  Does not bruise/bleed easily.  Psychiatric/Behavioral:  Negative for suicidal ideas. The patient is not nervous/anxious.    Patient Active Problem List   Diagnosis Date Noted   Trigger finger, right middle finger 05/13/2021   Trigger middle finger of left hand 05/13/2021   Leg cramps 09/07/2020   Frequent falls 04/02/2020   Low back pain 04/02/2020   Neuropathy 03/27/2020   Ataxia 01/25/2020   Imbalance 01/25/2020   Benign prostatic hyperplasia without lower urinary tract symptoms 12/13/2019   Prostate cancer screening 12/13/2019   Chest pain, atypical 06/16/2019   Right inguinal hernia 12/31/2014   Atrial fibrillation (HCC) 05/03/2014   HLD (hyperlipidemia) 05/03/2014   Lumbar canal stenosis 04/12/2012    No Known Allergies  Past Surgical History:  Procedure Laterality Date   ABLATION     COLONOSCOPY  12/06/2014   [name]   EYE SURGERY  2011   HERNIA REPAIR Right 01/07/15   right inguinal repair, medium ultra Pro mesh   LAMINECTOMY  2004    Social History   Tobacco Use   Smoking status: Never   Smokeless tobacco: Never  Vaping Use   Vaping Use: Never used  Substance Use Topics   Alcohol use: Yes  Alcohol/week: 10.0 standard drinks    Types: 10 Standard drinks or equivalent per week   Drug use: Never     Medication list has been reviewed and updated.  Current Meds  Medication Sig   amiodarone (PACERONE) 200 MG tablet Take 0.5 tablets by mouth daily. Dr Lady Gary   Ascorbic Acid (VITAMIN C) 1000 MG tablet Take 1,000 mg by mouth daily.   aspirin 81 MG EC tablet Take 81 mg by mouth daily.   Flaxseed, Linseed, (FLAX SEEDS PO) Take 2 Scoops by  mouth daily.   metoprolol tartrate (LOPRESSOR) 25 MG tablet Take 25 mg by mouth 2 (two) times daily. Dr Lady Gary   Multiple Vitamins-Minerals (MULTIVITAMIN WITH MINERALS) tablet Take 1 tablet by mouth daily.   Omega-3 1000 MG CAPS Take 1 capsule by mouth 2 (two) times daily. otc   pregabalin (LYRICA) 50 MG capsule Take 50 mg by mouth. One in am and 2 at night   rosuvastatin (CRESTOR) 5 MG tablet TAKE 1 TABLET(5 MG) BY MOUTH DAILY   tadalafil (CIALIS) 20 MG tablet Take 20 mg by mouth daily as needed.   vitamin E 400 UNIT capsule Take 400 Units by mouth daily.    PHQ 2/9 Scores 07/16/2021 05/13/2021 01/28/2021 07/31/2020  PHQ - 2 Score 0 0 0 0  PHQ- 9 Score 0 0 0 0    GAD 7 : Generalized Anxiety Score 07/16/2021 05/13/2021 01/28/2021 07/31/2020  Nervous, Anxious, on Edge 0 0 0 0  Control/stop worrying 0 0 0 0  Worry too much - different things 0 0 0 0  Trouble relaxing 0 0 0 0  Restless 0 0 0 0  Easily annoyed or irritable 0 0 0 0  Afraid - awful might happen 0 0 0 0  Total GAD 7 Score 0 0 0 0  Anxiety Difficulty - Not difficult at all - -    BP Readings from Last 3 Encounters:  07/16/21 (!) 142/74  05/13/21 122/74  01/28/21 138/80    Physical Exam Vitals and nursing note reviewed.  HENT:     Head: Normocephalic.     Right Ear: External ear normal.     Left Ear: External ear normal.     Nose: Nose normal.  Eyes:     General: No scleral icterus.       Right eye: No discharge.        Left eye: No discharge.     Conjunctiva/sclera: Conjunctivae normal.     Pupils: Pupils are equal, round, and reactive to light.  Neck:     Thyroid: No thyromegaly.     Vascular: No JVD.     Trachea: No tracheal deviation.  Cardiovascular:     Rate and Rhythm: Normal rate and regular rhythm.     Heart sounds: Normal heart sounds. No murmur heard.   No friction rub. No gallop.  Pulmonary:     Effort: No respiratory distress.     Breath sounds: Normal breath sounds. No wheezing or rales.   Abdominal:     General: Bowel sounds are normal.     Palpations: Abdomen is soft. There is no mass.     Tenderness: There is no abdominal tenderness. There is no guarding or rebound.  Musculoskeletal:        General: No tenderness. Normal range of motion.     Cervical back: Normal range of motion and neck supple.  Lymphadenopathy:     Cervical: No cervical adenopathy.  Skin:    General:  Skin is warm.     Findings: No rash.  Neurological:     Mental Status: He is alert and oriented to person, place, and time.     Cranial Nerves: No cranial nerve deficit.    Wt Readings from Last 3 Encounters:  07/16/21 216 lb (98 kg)  06/05/21 219 lb (99.3 kg)  05/13/21 219 lb (99.3 kg)    BP (!) 142/74   Pulse (!) 56   Ht 6\' 3"  (1.905 m)   Wt 216 lb (98 kg)   BMI 27.00 kg/m   Assessment and Plan:  1. Mixed hyperlipidemia Chronic.  Controlled.  Stable.  Review of last lipid was significantly improved with only a mild elevation of HDL at 72.  Therefore we will continue the Crestor 5 mg once a day and will recheck patient in 6 months. - rosuvastatin (CRESTOR) 5 MG tablet; Take 1 tablet by mouth daily  Dispense: 90 tablet; Refill: 1

## 2021-08-07 DIAGNOSIS — Z23 Encounter for immunization: Secondary | ICD-10-CM | POA: Diagnosis not present

## 2021-10-27 ENCOUNTER — Ambulatory Visit (INDEPENDENT_AMBULATORY_CARE_PROVIDER_SITE_OTHER): Payer: Medicare Other

## 2021-10-27 DIAGNOSIS — Z0000 Encounter for general adult medical examination without abnormal findings: Secondary | ICD-10-CM

## 2021-10-27 NOTE — Progress Notes (Signed)
Subjective:   Milferd Ansell Sr. is a 81 y.o. male who presents for Medicare Annual/Subsequent preventive examination.  Virtual Visit via Telephone Note  I connected with  Zyree Traynham Sr. on 10/27/21 at  8:00 AM EST by telephone and verified that I am speaking with the correct person using two identifiers.  Location: Patient: home Provider: [facility] Persons participating in the virtual visit: [place]   I discussed the limitations, risks, security and privacy concerns of performing an evaluation and management service by telephone and the availability of in person appointments. The patient expressed understanding and agreed to proceed.  Interactive audio and video telecommunications were attempted between this nurse and patient, however failed, due to patient having technical difficulties OR patient did not have access to video capability.  We continued and completed visit with audio only.  Some vital signs may be absent or patient reported.   Clemetine Marker, LPN   Review of Systems     Cardiac Risk Factors include: advanced age (>79men, >66 women);male gender;dyslipidemia;hypertension     Objective:    There were no vitals filed for this visit. There is no height or weight on file to calculate BMI.  Advanced Directives 10/27/2021 11/11/2019 05/09/2019  Does Patient Have a Medical Advance Directive? Yes Yes Yes  Type of Paramedic of [place];Living will [place];Living will [place];Living will  Copy of [place] in Chart? No - copy requested - No - copy requested    Current Medications (verified) Outpatient Encounter Medications as of 10/27/2021  Medication Sig   amiodarone (PACERONE) 200 MG tablet Take 0.5 tablets by mouth daily. Dr Ubaldo Glassing   Ascorbic Acid (VITAMIN C) 1000 MG tablet Take 1,000 mg by mouth daily.   aspirin 81 MG EC tablet Take 81 mg by mouth daily.    Flaxseed, Linseed, (FLAX SEEDS PO) Take 2 Scoops by mouth daily.   metoprolol tartrate (LOPRESSOR) 25 MG tablet Take 25 mg by mouth 2 (two) times daily. Dr Ubaldo Glassing   Multiple Vitamins-Minerals (MULTIVITAMIN WITH MINERALS) tablet Take 1 tablet by mouth daily.   Omega-3 1000 MG CAPS Take 1 capsule by mouth 2 (two) times daily. otc   pregabalin (LYRICA) 50 MG capsule Take 50 mg by mouth. One in am and 2 at night   rosuvastatin (CRESTOR) 5 MG tablet Take 1 tablet by mouth daily   tadalafil (CIALIS) 20 MG tablet Take 20 mg by mouth daily as needed.   vitamin E 400 UNIT capsule Take 400 Units by mouth daily.   No facility-administered encounter medications on file as of 10/27/2021.    Allergies (verified) Patient has no known allergies.   History: Past Medical History:  Diagnosis Date   AF (atrial fibrillation) (HCC)    Back pain    Hemorrhoids    Hyperlipidemia    Hypertension    Past Surgical History:  Procedure Laterality Date   ABLATION     COLONOSCOPY  12/06/2014   Dr. Candace Cruise   EYE SURGERY  2011   HERNIA REPAIR Right 01/07/15   right inguinal repair, medium ultra Pro mesh   LAMINECTOMY  2004   Family History  Problem Relation Age of Onset   Cancer Mother    Heart disease Father    Social History   Socioeconomic History   Marital status: Married    Spouse name: Porfirio Bollier   Number of children: 1   Years of education: 39  Highest education level: Bachelor's degree (e.g., BA, AB, BS)  Occupational History   Not on file  Tobacco Use   Smoking status: Never   Smokeless tobacco: Never  Vaping Use   Vaping Use: Never used  Substance and Sexual Activity   Alcohol use: Yes    Alcohol/week: 10.0 standard drinks    Types: 10 Standard drinks or equivalent per week   Drug use: Never   Sexual activity: Yes    Partners: Female  Other Topics Concern   Not on file  Social History Narrative   Not on file   Social Determinants of Health   Financial Resource Strain: Low Risk     Difficulty of Paying Living Expenses: Not hard at all  Food Insecurity: No Food Insecurity   Worried About Charity fundraiser in the Last Year: Never true   [place] in the Last Year: Never true  Transportation Needs: No Transportation Needs   Lack of Transportation (Medical): No   Lack of Transportation (Non-Medical): No  Physical Activity: Insufficiently Active   Days of Exercise per Week: 3 days   Minutes of Exercise per Session: 30 min  Stress: No Stress Concern Present   Feeling of Stress : Not at all  Social Connections: Moderately Isolated   Frequency of Communication with Friends and Family: More than three times a week   Frequency of Social Gatherings with Friends and Family: Three times a week   Attends Religious Services: Never   Active Member of Clubs or Organizations: No   Attends Music therapist: Never   Marital Status: Married    Tobacco Counseling Counseling given: Not Answered   Clinical Intake:  Pre-visit preparation completed: Yes  Pain : No/denies pain     Nutritional Risks: None Diabetes: No  How often do you need to have someone help you when you read instructions, pamphlets, or other written materials from your doctor or pharmacy?: 1 - Never    Interpreter Needed?: No  Information entered by :: Clemetine Marker LPN   Activities of Daily Living In your present state of health, do you have any difficulty performing the following activities: 10/27/2021 10/24/2021  Hearing? Tempie Donning  Comment wears hearing aids -  Vision? N N  Difficulty concentrating or making decisions? N N  Walking or climbing stairs? N N  Dressing or bathing? N N  Doing errands, shopping? N N  Preparing Food and eating ? N N  Using the Toilet? N N  In the past six months, have you accidently leaked urine? N N  Do you have problems with loss of bowel control? N N  Managing your Medications? N N  Managing your Finances? N N  Housekeeping or managing your  Housekeeping? N N  Some recent data might be hidden    Patient Care Team: Juline Patch, MD as PCP - General (Family Medicine) Ubaldo Glassing Javier Docker, MD as Consulting Physician (Cardiology)  Indicate any recent Medical Services you may have received from other than [facility] providers in the past year (date may be approximate).     Assessment:   This is a routine wellness examination for Elih.  Hearing/Vision screen Hearing Screening - Comments:: Pt wears hearing aids maintained by [facility] Vision Screening - Comments:: Annual vision screenings done by Dr. Ellin Mayhew and Dr. Zigmund Daniel in [place]  Dietary issues and exercise activities discussed: Current Exercise Habits: Home exercise routine, Type of exercise: walking;stretching;calisthenics, Time (Minutes): 30, Frequency (Times/Week): 3, Weekly  Exercise (Minutes/Week): 90, Intensity: Moderate, Exercise limited by: None identified   Goals Addressed   None    Depression Screen PHQ 2/9 Scores 10/27/2021 07/16/2021 05/13/2021 01/28/2021 07/31/2020 01/05/2020 05/16/2019  PHQ - 2 Score 0 0 0 0 0 0 0  PHQ- 9 Score - 0 0 0 0 0 0    Fall Risk Fall Risk  10/27/2021 10/24/2021 05/13/2021 07/31/2020 01/05/2020  Falls in the past year? 0 0 0 0 1  Number falls in past yr: 0 - 0 - 1  Injury with Fall? 0 - 0 - 1  Comment - - - - knee hurts  Risk for fall due to : No Fall Risks - No Fall Risks - -  Follow up Falls prevention discussed - Falls evaluation completed Falls evaluation completed Falls evaluation completed    [place]:  Any stairs in or around the home? No  If so, are there any without handrails? No  Home free of loose throw rugs in walkways, pet beds, electrical cords, etc? Yes  Adequate lighting in your home to reduce risk of falls? Yes   ASSISTIVE DEVICES UTILIZED TO PREVENT FALLS:  Life alert? No  Use of a cane, walker or w/c? No  Grab bars in the bathroom? Yes  Shower chair or bench in shower? Yes   Elevated toilet seat or a handicapped toilet? No   TIMED UP AND GO:  Was the test performed? No . Telephonic visit.   Cognitive Function: Normal cognitive status assessed by direct observation by this Nurse Health Advisor. No abnormalities found.          Immunizations Immunization History  Administered Date(s) Administered   Fluad Quad(high Dose 65+) 05/16/2019, 06/10/2021   Influenza, High Dose Seasonal PF 06/21/2017   Influenza-Unspecified 06/16/2016, 06/21/2017, 05/25/2020   PFIZER(Purple Top)SARS-COV-2 Vaccination 09/25/2019, 10/16/2019, 05/25/2020, 01/02/2021   Pfizer Covid-19 Vaccine Bivalent Booster 67yrs & up 08/07/2021   Pneumococcal Conjugate-13 09/21/2013   Pneumococcal Polysaccharide-23 06/27/2015   Tdap 11/11/2015   Zoster Recombinat (Shingrix) 06/13/2018, 08/12/2018   Zoster, Live 09/22/2007    TDAP status: Up to date  Flu Vaccine status: Up to date  Pneumococcal vaccine status: Up to date  Covid-19 vaccine status: Completed vaccines  Qualifies for Shingles Vaccine? Yes   Zostavax completed Yes   Shingrix Completed?: Yes  Screening Tests Health Maintenance  Topic Date Due   TETANUS/TDAP  11/10/2025   Pneumonia Vaccine 59+ Years old  Completed   INFLUENZA VACCINE  Completed   COVID-19 Vaccine  Completed   Zoster Vaccines- Shingrix  Completed   HPV VACCINES  Aged Out    Health Maintenance  There are no preventive care reminders to display for this patient.   Colorectal cancer screening: No longer required.   Lung Cancer Screening: (Low Dose CT Chest recommended if Age 103-80 years, 30 pack-year currently smoking OR have quit w/in 15years.) does not qualify.   Additional Screening:  Hepatitis C Screening: does not qualify  Vision Screening: Recommended annual ophthalmology exams for early detection of glaucoma and other disorders of the eye. Is the patient up to date with their annual eye exam?  Yes  Who is the provider or what is the name  of the office in which the patient attends annual eye exams? Dr. Ellin Mayhew & Dr. Zigmund Daniel.   Dental Screening: Recommended annual dental exams for proper oral hygiene  Community Resource Referral / Chronic Care Management: CRR required this visit?  No   CCM  required this visit?  No      Plan:     I have personally reviewed and noted the following in the patients chart:   Medical and social history Use of alcohol, tobacco or illicit drugs  Current medications and supplements including opioid prescriptions. Patient is not currently taking opioid prescriptions. Functional ability and status Nutritional status Physical activity Advanced directives List of other physicians Hospitalizations, surgeries, and ER visits in previous 12 months Vitals Screenings to include cognitive, depression, and falls Referrals and appointments  In addition, I have reviewed and discussed with patient certain preventive protocols, quality metrics, and best practice recommendations. A written personalized care plan for preventive services as well as general preventive health recommendations were provided to patient.     Clemetine Marker, LPN   01/22/6269   Nurse Notes: pt inquired about boosters for typhoid and yellow fever vaccines due to traveling out of the country and last vaccines for those in 2014. Advised to contact health department for best resource.

## 2021-10-27 NOTE — Patient Instructions (Signed)
Seth Stewart , Thank you for taking time to come for your Medicare Wellness Visit. I appreciate your ongoing commitment to your health goals. Please review the following plan we discussed and let me know if I can assist you in the future.   Screening recommendations/referrals: Colonoscopy: no longer required Recommended yearly ophthalmology/optometry visit for glaucoma screening and checkup Recommended yearly dental visit for hygiene and checkup  Vaccinations: Influenza vaccine: done 06/10/21 Pneumococcal vaccine: done 06/27/15 Tdap vaccine: done 11/11/15 Shingles vaccine: done 06/13/18 & 08/12/18   Covid-19: done 09/25/19, 10/16/19, 05/25/20, 01/02/21 & 08/07/21  Advanced directives: Please bring a copy of your health care power of attorney and living will to the office at your convenience.   Conditions/risks identified: Keep up the great work!  Next appointment: Follow up in one year for your annual wellness visit.   Preventive Care 38 Years and Older, Male Preventive care refers to lifestyle choices and visits with your health care provider that can promote health and wellness. What does preventive care include? A yearly physical exam. This is also called an annual well check. Dental exams once or twice a year. Routine eye exams. Ask your health care provider how often you should have your eyes checked. Personal lifestyle choices, including: Daily care of your teeth and gums. Regular physical activity. Eating a healthy diet. Avoiding tobacco and drug use. Limiting alcohol use. Practicing safe sex. Taking low doses of aspirin every day. Taking vitamin and mineral supplements as recommended by your health care provider. What happens during an annual well check? The services and screenings done by your health care provider during your annual well check will depend on your age, overall health, lifestyle risk factors, and family history of disease. Counseling  Your health care provider may  ask you questions about your: Alcohol use. Tobacco use. Drug use. Emotional well-being. Home and relationship well-being. Sexual activity. Eating habits. History of falls. Memory and ability to understand (cognition). Work and work Statistician. Screening  You may have the following tests or measurements: Height, weight, and BMI. Blood pressure. Lipid and cholesterol levels. These may be checked every 5 years, or more frequently if you are over 22 years old. Skin check. Lung cancer screening. You may have this screening every year starting at age 39 if you have a 30-pack-year history of smoking and currently smoke or have quit within the past 15 years. Fecal occult blood test (FOBT) of the stool. You may have this test every year starting at age 45. Flexible sigmoidoscopy or colonoscopy. You may have a sigmoidoscopy every 5 years or a colonoscopy every 10 years starting at age 21. Prostate cancer screening. Recommendations will vary depending on your family history and other risks. Hepatitis C blood test. Hepatitis B blood test. Sexually transmitted disease (STD) testing. Diabetes screening. This is done by checking your blood sugar (glucose) after you have not eaten for a while (fasting). You may have this done every 1-3 years. Abdominal aortic aneurysm (AAA) screening. You may need this if you are a current or former smoker. Osteoporosis. You may be screened starting at age 75 if you are at high risk. Talk with your health care provider about your test results, treatment options, and if necessary, the need for more tests. Vaccines  Your health care provider may recommend certain vaccines, such as: Influenza vaccine. This is recommended every year. Tetanus, diphtheria, and acellular pertussis (Tdap, Td) vaccine. You may need a Td booster every 10 years. Zoster vaccine. You may need this  after age 74. Pneumococcal 13-valent conjugate (PCV13) vaccine. One dose is recommended after age  60. Pneumococcal polysaccharide (PPSV23) vaccine. One dose is recommended after age 9. Talk to your health care provider about which screenings and vaccines you need and how often you need them. This information is not intended to replace advice given to you by your health care provider. Make sure you discuss any questions you have with your health care provider. Document Released: 10/04/2015 Document Revised: 05/27/2016 Document Reviewed: 07/09/2015 Elsevier Interactive Patient Education  2017 [place] Prevention in the Home Falls can cause injuries. They can happen to people of all ages. There are many things you can do to make your home safe and to help prevent falls. What can I do on the outside of my home? Regularly fix the edges of walkways and driveways and fix any cracks. Remove anything that might make you trip as you walk through a door, such as a raised step or threshold. Trim any bushes or trees on the path to your home. Use bright outdoor lighting. Clear any walking paths of anything that might make someone trip, such as rocks or tools. Regularly check to see if handrails are loose or broken. Make sure that both sides of any steps have handrails. Any raised decks and porches should have guardrails on the edges. Have any leaves, snow, or ice cleared regularly. Use sand or salt on walking paths during winter. Clean up any spills in your garage right away. This includes oil or grease spills. What can I do in the bathroom? Use night lights. Install grab bars by the toilet and in the tub and shower. Do not use towel bars as grab bars. Use non-skid mats or decals in the tub or shower. If you need to sit down in the shower, use a plastic, non-slip stool. Keep the floor dry. Clean up any water that spills on the floor as soon as it happens. Remove soap buildup in the tub or shower regularly. Attach bath mats securely with double-sided non-slip rug tape. Do not have throw  rugs and other things on the floor that can make you trip. What can I do in the bedroom? Use night lights. Make sure that you have a light by your bed that is easy to reach. Do not use any sheets or blankets that are too big for your bed. They should not hang down onto the floor. Have a firm chair that has side arms. You can use this for support while you get dressed. Do not have throw rugs and other things on the floor that can make you trip. What can I do in the kitchen? Clean up any spills right away. Avoid walking on wet floors. Keep items that you use a lot in easy-to-reach places. If you need to reach something above you, use a strong step stool that has a grab bar. Keep electrical cords out of the way. Do not use floor polish or wax that makes floors slippery. If you must use wax, use non-skid floor wax. Do not have throw rugs and other things on the floor that can make you trip. What can I do with my stairs? Do not leave any items on the stairs. Make sure that there are handrails on both sides of the stairs and use them. Fix handrails that are broken or loose. Make sure that handrails are as long as the stairways. Check any carpeting to make sure that it is firmly attached to the  stairs. Fix any carpet that is loose or worn. Avoid having throw rugs at the top or bottom of the stairs. If you do have throw rugs, attach them to the floor with carpet tape. Make sure that you have a light switch at the top of the stairs and the bottom of the stairs. If you do not have them, ask someone to add them for you. What else can I do to help prevent falls? Wear shoes that: Do not have high heels. Have rubber bottoms. Are comfortable and fit you well. Are closed at the toe. Do not wear sandals. If you use a stepladder: Make sure that it is fully opened. Do not climb a closed stepladder. Make sure that both sides of the stepladder are locked into place. Ask someone to hold it for you, if  possible. Clearly mark and make sure that you can see: Any grab bars or handrails. First and last steps. Where the edge of each step is. Use tools that help you move around (mobility aids) if they are needed. These include: Canes. Walkers. Scooters. Crutches. Turn on the lights when you go into a dark area. Replace any light bulbs as soon as they burn out. Set up your furniture so you have a clear path. Avoid moving your furniture around. If any of your floors are uneven, fix them. If there are any pets around you, be aware of where they are. Review your medicines with your doctor. Some medicines can make you feel dizzy. This can increase your chance of falling. Ask your doctor what other things that you can do to help prevent falls. This information is not intended to replace advice given to you by your health care provider. Make sure you discuss any questions you have with your health care provider. Document Released: 07/04/2009 Document Revised: 02/13/2016 Document Reviewed: 10/12/2014 Elsevier Interactive Patient Education  2017 [employer].

## 2021-10-30 DIAGNOSIS — L538 Other specified erythematous conditions: Secondary | ICD-10-CM | POA: Diagnosis not present

## 2021-10-30 DIAGNOSIS — D2272 Melanocytic nevi of left lower limb, including hip: Secondary | ICD-10-CM | POA: Diagnosis not present

## 2021-10-30 DIAGNOSIS — D2261 Melanocytic nevi of right upper limb, including shoulder: Secondary | ICD-10-CM | POA: Diagnosis not present

## 2021-10-30 DIAGNOSIS — L570 Actinic keratosis: Secondary | ICD-10-CM | POA: Diagnosis not present

## 2021-10-30 DIAGNOSIS — D2271 Melanocytic nevi of right lower limb, including hip: Secondary | ICD-10-CM | POA: Diagnosis not present

## 2021-10-30 DIAGNOSIS — D0359 Melanoma in situ of other part of trunk: Secondary | ICD-10-CM | POA: Diagnosis not present

## 2021-10-30 DIAGNOSIS — D2262 Melanocytic nevi of left upper limb, including shoulder: Secondary | ICD-10-CM | POA: Diagnosis not present

## 2021-10-30 DIAGNOSIS — D0410 Carcinoma in situ of skin of unspecified eyelid, including canthus: Secondary | ICD-10-CM | POA: Diagnosis not present

## 2021-10-30 DIAGNOSIS — L820 Inflamed seborrheic keratosis: Secondary | ICD-10-CM | POA: Diagnosis not present

## 2021-10-30 DIAGNOSIS — D485 Neoplasm of uncertain behavior of skin: Secondary | ICD-10-CM | POA: Diagnosis not present

## 2021-10-30 DIAGNOSIS — D225 Melanocytic nevi of trunk: Secondary | ICD-10-CM | POA: Diagnosis not present

## 2021-10-30 DIAGNOSIS — X32XXXA Exposure to sunlight, initial encounter: Secondary | ICD-10-CM | POA: Diagnosis not present

## 2021-10-30 DIAGNOSIS — L821 Other seborrheic keratosis: Secondary | ICD-10-CM | POA: Diagnosis not present

## 2021-11-05 DIAGNOSIS — D169 Benign neoplasm of bone and articular cartilage, unspecified: Secondary | ICD-10-CM | POA: Insufficient documentation

## 2021-11-19 DIAGNOSIS — D0359 Melanoma in situ of other part of trunk: Secondary | ICD-10-CM | POA: Diagnosis not present

## 2021-12-03 DIAGNOSIS — D04112 Carcinoma in situ of skin of right lower eyelid, including canthus: Secondary | ICD-10-CM | POA: Diagnosis not present

## 2021-12-03 DIAGNOSIS — D0410 Carcinoma in situ of skin of unspecified eyelid, including canthus: Secondary | ICD-10-CM | POA: Diagnosis not present

## 2021-12-17 DIAGNOSIS — I480 Paroxysmal atrial fibrillation: Secondary | ICD-10-CM | POA: Diagnosis not present

## 2021-12-17 DIAGNOSIS — R9430 Abnormal result of cardiovascular function study, unspecified: Secondary | ICD-10-CM | POA: Diagnosis not present

## 2021-12-17 DIAGNOSIS — R0789 Other chest pain: Secondary | ICD-10-CM | POA: Diagnosis not present

## 2021-12-17 DIAGNOSIS — R079 Chest pain, unspecified: Secondary | ICD-10-CM | POA: Diagnosis not present

## 2021-12-17 DIAGNOSIS — E782 Mixed hyperlipidemia: Secondary | ICD-10-CM | POA: Diagnosis not present

## 2021-12-31 ENCOUNTER — Other Ambulatory Visit: Payer: Self-pay | Admitting: Cardiology

## 2021-12-31 ENCOUNTER — Telehealth (HOSPITAL_COMMUNITY): Payer: Self-pay | Admitting: Emergency Medicine

## 2021-12-31 DIAGNOSIS — R9430 Abnormal result of cardiovascular function study, unspecified: Secondary | ICD-10-CM

## 2021-12-31 DIAGNOSIS — R079 Chest pain, unspecified: Secondary | ICD-10-CM

## 2021-12-31 NOTE — Telephone Encounter (Signed)
Reaching out to patient to offer assistance regarding upcoming cardiac imaging study; pt verbalizes understanding of appt date/time, parking situation and where to check in, pre-test NPO status and medications ordered, and verified current allergies; name and call back number provided for further questions should they arise ?Marchia Bond RN Navigator Cardiac Imaging ?[facility] ?[phone] office ?[phone] cell ? ?Taking daily dose metop 2 hr prior to scan ?Holding cialis ?Arrival 745  ?Denies iv issues ? ?

## 2022-01-01 ENCOUNTER — Ambulatory Visit
Admission: RE | Admit: 2022-01-01 | Discharge: 2022-01-01 | Disposition: A | Discharge status: Home / Self Care | Payer: Medicare Other | Source: Ambulatory Visit | Attending: Cardiology | Admitting: Cardiology

## 2022-01-01 ENCOUNTER — Other Ambulatory Visit: Payer: Self-pay | Admitting: Cardiology

## 2022-01-01 DIAGNOSIS — R079 Chest pain, unspecified: Secondary | ICD-10-CM | POA: Diagnosis not present

## 2022-01-01 DIAGNOSIS — G5710 Meralgia paresthetica, unspecified lower limb: Secondary | ICD-10-CM | POA: Diagnosis not present

## 2022-01-01 DIAGNOSIS — R931 Abnormal findings on diagnostic imaging of heart and coronary circulation: Secondary | ICD-10-CM

## 2022-01-01 DIAGNOSIS — R9430 Abnormal result of cardiovascular function study, unspecified: Secondary | ICD-10-CM | POA: Insufficient documentation

## 2022-01-01 DIAGNOSIS — G629 Polyneuropathy, unspecified: Secondary | ICD-10-CM | POA: Diagnosis not present

## 2022-01-01 DIAGNOSIS — R2689 Other abnormalities of gait and mobility: Secondary | ICD-10-CM | POA: Diagnosis not present

## 2022-01-01 DIAGNOSIS — R252 Cramp and spasm: Secondary | ICD-10-CM | POA: Diagnosis not present

## 2022-01-01 DIAGNOSIS — I2510 Atherosclerotic heart disease of native coronary artery without angina pectoris: Secondary | ICD-10-CM | POA: Diagnosis not present

## 2022-01-01 DIAGNOSIS — M545 Low back pain, unspecified: Secondary | ICD-10-CM | POA: Diagnosis not present

## 2022-01-01 DIAGNOSIS — R270 Ataxia, unspecified: Secondary | ICD-10-CM | POA: Diagnosis not present

## 2022-01-01 LAB — POCT I-STAT CREATININE: Creatinine, Ser: 1.1 mg/dL (ref 0.61–1.24)

## 2022-01-01 MED ORDER — IOHEXOL 350 MG/ML SOLN
75.0000 mL | Freq: Once | INTRAVENOUS | Status: AC | PRN
  Administered 2022-01-01: 75 mL via INTRAVENOUS

## 2022-01-01 MED ORDER — NITROGLYCERIN 0.4 MG SL SUBL
0.8000 mg | SUBLINGUAL_TABLET | Freq: Once | SUBLINGUAL | Status: AC
  Administered 2022-01-01: 0.8 mg via SUBLINGUAL

## 2022-01-01 NOTE — Progress Notes (Signed)
Patient tolerated CT well. Drank water after. Vital signs stable encourage to drink water throughout day.Reasons explained and verbalized understanding. Ambulated steady gait.  

## 2022-01-02 DIAGNOSIS — R931 Abnormal findings on diagnostic imaging of heart and coronary circulation: Secondary | ICD-10-CM | POA: Diagnosis not present

## 2022-01-15 ENCOUNTER — Other Ambulatory Visit: Payer: Self-pay | Admitting: Family Medicine

## 2022-01-15 DIAGNOSIS — E782 Mixed hyperlipidemia: Secondary | ICD-10-CM

## 2022-01-22 ENCOUNTER — Encounter: Payer: Self-pay | Admitting: Family Medicine

## 2022-01-22 ENCOUNTER — Ambulatory Visit (INDEPENDENT_AMBULATORY_CARE_PROVIDER_SITE_OTHER): Payer: Medicare Other | Admitting: Family Medicine

## 2022-01-22 VITALS — BP 134/80 | HR 60 | Ht 75.0 in | Wt 215.0 lb

## 2022-01-22 DIAGNOSIS — E782 Mixed hyperlipidemia: Secondary | ICD-10-CM

## 2022-01-22 MED ORDER — ROSUVASTATIN CALCIUM 5 MG PO TABS: 5.0000 mg | ORAL_TABLET | Freq: Every day | ORAL | 1 refills | Status: DC

## 2022-01-22 NOTE — Progress Notes (Signed)
? ? ?Date:  01/22/2022  ? ?Name:  Seth Lisiecki Sr.   DOB:  1941/04/16   MRN:  [id number] ? ? ?Chief Complaint: Hyperlipidemia ? ?Hyperlipidemia ?This is a chronic problem. The current episode started more than 1 year ago. The problem is controlled. Recent lipid tests were reviewed and are normal. He has no history of chronic renal disease, diabetes, hypothyroidism, liver disease, obesity or nephrotic syndrome. There are no known factors aggravating his hyperlipidemia. Pertinent negatives include no chest pain, myalgias or shortness of breath. Current antihyperlipidemic treatment includes statins. The current treatment provides moderate improvement of lipids. There are no compliance problems.  Risk factors for coronary artery disease include hypertension and dyslipidemia.  ? ?Lab Results  ?Component Value Date  ? NA 134 (L) 05/09/2019  ? K 3.9 05/09/2019  ? CO2 25 05/09/2019  ? GLUCOSE 173 (H) 05/09/2019  ? BUN 15 05/09/2019  ? CREATININE 1.10 01/01/2022  ? CALCIUM 9.5 05/09/2019  ? GFRNONAA >60 05/09/2019  ? ?Lab Results  ?Component Value Date  ? CHOL 209 (H) 07/31/2020  ? HDL 97 07/31/2020  ? LDLCALC 97 07/31/2020  ? TRIG 89 07/31/2020  ? CHOLHDL 2.4 11/23/2016  ? ?No results found for: TSH ?No results found for: HGBA1C ?Lab Results  ?Component Value Date  ? WBC 8.9 05/09/2019  ? HGB 13.6 05/09/2019  ? HCT 39.3 05/09/2019  ? MCV 92.9 05/09/2019  ? PLT 190 05/09/2019  ? ?Lab Results  ?Component Value Date  ? ALT 13 11/11/2015  ? AST 17 11/11/2015  ? ALKPHOS 45 11/11/2015  ? BILITOT 0.8 11/11/2015  ? ?No results found for: 25OHVITD2, 25OHVITD3, VD25OH  ? ?Review of Systems  ?Constitutional:  Negative for chills and fever.  ?HENT:  Negative for drooling, ear discharge, ear pain and sore throat.   ?Respiratory:  Negative for cough, shortness of breath and wheezing.   ?Cardiovascular:  Negative for chest pain, palpitations and leg swelling.  ?Gastrointestinal:  Negative for abdominal pain, blood in stool,  constipation, diarrhea and nausea.  ?Endocrine: Negative for polydipsia.  ?Genitourinary:  Negative for dysuria, frequency, hematuria and urgency.  ?Musculoskeletal:  Negative for back pain, myalgias and neck pain.  ?Skin:  Negative for rash.  ?Allergic/Immunologic: Negative for environmental allergies.  ?Neurological:  Negative for dizziness and headaches.  ?Hematological:  Does not bruise/bleed easily.  ?Psychiatric/Behavioral:  Negative for suicidal ideas. The patient is not nervous/anxious.   ? ?Patient Active Problem List  ? Diagnosis Date Noted  ? Trigger finger, right middle finger 05/13/2021  ? Trigger middle finger of left hand 05/13/2021  ? Leg cramps 09/07/2020  ? Frequent falls 04/02/2020  ? Low back pain 04/02/2020  ? Neuropathy 03/27/2020  ? Ataxia 01/25/2020  ? Imbalance 01/25/2020  ? Benign prostatic hyperplasia without lower urinary tract symptoms 12/13/2019  ? Prostate cancer screening 12/13/2019  ? Chest pain, atypical 06/16/2019  ? Right inguinal hernia 12/31/2014  ? Atrial fibrillation (HCC) 05/03/2014  ? HLD (hyperlipidemia) 05/03/2014  ? Lumbar canal stenosis 04/12/2012  ? ? ?No Known Allergies ? ?Past Surgical History:  ?Procedure Laterality Date  ? ABLATION    ? COLONOSCOPY  12/06/2014  ? [name]  ? EYE SURGERY  2011  ? HERNIA REPAIR Right 01/07/15  ? right inguinal repair, medium ultra Pro mesh  ? LAMINECTOMY  2004  ? ? ?Social History  ? ?Tobacco Use  ? Smoking status: Never  ? Smokeless tobacco: Never  ?Vaping Use  ? Vaping Use: Never used  ?  Substance Use Topics  ? Alcohol use: Yes  ?  Alcohol/week: 10.0 standard drinks  ?  Types: 10 Standard drinks or equivalent per week  ? Drug use: Never  ? ? ? ?Medication list has been reviewed and updated. ? ?Current Meds  ?Medication Sig  ? amiodarone (PACERONE) 200 MG tablet Take 0.5 tablets by mouth daily. Dr Lady Gary  ? Ascorbic Acid (VITAMIN C) 1000 MG tablet Take 1,000 mg by mouth daily.  ? aspirin 81 MG EC tablet Take 81 mg by mouth daily.  ?  Flaxseed, Linseed, (FLAX SEEDS PO) Take 2 Scoops by mouth daily.  ? Lactobacillus (PROBIOTIC ACIDOPHILUS PO) Take 1 capsule by mouth.  ? metoprolol tartrate (LOPRESSOR) 25 MG tablet Take 25 mg by mouth 2 (two) times daily. Dr Lady Gary  ? Multiple Vitamins-Minerals (MULTIVITAMIN WITH MINERALS) tablet Take 1 tablet by mouth daily.  ? Omega-3 1000 MG CAPS Take 1 capsule by mouth 2 (two) times daily. otc  ? pregabalin (LYRICA) 25 MG capsule Take 25 mg by mouth 2 (two) times daily. neuro  ? rosuvastatin (CRESTOR) 5 MG tablet TAKE 1 TABLET BY MOUTH DAILY  ? tadalafil (CIALIS) 20 MG tablet Take 20 mg by mouth daily as needed.  ? vitamin E 400 UNIT capsule Take 400 Units by mouth daily.  ? ? ? ?  01/22/2022  ? 11:02 AM 07/16/2021  ? 10:11 AM 05/13/2021  ?  8:22 AM 01/28/2021  ?  9:55 AM  ?GAD 7 : Generalized Anxiety Score  ?Nervous, Anxious, on Edge 0 0 0 0  ?Control/stop worrying 0 0 0 0  ?Worry too much - different things 0 0 0 0  ?Trouble relaxing 0 0 0 0  ?Restless 0 0 0 0  ?Easily annoyed or irritable 0 0 0 0  ?Afraid - awful might happen 0 0 0 0  ?Total GAD 7 Score 0 0 0 0  ?Anxiety Difficulty Not difficult at all  Not difficult at all   ? ? ? ?  01/22/2022  ? 11:02 AM  ?Depression screen PHQ 2/9  ?Decreased Interest 0  ?Down, Depressed, Hopeless 0  ?PHQ - 2 Score 0  ?Altered sleeping 0  ?Tired, decreased energy 0  ?Change in appetite 0  ?Feeling bad or failure about yourself  0  ?Trouble concentrating 0  ?Moving slowly or fidgety/restless 0  ?Suicidal thoughts 0  ?PHQ-9 Score 0  ?Difficult doing work/chores Not difficult at all  ? ? ?BP Readings from Last 3 Encounters:  ?01/22/22 134/80  ?01/01/22 122/75  ?07/16/21 (!) 142/74  ? ? ?Physical Exam ?Vitals and nursing note reviewed.  ?HENT:  ?   Head: Normocephalic.  ?   Right Ear: Tympanic membrane and external ear normal. There is no impacted cerumen.  ?   Left Ear: Tympanic membrane and external ear normal. There is no impacted cerumen.  ?   Nose: Nose normal. No congestion or  rhinorrhea.  ?Eyes:  ?   General: No scleral icterus.    ?   Right eye: No discharge.     ?   Left eye: No discharge.  ?   Conjunctiva/sclera: Conjunctivae normal.  ?   Pupils: Pupils are equal, round, and reactive to light.  ?Neck:  ?   Thyroid: No thyromegaly.  ?   Vascular: No JVD.  ?   Trachea: No tracheal deviation.  ?Cardiovascular:  ?   Rate and Rhythm: Normal rate and regular rhythm.  ?   Heart sounds: Normal heart sounds.  No murmur heard. ?  No friction rub. No gallop.  ?Pulmonary:  ?   Effort: No respiratory distress.  ?   Breath sounds: Normal breath sounds. No wheezing, rhonchi or rales.  ?Chest:  ?   Chest wall: No tenderness.  ?Abdominal:  ?   General: Bowel sounds are normal.  ?   Palpations: Abdomen is soft. There is no mass.  ?   Tenderness: There is no abdominal tenderness. There is no guarding or rebound.  ?Musculoskeletal:     ?   General: No tenderness. Normal range of motion.  ?   Cervical back: Normal range of motion and neck supple.  ?Lymphadenopathy:  ?   Cervical: No cervical adenopathy.  ?Skin: ?   General: Skin is warm.  ?   Findings: No rash.  ?Neurological:  ?   Mental Status: He is alert and oriented to person, place, and time.  ?   Cranial Nerves: No cranial nerve deficit.  ?   Deep Tendon Reflexes: Reflexes are normal and symmetric.  ? ? ?Wt Readings from Last 3 Encounters:  ?01/22/22 215 lb (97.5 kg)  ?07/16/21 216 lb (98 kg)  ?06/05/21 219 lb (99.3 kg)  ? ? ?BP 134/80   Pulse 60   Ht 6\' 3"  (1.905 m)   Wt 215 lb (97.5 kg)   BMI 26.87 kg/m?  ? ?Assessment and Plan: ? ?1. Mixed hyperlipidemia ?Chronic.  Controlled.  Stable.  Continue rosuvastatin 5 mg once a day.  Will check lipid panel.  As well as CMP for hepatic concerns. ?- rosuvastatin (CRESTOR) 5 MG tablet; Take 1 tablet (5 mg total) by mouth daily.  Dispense: 90 tablet; Refill: 1 ?- Lipid Panel With LDL/HDL Ratio ?- Comprehensive Metabolic Panel (CMET)  ? ? ?

## 2022-01-23 LAB — COMPREHENSIVE METABOLIC PANEL
ALT: 12 IU/L (ref 0–44)
AST: 17 IU/L (ref 0–40)
Albumin/Globulin Ratio: 1.7 (ref 1.2–2.2)
Albumin: 4.6 g/dL (ref 3.6–4.6)
Alkaline Phosphatase: 52 IU/L (ref 44–121)
BUN/Creatinine Ratio: 11 (ref 10–24)
BUN: 11 mg/dL (ref 8–27)
Bilirubin Total: 0.7 mg/dL (ref 0.0–1.2)
CO2: 25 mmol/L (ref 20–29)
Calcium: 10 mg/dL (ref 8.6–10.2)
Chloride: 99 mmol/L (ref 96–106)
Creatinine, Ser: 0.96 mg/dL (ref 0.76–1.27)
Globulin, Total: 2.7 g/dL (ref 1.5–4.5)
Glucose: 92 mg/dL (ref 70–99)
Potassium: 4.9 mmol/L (ref 3.5–5.2)
Sodium: 140 mmol/L (ref 134–144)
Total Protein: 7.3 g/dL (ref 6.0–8.5)
eGFR: 79 mL/min/{1.73_m2} (ref >59)

## 2022-01-23 LAB — LIPID PANEL WITH LDL/HDL RATIO
Cholesterol, Total: 194 mg/dL (ref 100–199)
HDL: 77 mg/dL (ref >39)
LDL Chol Calc (NIH): 100 mg/dL — ABNORMAL HIGH (ref 0–99)
LDL/HDL Ratio: 1.3 ratio (ref 0.0–3.6)
Triglycerides: 94 mg/dL (ref 0–149)
VLDL Cholesterol Cal: 17 mg/dL (ref 5–40)

## 2022-03-23 ENCOUNTER — Ambulatory Visit (INDEPENDENT_AMBULATORY_CARE_PROVIDER_SITE_OTHER): Payer: Medicare Other | Admitting: Family Medicine

## 2022-03-23 ENCOUNTER — Inpatient Hospital Stay (INDEPENDENT_AMBULATORY_CARE_PROVIDER_SITE_OTHER): Payer: Medicare Other | Admitting: Radiology

## 2022-03-23 ENCOUNTER — Encounter: Payer: Self-pay | Admitting: Family Medicine

## 2022-03-23 VITALS — BP 120/84 | HR 88 | Ht 75.0 in | Wt 220.0 lb

## 2022-03-23 DIAGNOSIS — M65331 Trigger finger, right middle finger: Secondary | ICD-10-CM | POA: Diagnosis not present

## 2022-03-23 DIAGNOSIS — M65332 Trigger finger, left middle finger: Secondary | ICD-10-CM

## 2022-03-23 MED ORDER — DICLOFENAC SODIUM 50 MG PO TBEC: 50.0000 mg | DELAYED_RELEASE_TABLET | Freq: Two times a day (BID) | ORAL | 0 refills | Status: DC | PRN

## 2022-03-23 NOTE — Assessment & Plan Note (Signed)
See additional assessment(s) for plan details. 

## 2022-03-23 NOTE — Progress Notes (Signed)
Primary Care / Sports Medicine Office Visit  Patient Information:  Patient ID: Trice Schmock., male DOB: [birth year] Age: 81 y.o. MRN: [id number]   Nils Honold Sr. is a pleasant 81 y.o. male presenting with the following:  Chief Complaint  Patient presents with   Hand Pain    Pt here with C/O bilateral hand pain, middle fingers are swollen and very painful for about 1 month now. Not sure he wants injections or medication.     Vitals:   03/23/22 1039  BP: 120/84  Pulse: 88  SpO2: 98%   Vitals:   03/23/22 1039  Weight: 220 lb (99.8 kg)  Height: 6\' 3"  (1.905 m)   Body mass index is 27.5 kg/m.  No results found.   Independent interpretation of notes and tests performed by another provider:   None  Procedures performed:   Procedure:  Injection of right third digit flexor tendon sheath under ultrasound guidance. Ultrasound guidance utilized for in-plane approach, no morphological abnormalities noted Samsung HS60 device utilized with permanent recording / reporting. Verbal informed consent obtained and verified. Skin prepped in a sterile fashion. Ethyl chloride for topical local analgesia.  Completed without difficulty and tolerated well. Medication: triamcinolone acetonide 40 mg/mL suspension for injection 0.5 mL total and 1 mL lidocaine 1% without epinephrine utilized for needle placement anesthetic Advised to contact for fevers/chills, erythema, induration, drainage, or persistent bleeding.  Procedure:  Injection of left third digit flexor tendon sheath under ultrasound guidance. Ultrasound guidance utilized for in-plane approach, subtle tenderness thickening noted in the area of tenderness Samsung HS60 device utilized with permanent recording / reporting. Verbal informed consent obtained and verified. Skin prepped in a sterile fashion. Ethyl chloride for topical local analgesia.  Completed without difficulty and tolerated well. Medication:  triamcinolone acetonide 40 mg/mL suspension for injection 0.5 mL total and 1 mL lidocaine 1% without epinephrine utilized for needle placement anesthetic Advised to contact for fevers/chills, erythema, induration, drainage, or persistent bleeding.   Pertinent History, Exam, Impression, and Recommendations:   Problem List Items Addressed This Visit       Musculoskeletal and Integument   Trigger finger, right middle finger - Primary    Patient presents with acute exacerbation of bilateral trigger finger involving the third digit.  Symptoms are greater on the left compared to the right with the left noting intermittent catching.  He has performed home exercises with limited response but otherwise no treatments.  Examination reveals no sensorimotor deficits other than limitations in flexion throughout the third digits due to pain, tenderness at the distal palmar crease at the third digit bilaterally, left greater than right, triggering elicited/reproduced during passive flexion/extension of the third digit on the left hand.  We reviewed various treatment strategies given his overall clinical course, chronicity of symptoms, and he did elect to proceed with ultrasound-guided tendon sheath injections, tolerated these well.  I have also advised as needed diclofenac and Occupational Therapy.  He can follow-up on as-needed basis.      Relevant Medications   diclofenac (VOLTAREN) 50 MG EC tablet   Other Relevant Orders   [place] LIMITED JOINT SPACE STRUCTURES UP BILAT   Ambulatory referral to Occupational Therapy   Trigger middle finger of left hand    See additional assessment(s) for plan details.      Relevant Medications   diclofenac (VOLTAREN) 50 MG EC tablet   Other Relevant Orders   [place] LIMITED JOINT SPACE STRUCTURES UP BILAT   Ambulatory referral  to Occupational Therapy     Orders & Medications Meds ordered this encounter  Medications   diclofenac (VOLTAREN) 50 MG EC tablet    Sig: Take 1  tablet (50 mg total) by mouth 2 (two) times daily as needed.    Dispense:  30 tablet    Refill:  0   Orders Placed This Encounter  Procedures   [place] LIMITED JOINT SPACE STRUCTURES UP BILAT   Ambulatory referral to Occupational Therapy     No follow-ups on file.     Jerrol Banana, MD   Primary Care Sports Medicine [facility]

## 2022-03-23 NOTE — Patient Instructions (Signed)
You have just been given a cortisone injection to reduce pain and inflammation. After the injection you may notice immediate relief of pain as a result of the Lidocaine. It is important to rest the area of the injection for 24 to 48 hours after the injection. There is a possibility of some temporary increased discomfort and swelling for up to 72 hours until the cortisone begins to work. If you do have pain, simply rest the joint and use ice. If you can tolerate over the counter medications, you can try Tylenol, Aleve, or Advil for added relief per package instructions. - Use diclofenac twice daily as needed with food until symptoms respond to cortisone - Occupational therapist will contact you for scheduling - Follow-up as needed

## 2022-03-23 NOTE — Assessment & Plan Note (Signed)
Patient presents with acute exacerbation of bilateral trigger finger involving the third digit.  Symptoms are greater on the left compared to the right with the left noting intermittent catching.  He has performed home exercises with limited response but otherwise no treatments.  Examination reveals no sensorimotor deficits other than limitations in flexion throughout the third digits due to pain, tenderness at the distal palmar crease at the third digit bilaterally, left greater than right, triggering elicited/reproduced during passive flexion/extension of the third digit on the left hand.  We reviewed various treatment strategies given his overall clinical course, chronicity of symptoms, and he did elect to proceed with ultrasound-guided tendon sheath injections, tolerated these well.  I have also advised as needed diclofenac and Occupational Therapy.  He can follow-up on as-needed basis.

## 2022-04-07 ENCOUNTER — Ambulatory Visit: Payer: Medicare Other | Admitting: Occupational Therapy

## 2022-04-13 ENCOUNTER — Encounter: Payer: Self-pay | Admitting: Occupational Therapy

## 2022-04-13 ENCOUNTER — Ambulatory Visit: Payer: Medicare Other | Attending: Family Medicine | Admitting: Occupational Therapy

## 2022-04-13 DIAGNOSIS — M6281 Muscle weakness (generalized): Secondary | ICD-10-CM | POA: Diagnosis not present

## 2022-04-13 DIAGNOSIS — R600 Localized edema: Secondary | ICD-10-CM | POA: Diagnosis not present

## 2022-04-13 DIAGNOSIS — M25641 Stiffness of right hand, not elsewhere classified: Secondary | ICD-10-CM

## 2022-04-13 DIAGNOSIS — M25642 Stiffness of left hand, not elsewhere classified: Secondary | ICD-10-CM

## 2022-04-18 NOTE — Therapy (Addendum)
[place] [facility] [address], [place], [address] Phone: [phone]   Fax:  [phone]  Occupational Therapy Evaluation  Patient Details  Name: Seth Polishchuk Sr. MRN: [id number] Date of Birth: 08/01/1941 Referring Provider (OT): Joseph Berkshire   Encounter Date: 04/13/2022 Rationale for Evaluation and Treatment Rehabilitation    OT End of Session - 04/26/22 1638     Visit Number 1    Number of Visits 12    Date for OT Re-Evaluation 05/11/22    OT Start Time 1030    OT Stop Time 1115    OT Time Calculation (min) 45 min    Activity Tolerance Patient tolerated treatment well    Behavior During Therapy [facility] for tasks assessed/performed             Past Medical History:  Diagnosis Date   AF (atrial fibrillation) (HCC)    Back pain    Hemorrhoids    Hyperlipidemia    Hypertension     Past Surgical History:  Procedure Laterality Date   ABLATION     COLONOSCOPY  12/06/2014   [name]   EYE SURGERY  2011   HERNIA REPAIR Right 01/07/15   right inguinal repair, medium ultra Pro mesh   LAMINECTOMY  2004    There were no vitals filed for this visit.   Subjective Assessment - 04/26/22 1633     Subjective  Pt denies any pain this date, reports 2 separate occasions of injections for trigger fingers.  Pt reports if he grips hands at night it tends to trigger the middle fingers when he is sleeping, no trouble when gripping golf club.  Oct 2022, had injections for trigger fingers, June of this year started to have pain and swelling, July 3 another shot in each finger, took a while for it to take effect about 1 week to 10 days.    Pertinent History Per chart from MD visit on 03/23/2022: Pt here with C/O bilateral hand pain, middle fingers are swollen and very painful for about 1 month now. Not sure he wants injections or medication.  Procedure: Injection of right third digit flexor tendon sheath under  ultrasound guidance.  Ultrasound guidance utilized for in-plane approach, no morphological abnormalities noted.  Injection of left third digit flexor tendon sheath under ultrasound guidance.Ultrasound guidance utilized for in-plane approach, subtle tenderness thickening noted in the area of tenderness    Patient Stated Goals Would like to get the swelling out of the hands and be able to use the hands more.    Pain Score 0-No pain               [facility] OT Assessment - 04/26/22 1639       Assessment   Medical Diagnosis trigger finger, right, left    Referring Provider (OT) matthews, jason    Onset Date/Surgical Date 03/23/22    Hand Dominance Right      ADL   ADL comments Pt reports independent with all basic self care tasks      Written Expression   Dominant Hand Right      Sensation   Additional Comments denies any numbness or tingling now, some in the past before the shots      AROM   Overall AROM Comments Full opposition, full fisting    Right Wrist Extension 52 Degrees    Right Wrist Flexion 65 Degrees    Left Wrist Extension 55 Degrees  Left Wrist Flexion 65 Degrees      Right Hand AROM   R Index  MCP 0-90 90 Degrees    R Index PIP 0-100 90 Degrees    R Long  MCP 0-90 80 Degrees    R Long PIP 0-100 90 Degrees    R Ring  MCP 0-90 90 Degrees    R Ring PIP 0-100 90 Degrees    R Little  MCP 0-90 90 Degrees    R Little PIP 0-100 90 Degrees      Left Hand AROM   L Index  MCP 0-90 90 Degrees    L Index PIP 0-100 90 Degrees    L Long  MCP 0-90 85 Degrees    L Long PIP 0-100 90 Degrees    L Ring  MCP 0-90 85 Degrees    L Ring PIP 0-100 90 Degrees    L Little  MCP 0-90 90 Degrees    L Little PIP 0-100 90 Degrees      Hand Function   Right Hand Grip (lbs) 75    Right Hand Lateral Pinch 20 lbs    Right Hand 3 Point Pinch 14 lbs    Left Hand Grip (lbs) 68    Left Hand Lateral Pinch 20 lbs    Left 3 point pinch 16 lbs            Instructed and performed  contrast, 11 mins, bilateral hands, to decrease edema, pain and improve ROM.  Issued home program outlining how to perform contrast at home.  Pt instructed on tendon gliding exercises with written instructions for home program.   Discussed modifications of grip and tool use with daily activities.                   OT Education - 04/26/22 1639     Education Details role of OT, goals, POC, contrast, ROM    Person(s) Educated Patient    Methods Explanation;Demonstration    Comprehension Verbalized understanding;Returned demonstration;Verbal cues required                 OT Long Term Goals - 04/26/22 1644       OT LONG TERM GOAL #1   Title Pt will demonstrate independence in home exercise program.    Baseline No current program    Time 4    Period Weeks    Status New    Target Date 05/11/22      OT LONG TERM GOAL #2   Title Pt will demonstrate understanding of joint protection principles during daily tasks    Baseline no current knowledge    Time 4    Period Weeks    Status New    Target Date 05/11/22      OT LONG TERM GOAL #3   Title Pt will demonstrate understanding of edema control measures to decrease pain, increase motion and strength    Baseline no current knowledge    Time 4    Period Weeks    Status New    Target Date 05/11/22                   Plan - 04/26/22 1647     Clinical Impression Statement Pt is a 81 yo male referred to OT for bilateral hand pain and triggering of right and left 3rd digits, s/p ultrasound guided injections on 03/23/2022.  Pt presents with edema, mild pain at times, muscle weakness, decreased gripping patterns.  Pt would benefit from skilled OT services to maximize safety and independence in necessary daily tasks.    Occupational performance deficits (Please refer to evaluation for details): ADL's;IADL's;Leisure    Body Structure / Function / Physical Skills ADL;Flexibility;Edema;ROM;UE functional use;Decreased  knowledge of use of DME;Pain;Strength;Coordination;Dexterity;IADL    Psychosocial Skills Environmental  Adaptations;Routines and Behaviors;Habits    Rehab Potential Good    Clinical Decision Making Several treatment options, min-mod task modification necessary    Comorbidities Affecting Occupational Performance: May have comorbidities impacting occupational performance    Modification or Assistance to Complete Evaluation  No modification of tasks or assist necessary to complete eval    OT Frequency 1x / week    OT Duration 4 weeks    OT Treatment/Interventions Self-care/ADL training;Fluidtherapy;Therapeutic exercise;Therapeutic activities;Cryotherapy;Moist Heat;Paraffin;Contrast Bath;Neuromuscular education;DME and/or AE instruction;Manual Therapy;Passive range of motion;Splinting;Patient/family education    Consulted and Agree with Plan of Care Patient             Patient will benefit from skilled therapeutic intervention in order to improve the following deficits and impairments:   Body Structure / Function / Physical Skills: ADL, Flexibility, Edema, ROM, UE functional use, Decreased knowledge of use of DME, Pain, Strength, Coordination, Dexterity, IADL   Psychosocial Skills: Environmental  Adaptations, Routines and Behaviors, Habits   Visit Diagnosis: Muscle weakness (generalized) - Plan: Ot plan of care cert/re-cert  Localized edema - Plan: Ot plan of care cert/re-cert  Stiffness of hand joint, right - Plan: Ot plan of care cert/re-cert  Stiffness of hand joint, left - Plan: Ot plan of care cert/re-cert    Problem List Patient Active Problem List   Diagnosis Date Noted   Trigger finger, right middle finger 05/13/2021   Trigger middle finger of left hand 05/13/2021   Leg cramps 09/07/2020   Frequent falls 04/02/2020   Low back pain 04/02/2020   Neuropathy 03/27/2020   Ataxia 01/25/2020   Imbalance 01/25/2020   Benign prostatic hyperplasia without lower urinary tract  symptoms 12/13/2019   Prostate cancer screening 12/13/2019   Chest pain, atypical 06/16/2019   Right inguinal hernia 12/31/2014   Atrial fibrillation (HCC) 05/03/2014   HLD (hyperlipidemia) 05/03/2014   Lumbar canal stenosis 04/12/2012   Najee Cowens T Camary Sosa, OTR/L, CLT  Cordie Beazley, OT 04/26/2022, 4:59 PM  [facility] [address], [place], [address] Phone: [phone]   Fax:  [phone]  Name: Seth Manalac Sr. MRN: [id number] Date of Birth: [birth year]

## 2022-04-21 ENCOUNTER — Ambulatory Visit: Payer: Medicare Other | Attending: Family Medicine | Admitting: Occupational Therapy

## 2022-04-21 DIAGNOSIS — M25641 Stiffness of right hand, not elsewhere classified: Secondary | ICD-10-CM | POA: Insufficient documentation

## 2022-04-21 DIAGNOSIS — M6281 Muscle weakness (generalized): Secondary | ICD-10-CM | POA: Insufficient documentation

## 2022-04-21 DIAGNOSIS — M25642 Stiffness of left hand, not elsewhere classified: Secondary | ICD-10-CM | POA: Diagnosis not present

## 2022-04-21 DIAGNOSIS — R600 Localized edema: Secondary | ICD-10-CM | POA: Insufficient documentation

## 2022-04-26 ENCOUNTER — Encounter: Payer: Self-pay | Admitting: Occupational Therapy

## 2022-04-26 NOTE — Therapy (Signed)
atypical 06/16/2019   Right inguinal hernia 12/31/2014   Atrial fibrillation ([place]) 05/03/2014   HLD (hyperlipidemia) 05/03/2014   Lumbar canal stenosis 04/12/2012   Rogena Deupree T Rodman Recupero, OTR/L, CLT  Tyneisha Hegeman, OT 04/26/2022, 5:12 PM  [facility] [address], [place], [address] Phone: [phone]   Fax:  [phone]  Name: Seth Baggerly Sr. MRN: [id number] Date of Birth: April 20, 1941  [facility] [address], [place], [address] Phone: [phone]   Fax:  [phone]  Occupational Therapy Treatment/Discharge Summary  Patient Details  Name: Seth Mcadory Sr. MRN: [id number] Date of Birth: 08-Apr-1941 Referring Provider (OT): Rosette Reveal   Encounter Date: 04/21/2022   OT End of Session - 04/26/22 1701     Visit Number 2    Number of Visits 4    Date for OT Re-Evaluation 05/11/22    OT Start Time 1115    OT Stop Time 1155    OT Time Calculation (min) 40 min    Activity Tolerance Patient tolerated treatment well    Behavior During Therapy [facility] for tasks assessed/performed             Past Medical History:  Diagnosis Date   AF (atrial fibrillation) ([place])    Back pain    Hemorrhoids    Hyperlipidemia    Hypertension     Past Surgical History:  Procedure Laterality Date   ABLATION     COLONOSCOPY  12/06/2014   Dr. Candace Cruise   EYE SURGERY  2011   HERNIA REPAIR Right 01/07/15   right inguinal repair, medium ultra Pro mesh   LAMINECTOMY  2004    There were no vitals filed for this visit.   Subjective Assessment - 04/26/22 1701     Pertinent History Per chart from MD visit on 03/23/2022: Pt here with C/O bilateral hand pain, middle fingers are swollen and very painful for about 1 month now. Not sure he wants injections or medication.  Procedure: Injection of right third digit flexor tendon sheath under ultrasound guidance.  Ultrasound guidance utilized for in-plane approach, no morphological abnormalities noted.  Injection of left third digit flexor tendon sheath under ultrasound guidance.Ultrasound guidance utilized for in-plane approach, subtle tenderness thickening noted in the area of tenderness    Patient Stated Goals Would like to get the swelling out of the hands and be able to use the hands more.             Contrast:   Pt seen for contrast this date for edema  management, decrease pain and increase motion, performed on bilateral hands, 11 mins.  ADL:  Pt educated and instructed on joint protection principles for ADL and IADL tasks including increase of grips on golf clubs and tools used at home.  Additional handouts issued for kitchen management tasks and a variety of AE options for tool use to promote joint protection.    Therapeutic Exercise: Pt with full composite fisting of bilateral hands without evidence of triggering this date.  Tendon gliding exercises independently.  ROM reassessed and within normal limits.     Discussed options for use of ionto if needed in the future if injections wear off and pain returns.   Pt has met goals and ready for discharge               OT Education - 04/26/22 1701     Education Details contrast, ROM, tool modification, joint protection    Person(s) Educated Patient    Methods Explanation;Demonstration    Comprehension Verbalized understanding;Returned demonstration;Verbal cues required                 OT Long Term Goals - 04/26/22 1706       OT LONG TERM GOAL #1   Title Pt will demonstrate independence in home exercise program.    Baseline No current  atypical 06/16/2019   Right inguinal hernia 12/31/2014   Atrial fibrillation ([place]) 05/03/2014   HLD (hyperlipidemia) 05/03/2014   Lumbar canal stenosis 04/12/2012   Rogena Deupree T Rodman Recupero, OTR/L, CLT  Tyneisha Hegeman, OT 04/26/2022, 5:12 PM  [facility] [address], [place], [address] Phone: [phone]   Fax:  [phone]  Name: Seth Baggerly Sr. MRN: [id number] Date of Birth: April 20, 1941

## 2022-04-26 NOTE — Addendum Note (Signed)
Addended by: Garlon Hatchet T on: 04/26/2022 04:59 PM   Modules accepted: Orders

## 2022-04-29 DIAGNOSIS — D225 Melanocytic nevi of trunk: Secondary | ICD-10-CM | POA: Diagnosis not present

## 2022-04-29 DIAGNOSIS — Z86006 Personal history of melanoma in-situ: Secondary | ICD-10-CM | POA: Diagnosis not present

## 2022-04-29 DIAGNOSIS — D485 Neoplasm of uncertain behavior of skin: Secondary | ICD-10-CM | POA: Diagnosis not present

## 2022-04-29 DIAGNOSIS — D2272 Melanocytic nevi of left lower limb, including hip: Secondary | ICD-10-CM | POA: Diagnosis not present

## 2022-04-29 DIAGNOSIS — D045 Carcinoma in situ of skin of trunk: Secondary | ICD-10-CM | POA: Diagnosis not present

## 2022-04-29 DIAGNOSIS — D2261 Melanocytic nevi of right upper limb, including shoulder: Secondary | ICD-10-CM | POA: Diagnosis not present

## 2022-04-29 DIAGNOSIS — L570 Actinic keratosis: Secondary | ICD-10-CM | POA: Diagnosis not present

## 2022-05-01 DIAGNOSIS — Z23 Encounter for immunization: Secondary | ICD-10-CM | POA: Diagnosis not present

## 2022-05-02 DIAGNOSIS — Z23 Encounter for immunization: Secondary | ICD-10-CM | POA: Diagnosis not present

## 2022-05-07 DIAGNOSIS — H35373 Puckering of macula, bilateral: Secondary | ICD-10-CM | POA: Diagnosis not present

## 2022-05-07 DIAGNOSIS — Z961 Presence of intraocular lens: Secondary | ICD-10-CM | POA: Diagnosis not present

## 2022-05-07 DIAGNOSIS — H5051 Esophoria: Secondary | ICD-10-CM | POA: Diagnosis not present

## 2022-05-07 DIAGNOSIS — H532 Diplopia: Secondary | ICD-10-CM | POA: Diagnosis not present

## 2022-05-13 DIAGNOSIS — D045 Carcinoma in situ of skin of trunk: Secondary | ICD-10-CM | POA: Diagnosis not present

## 2022-07-14 DIAGNOSIS — I480 Paroxysmal atrial fibrillation: Secondary | ICD-10-CM | POA: Diagnosis not present

## 2022-07-14 DIAGNOSIS — R0789 Other chest pain: Secondary | ICD-10-CM | POA: Diagnosis not present

## 2022-07-14 DIAGNOSIS — E782 Mixed hyperlipidemia: Secondary | ICD-10-CM | POA: Diagnosis not present

## 2022-07-14 DIAGNOSIS — R972 Elevated prostate specific antigen [PSA]: Secondary | ICD-10-CM | POA: Diagnosis not present

## 2022-07-27 ENCOUNTER — Encounter: Payer: Self-pay | Admitting: Family Medicine

## 2022-07-27 ENCOUNTER — Ambulatory Visit (INDEPENDENT_AMBULATORY_CARE_PROVIDER_SITE_OTHER): Payer: Medicare Other | Admitting: Family Medicine

## 2022-07-27 DIAGNOSIS — E782 Mixed hyperlipidemia: Secondary | ICD-10-CM

## 2022-07-27 MED ORDER — ROSUVASTATIN CALCIUM 5 MG PO TABS
5.0000 mg | ORAL_TABLET | Freq: Every day | ORAL | 1 refills | Status: DC
Start: 2022-07-27 — End: 2023-01-25

## 2022-07-27 MED ORDER — AMIODARONE HCL 200 MG PO TABS: 200.0000 mg | ORAL_TABLET | Freq: Every day | ORAL | 1 refills | Status: DC

## 2022-07-27 NOTE — Patient Instructions (Signed)

## 2022-07-27 NOTE — Progress Notes (Signed)
Date:  07/27/2022   Name:  Seth Roye Sr.   DOB:  24-Sep-1940   MRN:  [id number]   Chief Complaint: Hyperlipidemia  Hyperlipidemia This is a chronic problem. The current episode started more than 1 year ago. The problem is controlled. Recent lipid tests were reviewed and are normal. He has no history of chronic renal disease, diabetes, hypothyroidism, liver disease, obesity or nephrotic syndrome. Pertinent negatives include no chest pain, focal sensory loss, focal weakness, leg pain, myalgias or shortness of breath. Current antihyperlipidemic treatment includes statins. The current treatment provides moderate improvement of lipids. There are no compliance problems.  Risk factors for coronary artery disease include dyslipidemia.    Lab Results  Component Value Date   NA 140 01/22/2022   K 4.9 01/22/2022   CO2 25 01/22/2022   GLUCOSE 92 01/22/2022   BUN 11 01/22/2022   CREATININE 0.96 01/22/2022   CALCIUM 10.0 01/22/2022   EGFR 79 01/22/2022   GFRNONAA >60 05/09/2019   Lab Results  Component Value Date   CHOL 194 01/22/2022   HDL 77 01/22/2022   LDLCALC 100 (H) 01/22/2022   TRIG 94 01/22/2022   CHOLHDL 2.4 11/23/2016   No results found for: "TSH" No results found for: "HGBA1C" Lab Results  Component Value Date   WBC 8.9 05/09/2019   HGB 13.6 05/09/2019   HCT 39.3 05/09/2019   MCV 92.9 05/09/2019   PLT 190 05/09/2019   Lab Results  Component Value Date   ALT 12 01/22/2022   AST 17 01/22/2022   ALKPHOS 52 01/22/2022   BILITOT 0.7 01/22/2022   No results found for: "25OHVITD2", "25OHVITD3", "VD25OH"   Review of Systems  Constitutional:  Negative for chills, fatigue and fever.  HENT:  Negative for congestion, sinus pressure, sinus pain and sneezing.   Eyes:  Negative for photophobia.  Respiratory:  Negative for shortness of breath and wheezing.   Cardiovascular:  Negative for chest pain, palpitations and leg swelling.  Gastrointestinal:  Negative for  abdominal distention and abdominal pain.  Endocrine: Negative for polydipsia, polyphagia and polyuria.  Musculoskeletal:  Negative for myalgias.  Neurological:  Negative for focal weakness.    Patient Active Problem List   Diagnosis Date Noted   Trigger finger, right middle finger 05/13/2021   Trigger middle finger of left hand 05/13/2021   Leg cramps 09/07/2020   Frequent falls 04/02/2020   Low back pain 04/02/2020   Neuropathy 03/27/2020   Ataxia 01/25/2020   Imbalance 01/25/2020   Benign prostatic hyperplasia without lower urinary tract symptoms 12/13/2019   Prostate cancer screening 12/13/2019   Chest pain, atypical 06/16/2019   Right inguinal hernia 12/31/2014   Atrial fibrillation ([facility]) 05/03/2014   HLD (hyperlipidemia) 05/03/2014   Lumbar canal stenosis 04/12/2012    No Known Allergies  Past Surgical History:  Procedure Laterality Date   ABLATION     COLONOSCOPY  12/06/2014   [name]   EYE SURGERY  2011   HERNIA REPAIR Right 01/07/15   right inguinal repair, medium ultra Pro mesh   LAMINECTOMY  2004    Social History   Tobacco Use   Smoking status: Never   Smokeless tobacco: Never  Vaping Use   Vaping Use: Never used  Substance Use Topics   Alcohol use: Yes    Alcohol/week: 10.0 standard drinks of alcohol    Types: 10 Standard drinks or equivalent per week   Drug use: Never     Medication list has been reviewed  and updated.  Current Meds  Medication Sig   Ascorbic Acid (VITAMIN C) 1000 MG tablet Take 1,000 mg by mouth daily.   aspirin 81 MG EC tablet Take 81 mg by mouth daily.   Flaxseed, Linseed, (FLAX SEEDS PO) Take 2 Scoops by mouth daily.   Lactobacillus (PROBIOTIC ACIDOPHILUS PO) Take 1 capsule by mouth.   metoprolol tartrate (LOPRESSOR) 25 MG tablet Take 25 mg by mouth 2 (two) times daily. Dr Lady Gary   Multiple Vitamins-Minerals (MULTIVITAMIN WITH MINERALS) tablet Take 1 tablet by mouth daily.   Omega-3 1000 MG CAPS Take 1 capsule by mouth 2 (two)  times daily. otc   rosuvastatin (CRESTOR) 5 MG tablet Take 1 tablet (5 mg total) by mouth daily.   tadalafil (CIALIS) 20 MG tablet Take 20 mg by mouth daily as needed.   vitamin E 400 UNIT capsule Take 400 Units by mouth daily.       07/27/2022   10:32 AM 03/23/2022   10:44 AM 01/22/2022   11:02 AM 07/16/2021   10:11 AM  GAD 7 : Generalized Anxiety Score  Nervous, Anxious, on Edge 0 0 0 0  Control/stop worrying 0 0 0 0  Worry too much - different things 0 0 0 0  Trouble relaxing 0 0 0 0  Restless 0 0 0 0  Easily annoyed or irritable 0 0 0 0  Afraid - awful might happen 0 0 0 0  Total GAD 7 Score 0 0 0 0  Anxiety Difficulty Not difficult at all  Not difficult at all        07/27/2022   10:31 AM 03/23/2022   10:44 AM 01/22/2022   11:02 AM  Depression screen PHQ 2/9  Decreased Interest 0 0 0  Down, Depressed, Hopeless 0 0 0  PHQ - 2 Score 0 0 0  Altered sleeping 0 0 0  Tired, decreased energy 0 0 0  Change in appetite 0 0 0  Feeling bad or failure about yourself  0 0 0  Trouble concentrating 0 0 0  Moving slowly or fidgety/restless 0 0 0  Suicidal thoughts 0 0 0  PHQ-9 Score 0 0 0  Difficult doing work/chores Not difficult at all Not difficult at all Not difficult at all    BP Readings from Last 3 Encounters:  07/27/22 120/76  03/23/22 120/84  01/22/22 134/80    Physical Exam Vitals and nursing note reviewed.  HENT:     Right Ear: Tympanic membrane, ear canal and external ear normal. There is no impacted cerumen.     Left Ear: Tympanic membrane, ear canal and external ear normal. There is no impacted cerumen.     Nose: Nose normal.     Mouth/Throat:     Mouth: Mucous membranes are moist.  Eyes:     Extraocular Movements: Extraocular movements intact.     Conjunctiva/sclera: Conjunctivae normal.     Pupils: Pupils are equal, round, and reactive to light.  Cardiovascular:     Heart sounds: No murmur heard.    No friction rub. No gallop.  Pulmonary:     Breath  sounds: No wheezing, rhonchi or rales.     Wt Readings from Last 3 Encounters:  07/27/22 215 lb (97.5 kg)  03/23/22 220 lb (99.8 kg)  01/22/22 215 lb (97.5 kg)    BP 120/76   Pulse (!) 54   Ht 6\' 3"  (1.905 m)   Wt 215 lb (97.5 kg)   SpO2 96%   BMI  26.87 kg/m   Assessment and Plan:  1. Mixed hyperlipidemia Chronic.  Controlled.  Stable.  Continue rosuvastatin 5 mg once a day.  Review of previous lipid panel is acceptable and will recheck in 6 months. - rosuvastatin (CRESTOR) 5 MG tablet; Take 1 tablet (5 mg total) by mouth daily.  Dispense: 90 tablet; Refill: 1    Elizabeth Sauer, MD

## 2022-10-05 ENCOUNTER — Ambulatory Visit (INDEPENDENT_AMBULATORY_CARE_PROVIDER_SITE_OTHER): Payer: Medicare Other | Admitting: Family Medicine

## 2022-10-05 ENCOUNTER — Encounter: Payer: Self-pay | Admitting: Family Medicine

## 2022-10-05 VITALS — BP 120/78 | HR 68 | Temp 98.5°F | Ht 63.0 in | Wt 217.0 lb

## 2022-10-05 DIAGNOSIS — R1319 Other dysphagia: Secondary | ICD-10-CM

## 2022-10-05 DIAGNOSIS — R051 Acute cough: Secondary | ICD-10-CM

## 2022-10-05 DIAGNOSIS — J0100 Acute maxillary sinusitis, unspecified: Secondary | ICD-10-CM | POA: Diagnosis not present

## 2022-10-05 MED ORDER — GUAIFENESIN-CODEINE 100-10 MG/5ML PO SYRP: 5.0000 mL | ORAL_SOLUTION | Freq: Three times a day (TID) | ORAL | 0 refills | Status: DC | PRN

## 2022-10-05 MED ORDER — AMOXICILLIN-POT CLAVULANATE 875-125 MG PO TABS: 1.0000 | ORAL_TABLET | Freq: Two times a day (BID) | ORAL | 0 refills | Status: DC

## 2022-10-05 NOTE — Progress Notes (Signed)
Date:  10/05/2022   Name:  Seth Kalich Sr.   DOB:  [birth year]   MRN:  [id number]   Chief Complaint: trouble swallowing (Got a piece of [place] stuck when swallowed on a flight first week of December. Couldn't ever get the [place] up, so every time he swallowed "saliva" it would come back up.) and Sinusitis (Started last Tuesday- cough, cong, bloody drainage from nose)  Sinusitis This is a new problem. The current episode started in the past 7 days. The problem has been gradually worsening since onset. There has been no fever. The pain is moderate. Associated symptoms include congestion, coughing and a hoarse voice. Pertinent negatives include no chills, diaphoresis, ear pain, headaches, neck pain, shortness of breath, sinus pressure, sneezing, sore throat or swollen glands. Past treatments include nothing.  GI Problem Primary symptoms do not include abdominal pain, diarrhea, jaundice or hematochezia.  The illness is also significant for dysphagia. The illness does not include chills, anorexia, odynophagia, bloating, constipation, tenesmus, back pain or itching. Associated medical issues do not include liver disease, PUD, irritable bowel syndrome or diverticulitis.    Lab Results  Component Value Date   NA 140 01/22/2022   K 4.9 01/22/2022   CO2 25 01/22/2022   GLUCOSE 92 01/22/2022   BUN 11 01/22/2022   CREATININE 0.96 01/22/2022   CALCIUM 10.0 01/22/2022   EGFR 79 01/22/2022   GFRNONAA >60 05/09/2019   Lab Results  Component Value Date   CHOL 194 01/22/2022   HDL 77 01/22/2022   LDLCALC 100 (H) 01/22/2022   TRIG 94 01/22/2022   CHOLHDL 2.4 11/23/2016   No results found for: "TSH" No results found for: "HGBA1C" Lab Results  Component Value Date   WBC 8.9 05/09/2019   HGB 13.6 05/09/2019   HCT 39.3 05/09/2019   MCV 92.9 05/09/2019   PLT 190 05/09/2019   Lab Results  Component Value Date   ALT 12 01/22/2022   AST 17 01/22/2022   ALKPHOS 52 01/22/2022    BILITOT 0.7 01/22/2022   No results found for: "25OHVITD2", "25OHVITD3", "VD25OH"   Review of Systems  Constitutional:  Negative for chills and diaphoresis.  HENT:  Positive for congestion and hoarse voice. Negative for ear pain, sinus pressure, sneezing and sore throat.   Respiratory:  Positive for cough. Negative for shortness of breath.   Cardiovascular:  Negative for chest pain, palpitations and leg swelling.  Gastrointestinal:  Positive for dysphagia. Negative for abdominal pain, anorexia, bloating, constipation, diarrhea, hematochezia and jaundice.  Musculoskeletal:  Negative for back pain and neck pain.  Skin:  Negative for itching.  Neurological:  Negative for headaches.    Patient Active Problem List   Diagnosis Date Noted   Trigger finger, right middle finger 05/13/2021   Trigger middle finger of left hand 05/13/2021   Leg cramps 09/07/2020   Frequent falls 04/02/2020   Low back pain 04/02/2020   Neuropathy 03/27/2020   Ataxia 01/25/2020   Imbalance 01/25/2020   Benign prostatic hyperplasia without lower urinary tract symptoms 12/13/2019   Prostate cancer screening 12/13/2019   Chest pain, atypical 06/16/2019   Right inguinal hernia 12/31/2014   Atrial fibrillation ([facility]) 05/03/2014   HLD (hyperlipidemia) 05/03/2014   Lumbar canal stenosis 04/12/2012    No Known Allergies  Past Surgical History:  Procedure Laterality Date   ABLATION     COLONOSCOPY  12/06/2014   [name]   EYE SURGERY  2011   HERNIA REPAIR Right 01/07/15  right inguinal repair, medium ultra Pro mesh   LAMINECTOMY  2004    Social History   Tobacco Use   Smoking status: Never   Smokeless tobacco: Never  Vaping Use   Vaping Use: Never used  Substance Use Topics   Alcohol use: Yes    Alcohol/week: 10.0 standard drinks of alcohol    Types: 10 Standard drinks or equivalent per week   Drug use: Never     Medication list has been reviewed and updated.  No outpatient medications have been  marked as taking for the 10/05/22 encounter (Office Visit) with Duanne Limerick, MD.       10/05/2022    4:01 PM 07/27/2022   10:32 AM 03/23/2022   10:44 AM 01/22/2022   11:02 AM  GAD 7 : Generalized Anxiety Score  Nervous, Anxious, on Edge 0 0 0 0  Control/stop worrying 0 0 0 0  Worry too much - different things 0 0 0 0  Trouble relaxing 0 0 0 0  Restless 0 0 0 0  Easily annoyed or irritable 0 0 0 0  Afraid - awful might happen 0 0 0 0  Total GAD 7 Score 0 0 0 0  Anxiety Difficulty Not difficult at all Not difficult at all  Not difficult at all       10/05/2022    4:01 PM 07/27/2022   10:31 AM 03/23/2022   10:44 AM  Depression screen PHQ 2/9  Decreased Interest 0 0 0  Down, Depressed, Hopeless 0 0 0  PHQ - 2 Score 0 0 0  Altered sleeping 0 0 0  Tired, decreased energy 0 0 0  Change in appetite 0 0 0  Feeling bad or failure about yourself  0 0 0  Trouble concentrating 0 0 0  Moving slowly or fidgety/restless 0 0 0  Suicidal thoughts 0 0 0  PHQ-9 Score 0 0 0  Difficult doing work/chores Not difficult at all Not difficult at all Not difficult at all    BP Readings from Last 3 Encounters:  10/05/22 120/78  07/27/22 120/76  03/23/22 120/84    Physical Exam Vitals and nursing note reviewed.  HENT:     Head: Normocephalic.     Right Ear: Tympanic membrane and external ear normal. There is no impacted cerumen.     Left Ear: Tympanic membrane and external ear normal. There is no impacted cerumen.     Nose: No congestion or rhinorrhea.     Mouth/Throat:     Pharynx: No oropharyngeal exudate or posterior oropharyngeal erythema.  Eyes:     Pupils: Pupils are equal, round, and reactive to light.  Neck:     Thyroid: No thyromegaly.     Vascular: No JVD.     Trachea: No tracheal deviation.  Cardiovascular:     Rate and Rhythm: Normal rate and regular rhythm.     Heart sounds: Normal heart sounds. No murmur heard.    No friction rub. No gallop.  Pulmonary:     Effort: No  respiratory distress.     Breath sounds: Normal breath sounds. No wheezing or rales.  Abdominal:     General: Bowel sounds are normal.     Palpations: Abdomen is soft. There is no mass.     Tenderness: There is no abdominal tenderness. There is no guarding or rebound.  Musculoskeletal:        General: No tenderness.     Cervical back: Normal range of motion and  neck supple.  Lymphadenopathy:     Cervical: No cervical adenopathy.  Skin:    Findings: No rash.  Neurological:     Mental Status: He is alert.     Cranial Nerves: No cranial nerve deficit.     Deep Tendon Reflexes: Reflexes are normal and symmetric.     Wt Readings from Last 3 Encounters:  10/05/22 217 lb (98.4 kg)  07/27/22 215 lb (97.5 kg)  03/23/22 220 lb (99.8 kg)    BP 120/78   Pulse 68   Temp 98.5 F (36.9 C) (Oral)   Ht 5\' 3"  (1.6 m)   Wt 217 lb (98.4 kg)   SpO2 98%   BMI 38.44 kg/m   Assessment and Plan: 1. Acute maxillary sinusitis, recurrence not specified New onset.  Persistent.  Relatively stable.  Exam and history is consistent with acute maxillary sinusitis and we will initiate Augmentin 875 mg twice a day for 10 days. - amoxicillin-clavulanate (AUGMENTIN) 875-125 MG tablet; Take 1 tablet by mouth 2 (two) times daily.  Dispense: 20 tablet; Refill: 0  2. Esophageal dysphagia New onset but is a recurrence of a previous issue.  While in flight going to a cruise in [place] patient experienced esophageal obstruction while eating [place].  Patient was unable to handle saliva was seen and [facility] ER in which they administered fluids and a heavy oil like substance which allowed the passage of the esophageal blockage.  In 2013 patient had procedure to dilate a esophageal stricture.  Currently patient is supposed to be taking just soft foods avoiding chicken steak and large pieces of bread.  We will refer to gastroenterology for consideration of endoscopy and possible enlargement of esophageal stricture which is  suspected.  In the meantime he is going to be seen tomorrow morning and we will hold on putting him on any medications until it is reviewed by gastroenterology. - Ambulatory referral to Gastroenterology  3. Acute cough Acute cough which is bothersome at night but also probably needs to have some control given the esophageal stricture.  Will treat with Robitussin AC teaspoon every 6 hours as needed.    Elizabeth Sauer, MD

## 2022-10-06 DIAGNOSIS — K5900 Constipation, unspecified: Secondary | ICD-10-CM | POA: Diagnosis not present

## 2022-10-06 DIAGNOSIS — R1310 Dysphagia, unspecified: Secondary | ICD-10-CM | POA: Diagnosis not present

## 2022-10-06 DIAGNOSIS — T18128A Food in esophagus causing other injury, initial encounter: Secondary | ICD-10-CM | POA: Diagnosis not present

## 2022-10-06 DIAGNOSIS — K222 Esophageal obstruction: Secondary | ICD-10-CM | POA: Diagnosis not present

## 2022-10-06 DIAGNOSIS — W44F3XA Food entering into or through a natural orifice, initial encounter: Secondary | ICD-10-CM | POA: Diagnosis not present

## 2022-10-08 ENCOUNTER — Telehealth: Payer: Self-pay | Admitting: Family Medicine

## 2022-10-08 NOTE — Telephone Encounter (Signed)
Copied from [place] [phone]. Topic: Medicare AWV >> Oct 08, 2022 11:33 AM Devoria Glassing wrote: Reason for CRM: Left message tor patient to reschedule Medicare Annual Wellness Visit (AWV) with [facility] Health Advisor.  Appointment can be an offiice/telephone or virtual visit;  Please call [phone] ask for [facility].

## 2022-10-28 NOTE — H&P (Signed)
Pre-Procedure H&P   Patient ID: Seth Stewart Sr. is a 82 y.o. male.  Gastroenterology Provider: [name], DO  PCP: Juline Patch, MD  Date: 10/29/2022  HPI Mr. Seth Gillespie Sr. is a 82 y.o. male who presents today for Esophagogastroduodenoscopy for Dysphagia; Schatzki's ring .  Patient recently on a trip to [place] in December where he suffered from a food impaction.  This is able to pass without endoscopy.  Since then he has been eating softer foods to avoid further episodes.  He does have a history of dysphagia to solids but no issues with liquids or pills.  He denies any odynophagia.  He has rare reflux that improves with Tums. ASA has been held  Underwent EGD in 2013 and dilated with a 62 French savary dilator over guidewire.  Most recent lab work hemoglobin 13.2 MCV 96.5 platelets 198,000 creatinine 0.9  Patient has atrial fibrillation, but is not currently on anticoagulation   Past Medical History:  Diagnosis Date   AF (atrial fibrillation) (HCC)    Back pain    Hemorrhoids    Hyperlipidemia    Hypertension     Past Surgical History:  Procedure Laterality Date   ABLATION     COLONOSCOPY  12/06/2014   Dr. Candace Cruise   EYE SURGERY  2011   HERNIA REPAIR Right 01/07/15   right inguinal repair, medium ultra Pro mesh   LAMINECTOMY  2004    Family History No h/o GI disease or malignancy  Review of Systems  Constitutional:  Negative for activity change, appetite change, chills, diaphoresis, fatigue, fever and unexpected weight change.  HENT:  Positive for trouble swallowing. Negative for voice change.   Respiratory:  Negative for shortness of breath and wheezing.   Cardiovascular:  Negative for chest pain, palpitations and leg swelling.  Gastrointestinal:  Negative for abdominal distention, abdominal pain, anal bleeding, blood in stool, constipation, diarrhea, nausea and vomiting.  Musculoskeletal:  Negative for arthralgias and myalgias.   Skin:  Negative for color change and pallor.  Neurological:  Negative for dizziness, syncope and weakness.  Psychiatric/Behavioral:  Negative for confusion. The patient is not nervous/anxious.   All other systems reviewed and are negative.    Medications No current facility-administered medications on file prior to encounter.   Current Outpatient Medications on File Prior to Encounter  Medication Sig Dispense Refill   amiodarone (PACERONE) 100 MG tablet Take 100 mg by mouth daily.     metoprolol tartrate (LOPRESSOR) 25 MG tablet Take 25 mg by mouth 2 (two) times daily. Dr Ubaldo Glassing  0   Multiple Vitamins-Minerals (MULTIVITAMIN WITH MINERALS) tablet Take 1 tablet by mouth daily.     Omega-3 1000 MG CAPS Take 1 capsule by mouth 2 (two) times daily. otc     rosuvastatin (CRESTOR) 5 MG tablet Take 1 tablet (5 mg total) by mouth daily. 90 tablet 1   vitamin E 400 UNIT capsule Take 400 Units by mouth daily.     amoxicillin-clavulanate (AUGMENTIN) 875-125 MG tablet Take 1 tablet by mouth 2 (two) times daily. 20 tablet 0   Ascorbic Acid (VITAMIN C) 1000 MG tablet Take 1,000 mg by mouth daily.     aspirin 81 MG EC tablet Take 81 mg by mouth daily.     Flaxseed, Linseed, (FLAX SEEDS PO) Take 2 Scoops by mouth daily.     guaiFENesin-codeine (ROBITUSSIN AC) 100-10 MG/5ML syrup Take 5 mLs by mouth 3 (three) times daily as needed for cough. [place]  mL 0   Lactobacillus (PROBIOTIC ACIDOPHILUS PO) Take 1 capsule by mouth.     tadalafil (CIALIS) 20 MG tablet Take 20 mg by mouth daily as needed.      Pertinent medications related to GI and procedure were reviewed by me with the patient prior to the procedure   Current Facility-Administered Medications:    0.9 %  sodium chloride infusion, , Intravenous, Continuous, Joseff, Luckman, DO, Last Rate: 20 mL/hr at 10/29/22 1052, New Bag at 10/29/22 1052      No Known Allergies Allergies were reviewed by me prior to the procedure  Objective   Body mass  index is 26.62 kg/m. Vitals:   10/29/22 1034  BP: (!) 150/78  Pulse: (!) 53  Resp: 18  Temp: (!) 97 F (36.1 C)  TempSrc: Temporal  SpO2: 99%  Weight: 96.6 kg  Height: '6\' 3"'$  (1.905 m)     Physical Exam Vitals and nursing note reviewed.  Constitutional:      General: He is not in acute distress.    Appearance: Normal appearance. He is not ill-appearing, toxic-appearing or diaphoretic.  HENT:     Head: Normocephalic and atraumatic.     Nose: Nose normal.     Mouth/Throat:     Mouth: Mucous membranes are moist.     Pharynx: Oropharynx is clear.  Eyes:     General: No scleral icterus.    Extraocular Movements: Extraocular movements intact.  Cardiovascular:     Rate and Rhythm: Regular rhythm. Bradycardia present.     Heart sounds: Normal heart sounds. No murmur heard.    No friction rub. No gallop.  Pulmonary:     Effort: Pulmonary effort is normal. No respiratory distress.     Breath sounds: Normal breath sounds. No wheezing, rhonchi or rales.  Abdominal:     General: Bowel sounds are normal. There is no distension.     Palpations: Abdomen is soft.     Tenderness: There is no abdominal tenderness. There is no guarding or rebound.  Musculoskeletal:     Cervical back: Neck supple.     Right lower leg: No edema.     Left lower leg: No edema.  Skin:    General: Skin is warm and dry.     Coloration: Skin is not jaundiced or pale.  Neurological:     General: No focal deficit present.     Mental Status: He is alert and oriented to person, place, and time. Mental status is at baseline.  Psychiatric:        Mood and Affect: Mood normal.        Behavior: Behavior normal.        Thought Content: Thought content normal.        Judgment: Judgment normal.      Assessment:  Mr. Seth Stewart. is a 82 y.o. male  who presents today for Esophagogastroduodenoscopy for Dysphagia; Schatzki's ring .  Plan:  Esophagogastroduodenoscopy with possible intervention  today  Esophagogastroduodenoscopy with possible biopsy, control of bleeding, polypectomy, and interventions as necessary has been discussed with the patient/patient representative. Informed consent was obtained from the patient/patient representative after explaining the indication, nature, and risks of the procedure including but not limited to death, bleeding, perforation, missed neoplasm/lesions, cardiorespiratory compromise, and reaction to medications. Opportunity for questions was given and appropriate answers were provided. Patient/patient representative has verbalized understanding is amenable to undergoing the procedure.   [name], DO  [facility]  Portions  of the record may have been created with voice recognition software. Occasional wrong-word or 'sound-a-like' substitutions may have occurred due to the inherent limitations of voice recognition software.  Read the chart carefully and recognize, using context, where substitutions may have occurred.

## 2022-10-29 ENCOUNTER — Other Ambulatory Visit: Payer: Self-pay

## 2022-10-29 ENCOUNTER — Encounter
Admission: RE | Disposition: A | Discharge status: Home / Self Care | Payer: Self-pay | Source: Ambulatory Visit | Attending: Gastroenterology

## 2022-10-29 ENCOUNTER — Ambulatory Visit
Admission: RE | Admit: 2022-10-29 | Discharge: 2022-10-29 | Disposition: A | Discharge status: Home / Self Care | Payer: Medicare Other | Source: Ambulatory Visit | Attending: Gastroenterology | Admitting: Gastroenterology

## 2022-10-29 ENCOUNTER — Ambulatory Visit: Payer: Medicare Other | Admitting: Anesthesiology

## 2022-10-29 ENCOUNTER — Encounter: Payer: Self-pay | Admitting: Gastroenterology

## 2022-10-29 DIAGNOSIS — R1310 Dysphagia, unspecified: Secondary | ICD-10-CM | POA: Diagnosis not present

## 2022-10-29 DIAGNOSIS — K2970 Gastritis, unspecified, without bleeding: Secondary | ICD-10-CM | POA: Diagnosis not present

## 2022-10-29 DIAGNOSIS — E785 Hyperlipidemia, unspecified: Secondary | ICD-10-CM | POA: Insufficient documentation

## 2022-10-29 DIAGNOSIS — K222 Esophageal obstruction: Secondary | ICD-10-CM | POA: Diagnosis not present

## 2022-10-29 DIAGNOSIS — K449 Diaphragmatic hernia without obstruction or gangrene: Secondary | ICD-10-CM | POA: Diagnosis not present

## 2022-10-29 DIAGNOSIS — K2960 Other gastritis without bleeding: Secondary | ICD-10-CM | POA: Diagnosis not present

## 2022-10-29 DIAGNOSIS — K319 Disease of stomach and duodenum, unspecified: Secondary | ICD-10-CM | POA: Diagnosis not present

## 2022-10-29 DIAGNOSIS — I4891 Unspecified atrial fibrillation: Secondary | ICD-10-CM | POA: Insufficient documentation

## 2022-10-29 DIAGNOSIS — K2940 Chronic atrophic gastritis without bleeding: Secondary | ICD-10-CM | POA: Diagnosis not present

## 2022-10-29 DIAGNOSIS — I10 Essential (primary) hypertension: Secondary | ICD-10-CM | POA: Insufficient documentation

## 2022-10-29 DIAGNOSIS — K2980 Duodenitis without bleeding: Secondary | ICD-10-CM | POA: Diagnosis not present

## 2022-10-29 HISTORY — PX: ESOPHAGOGASTRODUODENOSCOPY: SHX5428

## 2022-10-29 SURGERY — EGD (ESOPHAGOGASTRODUODENOSCOPY)
Anesthesia: General

## 2022-10-29 MED ORDER — PROPOFOL 10 MG/ML IV BOLUS
INTRAVENOUS | Status: DC | PRN
  Administered 2022-10-29: 50 mg via INTRAVENOUS
  Administered 2022-10-29: 30 mg via INTRAVENOUS

## 2022-10-29 MED ORDER — PROPOFOL 1000 MG/100ML IV EMUL
INTRAVENOUS | Status: AC
  Filled 2022-10-29: qty 100

## 2022-10-29 MED ORDER — SODIUM CHLORIDE 0.9 % IV SOLN: INTRAVENOUS | Status: DC

## 2022-10-29 MED ORDER — PROPOFOL 500 MG/50ML IV EMUL
INTRAVENOUS | Status: DC | PRN
  Administered 2022-10-29: 125 ug/kg/min via INTRAVENOUS

## 2022-10-29 MED ORDER — GLYCOPYRROLATE 0.2 MG/ML IJ SOLN
INTRAMUSCULAR | Status: DC | PRN
  Administered 2022-10-29: .2 mg via INTRAVENOUS

## 2022-10-29 MED ORDER — LIDOCAINE HCL (CARDIAC) PF 100 MG/5ML IV SOSY
PREFILLED_SYRINGE | INTRAVENOUS | Status: DC | PRN
  Administered 2022-10-29: 50 mg via INTRAVENOUS

## 2022-10-29 NOTE — Transfer of Care (Signed)
Immediate Anesthesia Transfer of Care Note  Patient: Seth Tijerina Sr.  Procedure(s) Performed: ESOPHAGOGASTRODUODENOSCOPY (EGD)  Patient Location: PACU  Anesthesia Type:General  Level of Consciousness: awake  Airway & Oxygen Therapy: Patient Spontanous Breathing  Post-op Assessment: Report given to RN and Post -op Vital signs reviewed and stable  Post vital signs: Reviewed and stable  Last Vitals:  Vitals Value Taken Time  BP 86/50 10/29/22 1154  Temp 36.2 C 10/29/22 1146  Pulse 62 10/29/22 1154  Resp 17 10/29/22 1154  SpO2 98 % 10/29/22 1154  Vitals shown include unvalidated device data.  Last Pain:  Vitals:   10/29/22 1146  TempSrc: Temporal  PainSc: 0-No pain         Complications: No notable events documented.

## 2022-10-29 NOTE — Anesthesia Postprocedure Evaluation (Signed)
Anesthesia Post Note  Patient: Zygmund Passero Sr.  Procedure(s) Performed: ESOPHAGOGASTRODUODENOSCOPY (EGD)  Patient location during evaluation: Endoscopy Anesthesia Type: General Level of consciousness: awake and alert Pain management: pain level controlled Vital Signs Assessment: post-procedure vital signs reviewed and stable Respiratory status: spontaneous breathing, nonlabored ventilation and respiratory function stable Cardiovascular status: blood pressure returned to baseline and stable Postop Assessment: no apparent nausea or vomiting Anesthetic complications: no   No notable events documented.   Last Vitals:  Vitals:   10/29/22 1207 10/29/22 1212  BP: 102/62 114/65  Pulse: (!) 58 (!) 57  Resp: 18 15  Temp:    SpO2: 96% 96%    Last Pain:  Vitals:   10/29/22 1207  TempSrc:   PainSc: 0-No pain                 Alphonsus Sias

## 2022-10-29 NOTE — Interval H&P Note (Signed)
History and Physical Interval Note: Preprocedure H&P from 10/29/22  was reviewed and there was no interval change after seeing and examining the patient.  Written consent was obtained from the patient after discussion of risks, benefits, and alternatives. Patient has consented to proceed with Esophagogastroduodenoscopy with possible intervention   10/29/2022 11:15 AM  Seth Munch Sr.  has presented today for surgery, with the diagnosis of Dysphagia, unspecified type (R13.10) Esophageal obstruction due to food impaction (W38.937D,S28.F3XA) Schatzki's ring (K22.2).  The various methods of treatment have been discussed with the patient and family. After consideration of risks, benefits and other options for treatment, the patient has consented to  Procedure(s): ESOPHAGOGASTRODUODENOSCOPY (EGD) (N/A) as a surgical intervention.  The patient's history has been reviewed, patient examined, no change in status, stable for surgery.  I have reviewed the patient's chart and labs.  Questions were answered to the patient's satisfaction.     [name]

## 2022-10-29 NOTE — Anesthesia Preprocedure Evaluation (Addendum)
Anesthesia Evaluation  Patient identified by MRN, date of birth, ID band Patient awake    Reviewed: Allergy & Precautions, NPO status , Patient's Chart, lab work & pertinent test results  Airway Mallampati: IV  TM Distance: >3 FB Neck ROM: full    Dental  (+) Chipped, Caps   Pulmonary neg pulmonary ROS   Pulmonary exam normal        Cardiovascular hypertension, Normal cardiovascular exam+ dysrhythmias   Hx Afib - now in SR stable on meds    Neuro/Psych negative neurological ROS  negative psych ROS   GI/Hepatic negative GI ROS, Neg liver ROS,,,  Endo/Other  negative endocrine ROS    Renal/GU negative Renal ROS  negative genitourinary   Musculoskeletal   Abdominal   Peds  Hematology negative hematology ROS (+)   Anesthesia Other Findings Past Medical History: No date: AF (atrial fibrillation) (HCC) No date: Back pain No date: Hemorrhoids No date: Hyperlipidemia No date: Hypertension  Past Surgical History: No date: ABLATION 12/06/2014: COLONOSCOPY     Comment:  Dr. Oh 2011: EYE SURGERY 01/07/15: HERNIA REPAIR; Right     Comment:  right inguinal repair, medium ultra Pro mesh 2004: LAMINECTOMY     Reproductive/Obstetrics negative OB ROS                             Anesthesia Physical Anesthesia Plan  ASA: 3  Anesthesia Plan: General   Post-op Pain Management:    Induction: Intravenous  PONV Risk Score and Plan: Propofol infusion and TIVA  Airway Management Planned: Natural Airway and Nasal Cannula  Additional Equipment:   Intra-op Plan:   Post-operative Plan:   Informed Consent: I have reviewed the patients History and Physical, chart, labs and discussed the procedure including the risks, benefits and alternatives for the proposed anesthesia with the patient or authorized representative who has indicated his/her understanding and acceptance.     Dental Advisory  Given  Plan Discussed with: Anesthesiologist, CRNA and Surgeon  Anesthesia Plan Comments: (Patient consented for risks of anesthesia including but not limited to:  - adverse reactions to medications - risk of airway placement if required - damage to eyes, teeth, lips or other oral mucosa - nerve damage due to positioning  - sore throat or hoarseness - Damage to heart, brain, nerves, lungs, other parts of body or loss of life  Patient voiced understanding.)        Anesthesia Quick Evaluation

## 2022-10-29 NOTE — Op Note (Signed)
[facility] Gastroenter[facility] Patient Name: Seth Stewart Procedure Date: 10/29/2022 11:13 AM MRN: [id number] Account #: [id number] Date of Birth: [birth year] Admit Type: Outpatient Age: 82 Room: [facility] ENDO ROOM 1 Gender: Male Note Status: Finalized Instrument Name: Upper Endoscope [id number] Procedure:             Upper GI endoscopy Indications:           Dysphagia Providers:             Jaynie Collins DO, DO Referring MD:          Duanne Limerick, MD (Referring MD) Medicines:             Monitored Anesthesia Care Complications:         No immediate complications. Estimated blood loss:                         Minimal. Procedure:             Pre-Anesthesia Assessment:                        - Prior to the procedure, a History and Physical was                         performed, and patient medications and allergies were                         reviewed. The patient is competent. The risks and                         benefits of the procedure and the sedation options and                         risks were discussed with the patient. All questions                         were answered and informed consent was obtained.                         Patient identification and proposed procedure were                         verified by the physician, the nurse, the anesthetist                         and the technician in the endoscopy suite. Mental                         Status Examination: alert and oriented. Airway                         Examination: normal oropharyngeal airway and neck                         mobility. Respiratory Examination: clear to                         auscultation. CV Examination: RRR, no murmurs, no S3  or S4. Prophylactic Antibiotics: The patient does not                         require prophylactic antibiotics. Prior                         Anticoagulants: The patient has taken no anticoagulant                          or antiplatelet agents except for aspirin. ASA Grade                         Assessment: III - A patient with severe systemic                         disease. After reviewing the risks and benefits, the                         patient was deemed in satisfactory condition to                         undergo the procedure. The anesthesia plan was to use                         monitored anesthesia care (MAC). Immediately prior to                         administration of medications, the patient was                         re-assessed for adequacy to receive sedatives. The                         heart rate, respiratory rate, oxygen saturations,                         blood pressure, adequacy of pulmonary ventilation, and                         response to care were monitored throughout the                         procedure. The physical status of the patient was                         re-assessed after the procedure.                        After obtaining informed consent, the endoscope was                         passed under direct vision. Throughout the procedure,                         the patient's blood pressure, pulse, and oxygen                         saturations were monitored continuously. The Endoscope  was introduced through the mouth, and advanced to the                         second part of duodenum. The upper GI endoscopy was                         accomplished without difficulty. The patient tolerated                         the procedure well. Findings:      Localized mild inflammation characterized by erythema was found in the       first portion of the duodenum. Biopsies were taken with a cold forceps       for histology. Estimated blood loss was minimal.      Localized mild inflammation characterized by erythema was found in the       gastric antrum. Biopsies were taken with a cold forceps for histology.       Biopsies were taken with a cold  forceps for Helicobacter pylori testing.       Estimated blood loss was minimal.      A 3 cm hiatal hernia was present. Estimated blood loss: none.      The exam of the stomach was otherwise normal.      The Z-line was regular. Estimated blood loss: none.      Esophagogastric landmarks were identified: the gastroesophageal junction       was found at 38 cm from the incisors.      A moderate Schatzki ring was found at the gastroesophageal junction. A       guidewire was placed and the scope was withdrawn. Dilation was performed       with a Savary dilator with no resistance at 42 Fr and 45 Fr. The       dilation site was examined following endoscope reinsertion and showed       mild mucosal disruption. Estimated blood loss was minimal.      The exam of the esophagus was otherwise normal. Impression:            - Duodenitis. Biopsied.                        - Atrophic gastritis. Biopsied.                        - 3 cm hiatal hernia.                        - Z-line regular.                        - Esophagogastric landmarks identified.                        - Moderate Schatzki ring. Dilated. Recommendation:        - Patient has a contact number available for                         emergencies. The signs and symptoms of potential                         delayed complications were discussed with the patient.  Return to normal activities tomorrow. Written                         discharge instructions were provided to the patient.                        - Discharge patient to home.                        - Soft diet today.                        - Continue present medications.                        - No aspirin, ibuprofen, naproxen, or other                         non-steroidal anti-inflammatory drugs for 5 days.                        - Recommend starting proton pump inhibitor therapy if                         not already started                        - Await  pathology results.                        - Repeat upper endoscopy 2-4 weeks for retreatment.                        - Return to GI office as previously scheduled.                        - The findings and recommendations were discussed with                         the patient's family.                        - The findings and recommendations were discussed with                         the patient. Procedure Code(s):     --- Professional ---                        [phone], Esophagogastroduodenoscopy, flexible,                         transoral; with insertion of guide wire followed by                         passage of dilator(s) through esophagus over guide wire                        [id number], 59,51, Esophagogastroduodenoscopy, flexible,                         transoral; with biopsy, single or multiple Diagnosis Code(s):     ---  Professional ---                        K29.80, Duodenitis without bleeding                        K29.40, Chronic atrophic gastritis without bleeding                        K44.9, Diaphragmatic hernia without obstruction or                         gangrene                        K22.2, Esophageal obstruction                        R13.10, Dysphagia, unspecified CPT copyright 2022 [employer]. All rights reserved. The codes documented in this report are preliminary and upon coder review may  be revised to meet current compliance requirements. Attending Participation:      I personally performed the entire procedure. Elfredia Nevins, DO Jaynie Collins DO, DO 10/29/2022 11:45:59 AM This report has been signed electronically. Number of Addenda: 0 Note Initiated On: 10/29/2022 11:13 AM Estimated Blood Loss:  Estimated blood loss was minimal.      [facility]

## 2022-10-30 ENCOUNTER — Encounter: Payer: Self-pay | Admitting: Gastroenterology

## 2022-10-30 LAB — SURGICAL PATHOLOGY

## 2022-11-04 ENCOUNTER — Ambulatory Visit (INDEPENDENT_AMBULATORY_CARE_PROVIDER_SITE_OTHER): Payer: Medicare Other

## 2022-11-04 VITALS — Ht 75.0 in | Wt 213.0 lb

## 2022-11-04 DIAGNOSIS — Z0000 Encounter for general adult medical examination without abnormal findings: Secondary | ICD-10-CM | POA: Diagnosis not present

## 2022-11-04 NOTE — Patient Instructions (Signed)
Seth Stewart , Thank you for taking time to come for your Medicare Wellness Visit. I appreciate your ongoing commitment to your health goals. Please review the following plan we discussed and let me know if I can assist you in the future.   These are the goals we discussed:  Goals      DIET - EAT MORE FRUITS AND VEGETABLES        This is a list of the screening recommended for you and due dates:  Health Maintenance  Topic Date Due   Flu Shot  04/21/2022   COVID-19 Vaccine (6 - 2023-24 season) 05/22/2022   Medicare Annual Wellness Visit  11/05/2023   DTaP/Tdap/Td vaccine (2 - Td or Tdap) 11/10/2025   Pneumonia Vaccine  Completed   Zoster (Shingles) Vaccine  Completed   HPV Vaccine  Aged Out    Advanced directives: no  Conditions/risks identified: none  Next appointment: Follow up in one year for your annual wellness visit. 11/10/23 @ 10:15 am by phone  Preventive Care 65 Years and Older, Male  Preventive care refers to lifestyle choices and visits with your health care provider that can promote health and wellness. What does preventive care include? A yearly physical exam. This is also called an annual well check. Dental exams once or twice a year. Routine eye exams. Ask your health care provider how often you should have your eyes checked. Personal lifestyle choices, including: Daily care of your teeth and gums. Regular physical activity. Eating a healthy diet. Avoiding tobacco and drug use. Limiting alcohol use. Practicing safe sex. Taking low doses of aspirin every day. Taking vitamin and mineral supplements as recommended by your health care provider. What happens during an annual well check? The services and screenings done by your health care provider during your annual well check will depend on your age, overall health, lifestyle risk factors, and family history of disease. Counseling  Your health care provider may ask you questions about your: Alcohol use. Tobacco  use. Drug use. Emotional well-being. Home and relationship well-being. Sexual activity. Eating habits. History of falls. Memory and ability to understand (cognition). Work and work Statistician. Screening  You may have the following tests or measurements: Height, weight, and BMI. Blood pressure. Lipid and cholesterol levels. These may be checked every 5 years, or more frequently if you are over 20 years old. Skin check. Lung cancer screening. You may have this screening every year starting at age 83 if you have a 30-pack-year history of smoking and currently smoke or have quit within the past 15 years. Fecal occult blood test (FOBT) of the stool. You may have this test every year starting at age 83. Flexible sigmoidoscopy or colonoscopy. You may have a sigmoidoscopy every 5 years or a colonoscopy every 10 years starting at age 83. Prostate cancer screening. Recommendations will vary depending on your family history and other risks. Hepatitis C blood test. Hepatitis B blood test. Sexually transmitted disease (STD) testing. Diabetes screening. This is done by checking your blood sugar (glucose) after you have not eaten for a while (fasting). You may have this done every 1-3 years. Abdominal aortic aneurysm (AAA) screening. You may need this if you are a current or former smoker. Osteoporosis. You may be screened starting at age 83 if you are at high risk. Talk with your health care provider about your test results, treatment options, and if necessary, the need for more tests. Vaccines  Your health care provider may recommend certain vaccines, such  as: Influenza vaccine. This is recommended every year. Tetanus, diphtheria, and acellular pertussis (Tdap, Td) vaccine. You may need a Td booster every 10 years. Zoster vaccine. You may need this after age 15. Pneumococcal 13-valent conjugate (PCV13) vaccine. One dose is recommended after age 83. Pneumococcal polysaccharide (PPSV23) vaccine.  One dose is recommended after age 83. Talk to your health care provider about which screenings and vaccines you need and how often you need them. This information is not intended to replace advice given to you by your health care provider. Make sure you discuss any questions you have with your health care provider. Document Released: 10/04/2015 Document Revised: 05/27/2016 Document Reviewed: 07/09/2015 Elsevier Interactive Patient Education  2017 [place] Prevention in the Home Falls can cause injuries. They can happen to people of all ages. There are many things you can do to make your home safe and to help prevent falls. What can I do on the outside of my home? Regularly fix the edges of walkways and driveways and fix any cracks. Remove anything that might make you trip as you walk through a door, such as a raised step or threshold. Trim any bushes or trees on the path to your home. Use bright outdoor lighting. Clear any walking paths of anything that might make someone trip, such as rocks or tools. Regularly check to see if handrails are loose or broken. Make sure that both sides of any steps have handrails. Any raised decks and porches should have guardrails on the edges. Have any leaves, snow, or ice cleared regularly. Use sand or salt on walking paths during winter. Clean up any spills in your garage right away. This includes oil or grease spills. What can I do in the bathroom? Use night lights. Install grab bars by the toilet and in the tub and shower. Do not use towel bars as grab bars. Use non-skid mats or decals in the tub or shower. If you need to sit down in the shower, use a plastic, non-slip stool. Keep the floor dry. Clean up any water that spills on the floor as soon as it happens. Remove soap buildup in the tub or shower regularly. Attach bath mats securely with double-sided non-slip rug tape. Do not have throw rugs and other things on the floor that can make  you trip. What can I do in the bedroom? Use night lights. Make sure that you have a light by your bed that is easy to reach. Do not use any sheets or blankets that are too big for your bed. They should not hang down onto the floor. Have a firm chair that has side arms. You can use this for support while you get dressed. Do not have throw rugs and other things on the floor that can make you trip. What can I do in the kitchen? Clean up any spills right away. Avoid walking on wet floors. Keep items that you use a lot in easy-to-reach places. If you need to reach something above you, use a strong step stool that has a grab bar. Keep electrical cords out of the way. Do not use floor polish or wax that makes floors slippery. If you must use wax, use non-skid floor wax. Do not have throw rugs and other things on the floor that can make you trip. What can I do with my stairs? Do not leave any items on the stairs. Make sure that there are handrails on both sides of the stairs and use  them. Fix handrails that are broken or loose. Make sure that handrails are as long as the stairways. Check any carpeting to make sure that it is firmly attached to the stairs. Fix any carpet that is loose or worn. Avoid having throw rugs at the top or bottom of the stairs. If you do have throw rugs, attach them to the floor with carpet tape. Make sure that you have a light switch at the top of the stairs and the bottom of the stairs. If you do not have them, ask someone to add them for you. What else can I do to help prevent falls? Wear shoes that: Do not have high heels. Have rubber bottoms. Are comfortable and fit you well. Are closed at the toe. Do not wear sandals. If you use a stepladder: Make sure that it is fully opened. Do not climb a closed stepladder. Make sure that both sides of the stepladder are locked into place. Ask someone to hold it for you, if possible. Clearly mark and make sure that you can  see: Any grab bars or handrails. First and last steps. Where the edge of each step is. Use tools that help you move around (mobility aids) if they are needed. These include: Canes. Walkers. Scooters. Crutches. Turn on the lights when you go into a dark area. Replace any light bulbs as soon as they burn out. Set up your furniture so you have a clear path. Avoid moving your furniture around. If any of your floors are uneven, fix them. If there are any pets around you, be aware of where they are. Review your medicines with your doctor. Some medicines can make you feel dizzy. This can increase your chance of falling. Ask your doctor what other things that you can do to help prevent falls. This information is not intended to replace advice given to you by your health care provider. Make sure you discuss any questions you have with your health care provider. Document Released: 07/04/2009 Document Revised: 02/13/2016 Document Reviewed: 10/12/2014 Elsevier Interactive Patient Education  2017 [employer].

## 2022-11-04 NOTE — Progress Notes (Signed)
I connected with  Seth Munch Sr. on 11/04/22 by a audio enabled telemedicine application and verified that I am speaking with the correct person using two identifiers.  Patient Location: Home  Provider Location: Office/Clinic  I discussed the limitations of evaluation and management by telemedicine. The patient expressed understanding and agreed to proceed.  Subjective:   Seth Golia Sr. is a 82 y.o. male who presents for Medicare Annual/Subsequent preventive examination.  Review of Systems     Cardiac Risk Factors include: advanced age (>47mn, >>76women);male gender;dyslipidemia;hypertension     Objective:    There were no vitals filed for this visit. There is no height or weight on file to calculate BMI.     11/04/2022   11:35 AM 10/29/2022   10:37 AM 10/27/2021    8:18 AM 11/11/2019   12:30 PM 05/09/2019    1:55 AM  Advanced Directives  Does Patient Have a Medical Advance Directive? No Yes Yes Yes Yes  Type of AScientist, physiologicalof AChaunceyLiving will HArthurLiving will HJolietLiving will  Copy of [place]abin Chart?   No - copy requested  No - copy requested  Would patient like information on creating a medical advance directive? No - Patient declined        Current Medications (verified) Outpatient Encounter Medications as of 11/04/2022  Medication Sig   amiodarone (PACERONE) 100 MG tablet Take 100 mg by mouth daily.   Ascorbic Acid (VITAMIN C) 1000 MG tablet Take 1,000 mg by mouth daily.   aspirin 81 MG EC tablet Take 81 mg by mouth daily.   Flaxseed, Linseed, (FLAX SEEDS PO) Take 2 Scoops by mouth daily.   Lactobacillus (PROBIOTIC ACIDOPHILUS PO) Take 1 capsule by mouth.   metoprolol tartrate (LOPRESSOR) 25 MG tablet Take 25 mg by mouth 2 (two) times daily. Dr FUbaldo Glassing  Multiple Vitamins-Minerals (MULTIVITAMIN WITH MINERALS) tablet Take 1 tablet by mouth daily.   Omega-3  1000 MG CAPS Take 1 capsule by mouth 2 (two) times daily. otc   rosuvastatin (CRESTOR) 5 MG tablet Take 1 tablet (5 mg total) by mouth daily.   tadalafil (CIALIS) 20 MG tablet Take 20 mg by mouth daily as needed.   vitamin E 400 UNIT capsule Take 400 Units by mouth daily.   guaiFENesin-codeine (ROBITUSSIN AC) 100-10 MG/5ML syrup Take 5 mLs by mouth 3 (three) times daily as needed for cough. (Patient not taking: Reported on 11/04/2022)   [DISCONTINUED] amoxicillin-clavulanate (AUGMENTIN) 875-125 MG tablet Take 1 tablet by mouth 2 (two) times daily.   No facility-administered encounter medications on file as of 11/04/2022.    Allergies (verified) Patient has no known allergies.   History: Past Medical History:  Diagnosis Date   AF (atrial fibrillation) (HCC)    Back pain    Hemorrhoids    Hyperlipidemia    Hypertension    Past Surgical History:  Procedure Laterality Date   ABLATION     COLONOSCOPY  12/06/2014   Dr. OCandace Cruise  ESOPHAGOGASTRODUODENOSCOPY N/A 10/29/2022   Procedure: ESOPHAGOGASTRODUODENOSCOPY (EGD);  Surgeon: RAnnamaria Helling DO;  [facility]  Service: Gastroenterology;  Laterality: N/A;   EYE SURGERY  2011   HERNIA REPAIR Right 01/07/15   right inguinal repair, medium ultra Pro mesh   LAMINECTOMY  2004   Family History  Problem Relation Age of Onset   Cancer Mother    Heart disease Father    Social  History   Socioeconomic History   Marital status: Married    Spouse name: Reavis Tews   Number of children: 1   Years of education: 16   Highest education level: Bachelor's degree (e.g., BA, AB, BS)  Occupational History   Not on file  Tobacco Use   Smoking status: Never   Smokeless tobacco: Never  Vaping Use   Vaping Use: Never used  Substance and Sexual Activity   Alcohol use: Yes    Alcohol/week: 10.0 standard drinks of alcohol    Types: 10 Standard drinks or equivalent per week   Drug use: Never   Sexual activity: Yes    Partners: Female   Other Topics Concern   Not on file  Social History Narrative   Not on file   Social Determinants of Health   Financial Resource Strain: Low Risk  (11/04/2022)   Overall Financial Resource Strain (CARDIA)    Difficulty of Paying Living Expenses: Not hard at all  Food Insecurity: No Food Insecurity (11/04/2022)   Hunger Vital Sign    Worried About Running Out of Food in the Last Year: Never true    [place] in the Last Year: Never true  Transportation Needs: No Transportation Needs (11/04/2022)   PRAPARE - Hydrologist (Medical): No    Lack of Transportation (Non-Medical): No  Physical Activity: Insufficiently Active (11/04/2022)   Exercise Vital Sign    Days of Exercise per Week: 3 days    Minutes of Exercise per Session: 40 min  Stress: No Stress Concern Present (11/04/2022)   [place]    Feeling of Stress : Not at all  Social Connections: Moderately Isolated (11/04/2022)   Social Connection and Isolation Panel [NHANES]    Frequency of Communication with Friends and Family: More than three times a week    Frequency of Social Gatherings with Friends and Family: Once a week    Attends Religious Services: Never    Marine scientist or Organizations: No    Attends Music therapist: Never    Marital Status: Married    Tobacco Counseling Counseling given: Not Answered   Clinical Intake:  Pre-visit preparation completed: Yes  Pain : No/denies pain     Nutritional Risks: None Diabetes: No  How often do you need to have someone help you when you read instructions, pamphlets, or other written materials from your doctor or pharmacy?: 1 - Never  Diabetic?no  Interpreter Needed?: No  Information entered by :: Kirke Shaggy, LPN   Activities of Daily Living    11/04/2022   11:36 AM 11/03/2022    9:01 AM  In your present state of health, do you have any  difficulty performing the following activities:  Hearing? 0 0  Vision? 0 0  Difficulty concentrating or making decisions? 0 0  Walking or climbing stairs? 0 0  Dressing or bathing? 0 0  Doing errands, shopping? 0 0  Preparing Food and eating ? N N  Using the Toilet? N N  In the past six months, have you accidently leaked urine? N N  Do you have problems with loss of bowel control? N N  Managing your Medications? N N  Managing your Finances? N N  Housekeeping or managing your Housekeeping? N N    Patient Care Team: Juline Patch, MD as PCP - General (Family Medicine) Ubaldo Glassing Javier Docker, MD as Consulting Physician (Cardiology)  Indicate any recent Medical Services you may have received from other than [facility] providers in the past year (date may be approximate).     Assessment:   This is a routine wellness examination for Reyly.  Hearing/Vision screen Hearing Screening - Comments:: Wears aids Vision Screening - Comments:: Wears glasses- Dr.Woodard  Dietary issues and exercise activities discussed: Current Exercise Habits: Home exercise routine, Type of exercise: walking, Time (Minutes): 40, Frequency (Times/Week): 3, Weekly Exercise (Minutes/Week): 120, Intensity: Mild   Goals Addressed             This Visit's Progress    DIET - EAT MORE FRUITS AND VEGETABLES         Depression Screen    11/04/2022   11:34 AM 10/05/2022    4:01 PM 07/27/2022   10:31 AM 03/23/2022   10:44 AM 01/22/2022   11:02 AM 10/27/2021    8:17 AM 07/16/2021   10:10 AM  PHQ 2/9 Scores  PHQ - 2 Score 0 0 0 0 0 0 0  PHQ- 9 Score 0 0 0 0 0  0    Fall Risk    11/04/2022   11:35 AM 11/03/2022    9:01 AM 10/05/2022    4:01 PM 07/27/2022   10:31 AM 10/27/2021    8:19 AM  Fall Risk   Falls in the past year? 0 0 0 0 0  Number falls in past yr: 0 0 0 0 0  Injury with Fall? 0 0 0 0 0  Risk for fall due to : No Fall Risks  No Fall Risks No Fall Risks No Fall Risks  Follow up Falls prevention  discussed;Falls evaluation completed  Falls evaluation completed Falls evaluation completed Falls prevention discussed    FALL RISK PREVENTION PERTAINING TO THE HOME:  Any stairs in or around the home? No  If so, are there any without handrails? No  Home free of loose throw rugs in walkways, pet beds, electrical cords, etc? Yes  Adequate lighting in your home to reduce risk of falls? Yes   ASSISTIVE DEVICES UTILIZED TO PREVENT FALLS:  Life alert? No  Use of a cane, walker or w/c? No  Grab bars in the bathroom? Yes  Shower chair or bench in shower? Yes  Elevated toilet seat or a handicapped toilet? No    Cognitive Function:        11/04/2022   11:36 AM  6CIT Screen  What Year? 0 points  What month? 0 points  What time? 0 points  Count back from 20 0 points  Months in reverse 0 points  Repeat phrase 0 points  Total Score 0 points    Immunizations Immunization History  Administered Date(s) Administered   Fluad Quad(high Dose 65+) 05/16/2019, 06/10/2021   Influenza, High Dose Seasonal PF 06/21/2017   Influenza-Unspecified 06/16/2016, 06/21/2017, 05/25/2020   PFIZER(Purple Top)SARS-COV-2 Vaccination 09/25/2019, 10/16/2019, 05/25/2020, 01/02/2021   Pfizer Covid-19 Vaccine Bivalent Booster 77yr & up 08/07/2021   Pneumococcal Conjugate-13 09/21/2013   Pneumococcal Polysaccharide-23 06/27/2015   Tdap 11/11/2015   Zoster Recombinat (Shingrix) 06/13/2018, 08/12/2018   Zoster, Live 09/22/2007    TDAP status: Up to date  Flu Vaccine status: Up to date  Pneumococcal vaccine status: Up to date  Covid-19 vaccine status: Completed vaccines  Qualifies for Shingles Vaccine? Yes   Zostavax completed Yes   Shingrix Completed?: Yes  Screening Tests Health Maintenance  Topic Date Due   INFLUENZA VACCINE  04/21/2022   COVID-19 Vaccine (6 - 2023-24  season) 05/22/2022   Medicare Annual Wellness (AWV)  11/05/2023   DTaP/Tdap/Td (2 - Td or Tdap) 11/10/2025   Pneumonia Vaccine  58+ Years old  Completed   Zoster Vaccines- Shingrix  Completed   HPV VACCINES  Aged Out    Health Maintenance  Health Maintenance Due  Topic Date Due   INFLUENZA VACCINE  04/21/2022   COVID-19 Vaccine (6 - 2023-24 season) 05/22/2022    Colorectal cancer screening: No longer required.   Lung Cancer Screening: (Low Dose CT Chest recommended if Age 58-80 years, 30 pack-year currently smoking OR have quit w/in 15years.) does not qualify.   Additional Screening:  Hepatitis C Screening: does not qualify; Completed no  Vision Screening: Recommended annual ophthalmology exams for early detection of glaucoma and other disorders of the eye. Is the patient up to date with their annual eye exam?  Yes  Who is the provider or what is the name of the office in which the patient attends annual eye exams? Dr.Woodard If pt is not established with a provider, would they like to be referred to a provider to establish care? No .   Dental Screening: Recommended annual dental exams for proper oral hygiene  Community Resource Referral / Chronic Care Management: CRR required this visit?  No   CCM required this visit?  No      Plan:     I have personally reviewed and noted the following in the patient's chart:   Medical and social history Use of alcohol, tobacco or illicit drugs  Current medications and supplements including opioid prescriptions. Patient is not currently taking opioid prescriptions. Functional ability and status Nutritional status Physical activity Advanced directives List of other physicians Hospitalizations, surgeries, and ER visits in previous 12 months Vitals Screenings to include cognitive, depression, and falls Referrals and appointments  In addition, I have reviewed and discussed with patient certain preventive protocols, quality metrics, and best practice recommendations. A written personalized care plan for preventive services as well as general preventive health  recommendations were provided to patient.     Dionisio David, LPN   [id number]   Nurse Notes: none

## 2022-11-11 DIAGNOSIS — D485 Neoplasm of uncertain behavior of skin: Secondary | ICD-10-CM | POA: Diagnosis not present

## 2022-11-11 DIAGNOSIS — L570 Actinic keratosis: Secondary | ICD-10-CM | POA: Diagnosis not present

## 2022-11-11 DIAGNOSIS — D2262 Melanocytic nevi of left upper limb, including shoulder: Secondary | ICD-10-CM | POA: Diagnosis not present

## 2022-11-11 DIAGNOSIS — Z85828 Personal history of other malignant neoplasm of skin: Secondary | ICD-10-CM | POA: Diagnosis not present

## 2022-11-11 DIAGNOSIS — L820 Inflamed seborrheic keratosis: Secondary | ICD-10-CM | POA: Diagnosis not present

## 2022-11-11 DIAGNOSIS — D2272 Melanocytic nevi of left lower limb, including hip: Secondary | ICD-10-CM | POA: Diagnosis not present

## 2022-11-11 DIAGNOSIS — L821 Other seborrheic keratosis: Secondary | ICD-10-CM | POA: Diagnosis not present

## 2022-11-11 DIAGNOSIS — Z08 Encounter for follow-up examination after completed treatment for malignant neoplasm: Secondary | ICD-10-CM | POA: Diagnosis not present

## 2022-11-11 DIAGNOSIS — D2271 Melanocytic nevi of right lower limb, including hip: Secondary | ICD-10-CM | POA: Diagnosis not present

## 2022-11-11 DIAGNOSIS — X32XXXA Exposure to sunlight, initial encounter: Secondary | ICD-10-CM | POA: Diagnosis not present

## 2022-11-11 DIAGNOSIS — D2261 Melanocytic nevi of right upper limb, including shoulder: Secondary | ICD-10-CM | POA: Diagnosis not present

## 2022-11-11 DIAGNOSIS — D225 Melanocytic nevi of trunk: Secondary | ICD-10-CM | POA: Diagnosis not present

## 2022-12-03 ENCOUNTER — Encounter: Payer: Self-pay | Admitting: Gastroenterology

## 2022-12-04 ENCOUNTER — Encounter
Admission: RE | Disposition: A | Discharge status: Home / Self Care | Payer: Self-pay | Source: Home / Self Care | Attending: Gastroenterology

## 2022-12-04 ENCOUNTER — Encounter: Payer: Self-pay | Admitting: Gastroenterology

## 2022-12-04 ENCOUNTER — Ambulatory Visit: Payer: Medicare Other | Admitting: Certified Registered Nurse Anesthetist

## 2022-12-04 ENCOUNTER — Other Ambulatory Visit: Payer: Self-pay

## 2022-12-04 ENCOUNTER — Ambulatory Visit
Admission: RE | Admit: 2022-12-04 | Discharge: 2022-12-04 | Disposition: A | Discharge status: Home / Self Care | Payer: Medicare Other | Attending: Gastroenterology | Admitting: Gastroenterology

## 2022-12-04 DIAGNOSIS — K449 Diaphragmatic hernia without obstruction or gangrene: Secondary | ICD-10-CM | POA: Diagnosis not present

## 2022-12-04 DIAGNOSIS — E785 Hyperlipidemia, unspecified: Secondary | ICD-10-CM | POA: Insufficient documentation

## 2022-12-04 DIAGNOSIS — R1310 Dysphagia, unspecified: Secondary | ICD-10-CM | POA: Diagnosis not present

## 2022-12-04 DIAGNOSIS — K222 Esophageal obstruction: Secondary | ICD-10-CM | POA: Diagnosis not present

## 2022-12-04 DIAGNOSIS — K2980 Duodenitis without bleeding: Secondary | ICD-10-CM | POA: Diagnosis not present

## 2022-12-04 DIAGNOSIS — I4891 Unspecified atrial fibrillation: Secondary | ICD-10-CM | POA: Diagnosis not present

## 2022-12-04 DIAGNOSIS — I10 Essential (primary) hypertension: Secondary | ICD-10-CM | POA: Insufficient documentation

## 2022-12-04 HISTORY — DX: Dysphagia, unspecified: R13.10

## 2022-12-04 HISTORY — PX: ESOPHAGOGASTRODUODENOSCOPY (EGD) WITH PROPOFOL: SHX5813

## 2022-12-04 HISTORY — DX: Cardiac arrhythmia, unspecified: I49.9

## 2022-12-04 SURGERY — ESOPHAGOGASTRODUODENOSCOPY (EGD) WITH PROPOFOL
Anesthesia: General

## 2022-12-04 MED ORDER — PROPOFOL 10 MG/ML IV BOLUS
INTRAVENOUS | Status: DC | PRN
  Administered 2022-12-04: 70 mg via INTRAVENOUS

## 2022-12-04 MED ORDER — PROPOFOL 500 MG/50ML IV EMUL
INTRAVENOUS | Status: DC | PRN
  Administered 2022-12-04: 150 ug/kg/min via INTRAVENOUS

## 2022-12-04 MED ORDER — LIDOCAINE HCL (CARDIAC) PF 100 MG/5ML IV SOSY
PREFILLED_SYRINGE | INTRAVENOUS | Status: DC | PRN
  Administered 2022-12-04: 50 mg via INTRAVENOUS

## 2022-12-04 MED ORDER — EPHEDRINE SULFATE (PRESSORS) 50 MG/ML IJ SOLN
INTRAMUSCULAR | Status: DC | PRN
  Administered 2022-12-04: 5 mg via INTRAVENOUS

## 2022-12-04 MED ORDER — SODIUM CHLORIDE 0.9 % IV SOLN: INTRAVENOUS | Status: DC

## 2022-12-04 NOTE — Anesthesia Procedure Notes (Signed)
Date/Time: 12/04/2022 9:40 AM  Performed by: Johnna Acosta, CRNAPre-anesthesia Checklist: Patient identified, Emergency Drugs available, Suction available, Patient being monitored and Timeout performed Patient Re-evaluated:Patient Re-evaluated prior to induction Oxygen Delivery Method: Nasal cannula Preoxygenation: Pre-oxygenation with 100% oxygen Induction Type: IV induction

## 2022-12-04 NOTE — Anesthesia Postprocedure Evaluation (Signed)
Anesthesia Post Note  Patient: Seth Gikas Sr.  Procedure(s) Performed: ESOPHAGOGASTRODUODENOSCOPY (EGD) WITH PROPOFOL  Patient location during evaluation: Endoscopy Anesthesia Type: General Level of consciousness: awake and alert Pain management: pain level controlled Vital Signs Assessment: post-procedure vital signs reviewed and stable Respiratory status: spontaneous breathing, nonlabored ventilation, respiratory function stable and patient connected to nasal cannula oxygen Cardiovascular status: blood pressure returned to baseline and stable Postop Assessment: no apparent nausea or vomiting Anesthetic complications: no   No notable events documented.   Last Vitals:  Vitals:   12/04/22 1006 12/04/22 1009  BP:  (!) 122/99  Pulse:  62  Resp:  12  Temp: (!) 36.1 C   SpO2:  96%    Last Pain:  Vitals:   12/04/22 1006  TempSrc: Temporal  PainSc: Asleep                 Arita Miss

## 2022-12-04 NOTE — H&P (Signed)
Pre-Procedure H&P   Patient ID: Seth Hoerig Sr. is a 82 y.o. male.  Gastroenterology Provider: [name], DO  PCP: Juline Patch, MD  Date: 12/04/2022  HPI Mr. Seth Morten Sr. is a 82 y.o. male who presents today for Esophagogastroduodenoscopy for Dysphagia .  Pt with dysphagia. EGD 10/29/22 with 3 cm HH. GEJ 38 cm. Schatski's ring dilated at 42, 45 french savary with disruption noted. Biopsies negative for HP and GIM.  Has been maintained on protonix. Swallowing has been going "much better." No further sticking.  Repeat egd today    Past Medical History:  Diagnosis Date   AF (atrial fibrillation) (HCC)    Back pain    Dysphagia    Dysrhythmia    Hemorrhoids    Hyperlipidemia    Hypertension     Past Surgical History:  Procedure Laterality Date   ABLATION     BACK SURGERY     COLONOSCOPY  12/06/2014   Dr. Candace Cruise   ESOPHAGOGASTRODUODENOSCOPY N/A 10/29/2022   Procedure: ESOPHAGOGASTRODUODENOSCOPY (EGD);  Surgeon: [name], DO;  Location: [facility] ENDOSCOPY;  Service: Gastroenterology;  Laterality: N/A;   ESOPHAGOGASTRODUODENOSCOPY     EYE SURGERY  09/21/2009   HERNIA REPAIR Right 01/07/2015   right inguinal repair, medium ultra Pro mesh   LAMINECTOMY  09/21/2002    Family History No h/o GI disease or malignancy  Review of Systems  Constitutional:  Negative for activity change, appetite change, chills, diaphoresis, fatigue, fever and unexpected weight change.  HENT:  Positive for trouble swallowing. Negative for voice change.   Respiratory:  Negative for shortness of breath and wheezing.   Cardiovascular:  Negative for chest pain, palpitations and leg swelling.  Gastrointestinal:  Negative for abdominal distention, abdominal pain, anal bleeding, blood in stool, constipation, diarrhea, nausea and vomiting.  Musculoskeletal:  Negative for arthralgias and myalgias.  Skin:  Negative for color change and pallor.  Neurological:   Negative for dizziness, syncope and weakness.  Psychiatric/Behavioral:  Negative for confusion. The patient is not nervous/anxious.   All other systems reviewed and are negative.    Medications No current facility-administered medications on file prior to encounter.   Current Outpatient Medications on File Prior to Encounter  Medication Sig Dispense Refill   amiodarone (PACERONE) 100 MG tablet Take 100 mg by mouth daily.     aspirin 81 MG EC tablet Take 81 mg by mouth daily.     Lactobacillus (PROBIOTIC ACIDOPHILUS PO) Take 1 capsule by mouth.     metoprolol tartrate (LOPRESSOR) 25 MG tablet Take 25 mg by mouth 2 (two) times daily. Dr Ubaldo Glassing  0   rosuvastatin (CRESTOR) 5 MG tablet Take 1 tablet (5 mg total) by mouth daily. 90 tablet 1   Ascorbic Acid (VITAMIN C) 1000 MG tablet Take 1,000 mg by mouth daily.     Flaxseed, Linseed, (FLAX SEEDS PO) Take 2 Scoops by mouth daily.     guaiFENesin-codeine (ROBITUSSIN AC) 100-10 MG/5ML syrup Take 5 mLs by mouth 3 (three) times daily as needed for cough. (Patient not taking: Reported on 11/04/2022) 118 mL 0   Multiple Vitamins-Minerals (MULTIVITAMIN WITH MINERALS) tablet Take 1 tablet by mouth daily.     Omega-3 1000 MG CAPS Take 1 capsule by mouth 2 (two) times daily. otc     tadalafil (CIALIS) 20 MG tablet Take 20 mg by mouth daily as needed.     vitamin E 400 UNIT capsule Take 400 Units by mouth daily.  Pertinent medications related to GI and procedure were reviewed by me with the patient prior to the procedure   Current Facility-Administered Medications:    0.9 %  sodium chloride infusion, , Intravenous, Continuous, Dali, Simonian, DO, Last Rate: 20 mL/hr at 12/04/22 [id number], Restarted at 12/04/22 [id number]  sodium chloride 20 mL/hr at 12/04/22 0908       No Known Allergies Allergies were reviewed by me prior to the procedure  Objective   Body mass index is 26.47 kg/m. Vitals:   12/04/22 0854  BP: (!) 149/62  Pulse: (!) 52   Resp: 20  Temp: (!) 96.2 F (35.7 C)  TempSrc: Temporal  SpO2: 100%  Weight: 96.1 kg  Height: 6\' 3"  (1.905 m)     Physical Exam Vitals and nursing note reviewed.  Constitutional:      General: He is not in acute distress.    Appearance: Normal appearance. He is not ill-appearing, toxic-appearing or diaphoretic.  HENT:     Head: Normocephalic and atraumatic.     Nose: Nose normal.     Mouth/Throat:     Mouth: Mucous membranes are moist.     Pharynx: Oropharynx is clear.  Eyes:     General: No scleral icterus.    Extraocular Movements: Extraocular movements intact.  Cardiovascular:     Rate and Rhythm: Regular rhythm. Bradycardia present.     Heart sounds: Normal heart sounds. No murmur heard.    No friction rub. No gallop.  Pulmonary:     Effort: Pulmonary effort is normal. No respiratory distress.     Breath sounds: Normal breath sounds. No wheezing, rhonchi or rales.  Abdominal:     General: Bowel sounds are normal. There is no distension.     Palpations: Abdomen is soft.     Tenderness: There is no abdominal tenderness. There is no guarding or rebound.  Musculoskeletal:     Cervical back: Neck supple.     Right lower leg: No edema.     Left lower leg: No edema.  Skin:    General: Skin is warm and dry.     Coloration: Skin is not jaundiced or pale.  Neurological:     General: No focal deficit present.     Mental Status: He is alert and oriented to person, place, and time. Mental status is at baseline.  Psychiatric:        Mood and Affect: Mood normal.        Behavior: Behavior normal.        Thought Content: Thought content normal.        Judgment: Judgment normal.      Assessment:  Seth Stewart. is a 82 y.o. male  who presents today for Esophagogastroduodenoscopy for Dysphagia .  Plan:  Esophagogastroduodenoscopy with possible intervention today  Esophagogastroduodenoscopy with possible biopsy, control of bleeding, polypectomy, and  interventions as necessary has been discussed with the patient/patient representative. Informed consent was obtained from the patient/patient representative after explaining the indication, nature, and risks of the procedure including but not limited to death, bleeding, perforation, missed neoplasm/lesions, cardiorespiratory compromise, and reaction to medications. Opportunity for questions was given and appropriate answers were provided. Patient/patient representative has verbalized understanding is amenable to undergoing the procedure.   [name], DO  [facility] Gastroenterology  Portions of the record may have been created with voice recognition software. Occasional wrong-word or 'sound-a-like' substitutions may have occurred due to the inherent limitations of voice recognition software.  Read the chart carefully and recognize, using context, where substitutions may have occurred.

## 2022-12-04 NOTE — Anesthesia Preprocedure Evaluation (Signed)
Anesthesia Evaluation  Patient identified by MRN, date of birth, ID band Patient awake    Reviewed: Allergy & Precautions, NPO status , Patient's Chart, lab work & pertinent test results  History of Anesthesia Complications Negative for: history of anesthetic complications  Airway Mallampati: II  TM Distance: >3 FB Neck ROM: Full    Dental  (+) Chipped   Pulmonary neg pulmonary ROS, neg sleep apnea, neg COPD, Patient abstained from smoking.Not current smoker   Pulmonary exam normal breath sounds clear to auscultation       Cardiovascular Exercise Tolerance: Good METShypertension, (-) CAD and (-) Past MI + dysrhythmias Atrial Fibrillation  Rhythm:Regular Rate:Normal - Systolic murmurs    Neuro/Psych negative neurological ROS  negative psych ROS   GI/Hepatic ,neg GERD  ,,(+)     substance abuse  alcohol use  Endo/Other  neg diabetes    Renal/GU negative Renal ROS     Musculoskeletal   Abdominal   Peds  Hematology   Anesthesia Other Findings Past Medical History: No date: AF (atrial fibrillation) (HCC) No date: Back pain No date: Dysphagia No date: Dysrhythmia No date: Hemorrhoids No date: Hyperlipidemia No date: Hypertension  Reproductive/Obstetrics                             Anesthesia Physical Anesthesia Plan  ASA: 3  Anesthesia Plan: General   Post-op Pain Management: Minimal or no pain anticipated   Induction: Intravenous  PONV Risk Score and Plan: 2 and Propofol infusion, TIVA and Ondansetron  Airway Management Planned: Nasal Cannula  Additional Equipment: None  Intra-op Plan:   Post-operative Plan:   Informed Consent: I have reviewed the patients History and Physical, chart, labs and discussed the procedure including the risks, benefits and alternatives for the proposed anesthesia with the patient or authorized representative who has indicated his/her  understanding and acceptance.     Dental advisory given  Plan Discussed with: CRNA and Surgeon  Anesthesia Plan Comments: (Discussed risks of anesthesia with patient, including possibility of difficulty with spontaneous ventilation under anesthesia necessitating airway intervention, PONV, and rare risks such as cardiac or respiratory or neurological events, and allergic reactions. Discussed the role of CRNA in patient's perioperative care. Patient understands.)       Anesthesia Quick Evaluation

## 2022-12-04 NOTE — Transfer of Care (Signed)
Immediate Anesthesia Transfer of Care Note  Patient: Seth Marra Sr.  Procedure(s) Performed: ESOPHAGOGASTRODUODENOSCOPY (EGD) WITH PROPOFOL  Patient Location: Endoscopy Unit  Anesthesia Type:General  Level of Consciousness: awake and drowsy  Airway & Oxygen Therapy: Patient Spontanous Breathing  Post-op Assessment: Report given to RN and Post -op Vital signs reviewed and stable  Post vital signs: Reviewed and stable  Last Vitals:  Vitals Value Taken Time  BP 122/99 12/04/22 1009  Temp 36.1 C 12/04/22 1006  Pulse 54 12/04/22 1010  Resp 19 12/04/22 1010  SpO2 95 % 12/04/22 1010  Vitals shown include unvalidated device data.  Last Pain:  Vitals:   12/04/22 1006  TempSrc: Temporal  PainSc: Asleep         Complications: No notable events documented.

## 2022-12-04 NOTE — Interval H&P Note (Signed)
History and Physical Interval Note: Preprocedure H&P from 12/04/22  was reviewed and there was no interval change after seeing and examining the patient.  Written consent was obtained from the patient after discussion of risks, benefits, and alternatives. Patient has consented to proceed with Esophagogastroduodenoscopy with possible intervention   12/04/2022 9:39 AM  Seth Munch Sr.  has presented today for surgery, with the diagnosis of Schatzki's ring - Esophageal dysphagia.  The various methods of treatment have been discussed with the patient and family. After consideration of risks, benefits and other options for treatment, the patient has consented to  Procedure(s): ESOPHAGOGASTRODUODENOSCOPY (EGD) WITH PROPOFOL (N/A) as a surgical intervention.  The patient's history has been reviewed, patient examined, no change in status, stable for surgery.  I have reviewed the patient's chart and labs.  Questions were answered to the patient's satisfaction.     [name]

## 2022-12-04 NOTE — Op Note (Signed)
[facility] Gastroenterology Patient Name: Seth Stewart Procedure Date: 12/04/2022 9:20 AM MRN: [id number] Account #: [id number] Date of Birth: June 04, 1941 Admit Type: Outpatient Age: 82 Room: [facility] ENDO ROOM 1 Gender: Male Note Status: Finalized Instrument Name: Upper Endoscope [id number] Procedure:             Upper GI endoscopy Indications:           Dysphagia Providers:             Jaynie Collins DO, DO Medicines:             Monitored Anesthesia Care Complications:         No immediate complications. Estimated blood loss:                         Minimal. Procedure:             Pre-Anesthesia Assessment:                        - Prior to the procedure, a History and Physical was                         performed, and patient medications and allergies were                         reviewed. The patient is competent. The risks and                         benefits of the procedure and the sedation options and                         risks were discussed with the patient. All questions                         were answered and informed consent was obtained.                         Patient identification and proposed procedure were                         verified by the physician, the nurse, the anesthetist                         and the technician in the endoscopy suite. Mental                         Status Examination: alert and oriented. Airway                         Examination: normal oropharyngeal airway and neck                         mobility. Respiratory Examination: clear to                         auscultation. CV Examination: RRR, no murmurs, no S3                         or S4. Prophylactic Antibiotics: The patient does not  require prophylactic antibiotics. Prior                         Anticoagulants: The patient has taken no anticoagulant                         or antiplatelet agents. ASA Grade Assessment: III - A                          patient with severe systemic disease. After reviewing                         the risks and benefits, the patient was deemed in                         satisfactory condition to undergo the procedure. The                         anesthesia plan was to use monitored anesthesia care                         (MAC). Immediately prior to administration of                         medications, the patient was re-assessed for adequacy                         to receive sedatives. The heart rate, respiratory                         rate, oxygen saturations, blood pressure, adequacy of                         pulmonary ventilation, and response to care were                         monitored throughout the procedure. The physical                         status of the patient was re-assessed after the                         procedure.                        After obtaining informed consent, the endoscope was                         passed under direct vision. Throughout the procedure,                         the patient's blood pressure, pulse, and oxygen                         saturations were monitored continuously. The Endoscope                         was introduced through the mouth, and advanced to the  second part of duodenum. The upper GI endoscopy was                         accomplished without difficulty. The patient tolerated                         the procedure well. Findings:      Localized mild inflammation characterized by erythema was found in the       duodenal bulb. Estimated blood loss: none.      The exam of the duodenum was otherwise normal.      A small hiatal hernia was present. Estimated blood loss: none.      The exam of the stomach was otherwise normal.      The Z-line was regular. Estimated blood loss: none.      Esophagogastric landmarks were identified: the gastroesophageal junction       was found at 40 cm from the  incisors.      A widely patent Schatzki ring was found at the gastroesophageal       junction. A guidewire was placed and the scope was withdrawn. Dilation       was performed with a Savary dilator with no resistance at 45 Fr and 48       Fr. The dilation site was examined following endoscope reinsertion and       showed mild mucosal disruption. This was biopsied with a cold forceps       for further schatski ring disruption. no specimen taken. Estimated blood       loss was minimal.      The exam of the esophagus was otherwise normal. Impression:            - Duodenitis.                        - Small hiatal hernia.                        - Z-line regular.                        - Esophagogastric landmarks identified.                        - Widely patent Schatzki ring. Dilated. Biopsied. Recommendation:        - Patient has a contact number available for                         emergencies. The signs and symptoms of potential                         delayed complications were discussed with the patient.                         Return to normal activities tomorrow. Written                         discharge instructions were provided to the patient.                        - Discharge patient to home.                        -  Resume previous diet.                        - Continue present medications.                        - No aspirin, ibuprofen, naproxen, or other                         non-steroidal anti-inflammatory drugs for 5 days after                         polyp removal.                        - Repeat upper endoscopy PRN for retreatment.                        - Return to GI clinic PRN.                        - The findings and recommendations were discussed with                         the patient. Procedure Code(s):     --- Professional ---                        [phone], Esophagogastroduodenoscopy, flexible,                         transoral; with insertion of guide wire  followed by                         passage of dilator(s) through esophagus over guide wire                        [id number], 59,51, Esophagogastroduodenoscopy, flexible,                         transoral; with biopsy, single or multiple Diagnosis Code(s):     --- Professional ---                        K29.80, Duodenitis without bleeding                        K44.9, Diaphragmatic hernia without obstruction or                         gangrene                        K22.2, Esophageal obstruction                        R13.10, Dysphagia, unspecified CPT copyright 2022 [employer]. All rights reserved. The codes documented in this report are preliminary and upon coder review may  be revised to meet current compliance requirements. Attending Participation:      I personally performed the entire procedure. Elfredia Nevins, DO Jaynie Collins DO, DO 12/04/2022 10:09:34 AM This report has been signed electronically. Number of Addenda: 0 Note Initiated On: 12/04/2022  9:20 AM Estimated Blood Loss:  Estimated blood loss was minimal.      [facility]

## 2022-12-07 ENCOUNTER — Encounter: Payer: Self-pay | Admitting: Gastroenterology

## 2023-01-05 ENCOUNTER — Encounter: Payer: Self-pay | Admitting: Cardiovascular Disease

## 2023-01-05 ENCOUNTER — Ambulatory Visit: Payer: Medicare Other | Attending: Cardiovascular Disease | Admitting: Cardiovascular Disease

## 2023-01-05 VITALS — BP 158/72 | HR 56 | Ht 75.0 in | Wt 215.5 lb

## 2023-01-05 DIAGNOSIS — I480 Paroxysmal atrial fibrillation: Secondary | ICD-10-CM

## 2023-01-05 DIAGNOSIS — I2510 Atherosclerotic heart disease of native coronary artery without angina pectoris: Secondary | ICD-10-CM

## 2023-01-05 DIAGNOSIS — E785 Hyperlipidemia, unspecified: Secondary | ICD-10-CM

## 2023-01-05 NOTE — Progress Notes (Signed)
Cardiology Office Note   Date:  01/06/2023   ID:  Seth Clinton Sr., DOB [birth year], MRN [id number]  PCP:  Duanne Limerick, MD  Cardiologist:   Lorine Bears, MD   Chief Complaint  Patient presents with   New Patient (Initial Visit)    Afib no complaints today. Meds reviewed verbally with pt.      History of Present Illness: Seth Lichtenstein Sr. is a 82 y.o. male who presents to establish cardiovascular care.  He was previously followed by Dr. Lady Gary. He has known history of paroxysmal atrial fibrillation currently maintaining in sinus rhythm with low-dose amiodarone, hyperlipidemia and mild nonobstructive coronary artery disease on previous cardiac CTA in April 2023.  His calcium score was 336 with moderate proximal RCA stenosis that was not significant by FFR.  He has prolonged history of atrial flutter and atrial fibrillation.  I reviewed his previous notes.  He was found to have atrial flutter in 2013.  He was referred to Dr. Christin Fudge at [facility] and underwent atrial flutter ablation but it seems that he went into atrial fibrillation after that that required cardioversions.  Ultimately, he was placed on low-dose amiodarone.  He was briefly anticoagulated around his ablation but has not been on anticoagulation in the last few years. He has no striae of diabetes, stroke or congestive heart failure. He had an abnormal nuclear stress test last year showing inferior wall ischemia.  However, he underwent cardiac CTA which showed mildly elevated calcium score with moderate proximal RCA stenosis that was not significant by FFR.  He has been doing well and denies chest pain, shortness of breath or palpitations.  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  He likes to travel.  Past Medical History:  Diagnosis Date   AF (atrial fibrillation)    Back pain    Dysphagia    Dysrhythmia    Hemorrhoids    Hyperlipidemia    Hypertension     Past Surgical History:  Procedure  Laterality Date   ABLATION     BACK SURGERY     COLONOSCOPY  12/06/2014   [name]   ESOPHAGOGASTRODUODENOSCOPY N/A 10/29/2022   Procedure: ESOPHAGOGASTRODUODENOSCOPY (EGD);  Surgeon: Jaynie Collins, DO;  Location: [facility] ENDOSCOPY;  Service: Gastroenterology;  Laterality: N/A;   ESOPHAGOGASTRODUODENOSCOPY     ESOPHAGOGASTRODUODENOSCOPY (EGD) WITH PROPOFOL N/A 12/04/2022   Procedure: ESOPHAGOGASTRODUODENOSCOPY (EGD) WITH PROPOFOL;  Surgeon: Jaynie Collins, DO;  Location: [facility] ENDOSCOPY;  Service: Gastroenterology;  Laterality: N/A;   EYE SURGERY  09/21/2009   HERNIA REPAIR Right 01/07/2015   right inguinal repair, medium ultra Pro mesh   LAMINECTOMY  09/21/2002     Current Outpatient Medications  Medication Sig Dispense Refill   amiodarone (PACERONE) 100 MG tablet Take 100 mg by mouth daily.     Ascorbic Acid (VITAMIN C) 1000 MG tablet Take 1,000 mg by mouth daily.     aspirin 81 MG EC tablet Take 81 mg by mouth daily.     Flaxseed, Linseed, (FLAX SEEDS PO) Take 2 Scoops by mouth daily.     Lactobacillus (PROBIOTIC ACIDOPHILUS PO) Take 1 capsule by mouth.     metoprolol tartrate (LOPRESSOR) 25 MG tablet Take 25 mg by mouth 2 (two) times daily. Dr Lady Gary  0   Multiple Vitamins-Minerals (MULTIVITAMIN WITH MINERALS) tablet Take 1 tablet by mouth daily.     Omega-3 1000 MG CAPS Take 1 capsule by mouth 2 (two) times daily. otc  pantoprazole (PROTONIX) 40 MG tablet Take 40 mg by mouth daily.     rosuvastatin (CRESTOR) 5 MG tablet Take 1 tablet (5 mg total) by mouth daily. 90 tablet 1   tadalafil (CIALIS) 20 MG tablet Take 20 mg by mouth daily as needed.     vitamin E 400 UNIT capsule Take 400 Units by mouth daily.     No current facility-administered medications for this visit.    Allergies:   Patient has no known allergies.    Social History:  The patient  reports that he has never smoked. He has never used smokeless tobacco. He reports current alcohol use of about 10.0  standard drinks of alcohol per week. He reports that he does not use drugs.   Family History:  The patient's family history includes Cancer in his mother; Heart attack in his father and paternal uncle; Heart disease in his father.    ROS:  Please see the history of present illness.   Otherwise, review of systems are positive for none.   All other systems are reviewed and negative.    PHYSICAL EXAM: VS:  BP (!) 158/72 (BP Location: Right Arm, Patient Position: Sitting, Cuff Size: Normal)   Pulse (!) 56   Ht 6\' 3"  (1.905 m)   Wt 215 lb 8 oz (97.8 kg)   SpO2 97%   BMI 26.94 kg/m  , BMI Body mass index is 26.94 kg/m. GEN: Well nourished, well developed, in no acute distress  HEENT: normal  Neck: no JVD, carotid bruits, or masses Cardiac: RRR; no murmurs, rubs, or gallops,no edema  Respiratory:  clear to auscultation bilaterally, normal work of breathing GI: soft, nontender, nondistended, + BS MS: no deformity or atrophy  Skin: warm and dry, no rash Neuro:  Strength and sensation are intact Psych: euthymic mood, full affect   EKG:  EKG is ordered today. The ekg ordered today demonstrates sinus bradycardia with first-degree AV block and LVH.   Recent Labs: 01/22/2022: ALT 12; BUN 11; Creatinine, Ser 0.96; Potassium 4.9; Sodium 140    Lipid Panel    Component Value Date/Time   CHOL 194 01/22/2022 1146   TRIG 94 01/22/2022 1146   HDL 77 01/22/2022 1146   CHOLHDL 2.4 11/23/2016 1148   LDLCALC 100 (H) 01/22/2022 1146      Wt Readings from Last 3 Encounters:  01/05/23 215 lb 8 oz (97.8 kg)  12/04/22 211 lb 12.8 oz (96.1 kg)  11/04/22 213 lb (96.6 kg)          01/05/2023    1:59 PM  PAD Screen  Previous PAD dx? No  Previous surgical procedure? No  Pain with walking? No  Feet/toe relief with dangling? No  Painful, non-healing ulcers? No  Extremities discolored? No      ASSESSMENT AND PLAN:  1.  Paroxysmal atrial fibrillation: Currently maintaining in sinus  rhythm with low-dose amiodarone.  Labs in the last 6 months showed normal liver function test and thyroid function.  Even though his blood pressure is elevated, he reports no history of hypertension.  Even without hypertension, his CHA2DS2-VASc score is 3 and thus should consider long-term anticoagulation.  I discussed this with him today but he reports no episodes of atrial fibrillation in the last 18 months.  He prefers to stay on aspirin for now.  Will continue to discuss this with him going forward.  2.  Mild to moderate nonobstructive coronary artery disease: This was noted on cardiac CTA last year.  Currently  with no anginal symptoms.  Continue medical therapy.  3.  Hyperlipidemia: He is currently on small dose rosuvastatin and most recent lipid profile showed an LDL of 82.  We should consider increasing the dose.   Disposition:   FU with me in 6 months  Signed,  Lorine Bears, MD  01/06/2023 11:03 AM    [facility]

## 2023-01-05 NOTE — Patient Instructions (Signed)
Medication Instructions:  No chnages *If you need a refill on your cardiac medications before your next appointment, please call your pharmacy*   Lab Work: None ordered If you have labs (blood work) drawn today and your tests are completely normal, you will receive your results only by: MyChart Message (if you have MyChart) OR A paper copy in the mail If you have any lab test that is abnormal or we need to change your treatment, we will call you to review the results.   Testing/Procedures: None ordered   Follow-Up: At [facility], you and your health needs are our priority.  As part of our continuing mission to provide you with exceptional heart care, we have created designated Provider Care Teams.  These Care Teams include your primary Cardiologist (physician) and Advanced Practice Providers (APPs -  Physician Assistants and Nurse Practitioners) who all work together to provide you with the care you need, when you need it.  We recommend signing up for the patient portal called "MyChart".  Sign up information is provided on this After Visit Summary.  MyChart is used to connect with patients for Virtual Visits (Telemedicine).  Patients are able to view lab/test results, encounter notes, upcoming appointments, etc.  Non-urgent messages can be sent to your provider as well.   To learn more about what you can do with MyChart, go to [email or web address].    Your next appointment:   6 month(s)  Provider:   You may see Dr. Kirke Corin or one of the following Advanced Practice Providers on your designated Care Team:   Nicolasa Ducking, NP Eula Listen, PA-C Cadence Fransico Denis, PA-C Charlsie Quest, NP

## 2023-01-25 ENCOUNTER — Ambulatory Visit (INDEPENDENT_AMBULATORY_CARE_PROVIDER_SITE_OTHER): Payer: Medicare Other | Admitting: Family Medicine

## 2023-01-25 ENCOUNTER — Encounter: Payer: Self-pay | Admitting: Family Medicine

## 2023-01-25 VITALS — BP 120/76 | HR 53 | Ht 75.0 in | Wt 217.0 lb

## 2023-01-25 DIAGNOSIS — E782 Mixed hyperlipidemia: Secondary | ICD-10-CM

## 2023-01-25 DIAGNOSIS — R351 Nocturia: Secondary | ICD-10-CM

## 2023-01-25 MED ORDER — ROSUVASTATIN CALCIUM 5 MG PO TABS: 5.0000 mg | ORAL_TABLET | Freq: Every day | ORAL | 1 refills | Status: DC

## 2023-01-25 NOTE — Progress Notes (Signed)
Date:  01/25/2023   Name:  Seth Toste Sr.   DOB:  [birth year]   MRN:  [id number]   Chief Complaint: Hyperlipidemia  Hyperlipidemia This is a chronic problem. The current episode started more than 1 year ago. The problem is controlled. Recent lipid tests were reviewed and are normal. He has no history of chronic renal disease, diabetes, hypothyroidism, liver disease, obesity or nephrotic syndrome. There are no known factors aggravating his hyperlipidemia. Pertinent negatives include no chest pain. Current antihyperlipidemic treatment includes statins. The current treatment provides moderate improvement of lipids. There are no compliance problems.  Risk factors for coronary artery disease include dyslipidemia.    Lab Results  Component Value Date   NA 140 01/22/2022   K 4.9 01/22/2022   CO2 25 01/22/2022   GLUCOSE 92 01/22/2022   BUN 11 01/22/2022   CREATININE 0.96 01/22/2022   CALCIUM 10.0 01/22/2022   EGFR 79 01/22/2022   GFRNONAA >60 05/09/2019   Lab Results  Component Value Date   CHOL 194 01/22/2022   HDL 77 01/22/2022   LDLCALC 100 (H) 01/22/2022   TRIG 94 01/22/2022   CHOLHDL 2.4 11/23/2016   No results found for: "TSH" No results found for: "HGBA1C" Lab Results  Component Value Date   WBC 8.9 05/09/2019   HGB 13.6 05/09/2019   HCT 39.3 05/09/2019   MCV 92.9 05/09/2019   PLT 190 05/09/2019   Lab Results  Component Value Date   ALT 12 01/22/2022   AST 17 01/22/2022   ALKPHOS 52 01/22/2022   BILITOT 0.7 01/22/2022   No results found for: "25OHVITD2", "25OHVITD3", "VD25OH"   Review of Systems  Cardiovascular:  Negative for chest pain.    Patient Active Problem List   Diagnosis Date Noted   Trigger finger, right middle finger 05/13/2021   Trigger middle finger of left hand 05/13/2021   Leg cramps 09/07/2020   Frequent falls 04/02/2020   Low back pain 04/02/2020   Neuropathy 03/27/2020   Ataxia 01/25/2020   Imbalance 01/25/2020   Benign  prostatic hyperplasia without lower urinary tract symptoms 12/13/2019   Prostate cancer screening 12/13/2019   Chest pain, atypical 06/16/2019   Right inguinal hernia 12/31/2014   Atrial fibrillation (HCC) 05/03/2014   HLD (hyperlipidemia) 05/03/2014   Lumbar canal stenosis 04/12/2012    No Known Allergies  Past Surgical History:  Procedure Laterality Date   ABLATION     BACK SURGERY     COLONOSCOPY  12/06/2014   [name]   ESOPHAGOGASTRODUODENOSCOPY N/A 10/29/2022   Procedure: ESOPHAGOGASTRODUODENOSCOPY (EGD);  Surgeon: Jaynie Collins, DO;  Location: [facility] ENDOSCOPY;  Service: Gastroenterology;  Laterality: N/A;   ESOPHAGOGASTRODUODENOSCOPY     ESOPHAGOGASTRODUODENOSCOPY (EGD) WITH PROPOFOL N/A 12/04/2022   Procedure: ESOPHAGOGASTRODUODENOSCOPY (EGD) WITH PROPOFOL;  Surgeon: Jaynie Collins, DO;  Location: [facility] ENDOSCOPY;  Service: Gastroenterology;  Laterality: N/A;   EYE SURGERY  09/21/2009   HERNIA REPAIR Right 01/07/2015   right inguinal repair, medium ultra Pro mesh   LAMINECTOMY  09/21/2002    Social History   Tobacco Use   Smoking status: Never   Smokeless tobacco: Never  Vaping Use   Vaping Use: Never used  Substance Use Topics   Alcohol use: Yes    Alcohol/week: 10.0 standard drinks of alcohol    Types: 10 Standard drinks or equivalent per week    Comment: beer with dinner last night   Drug use: Never     Medication list has been reviewed and  updated.  Current Meds  Medication Sig   amiodarone (PACERONE) 100 MG tablet Take 100 mg by mouth daily.   Ascorbic Acid (VITAMIN C) 1000 MG tablet Take 1,000 mg by mouth daily.   aspirin 81 MG EC tablet Take 81 mg by mouth daily.   Flaxseed, Linseed, (FLAX SEEDS PO) Take 2 Scoops by mouth daily.   Lactobacillus (PROBIOTIC ACIDOPHILUS PO) Take 1 capsule by mouth.   metoprolol tartrate (LOPRESSOR) 25 MG tablet Take 25 mg by mouth 2 (two) times daily. Dr Lady Gary   Multiple Vitamins-Minerals (MULTIVITAMIN WITH  MINERALS) tablet Take 1 tablet by mouth daily.   Omega-3 1000 MG CAPS Take 1 capsule by mouth 2 (two) times daily. otc   pantoprazole (PROTONIX) 40 MG tablet Take 40 mg by mouth daily.   rosuvastatin (CRESTOR) 5 MG tablet Take 1 tablet (5 mg total) by mouth daily.   tadalafil (CIALIS) 20 MG tablet Take 20 mg by mouth daily as needed.   vitamin E 400 UNIT capsule Take 400 Units by mouth daily.       01/25/2023   11:04 AM 10/05/2022    4:01 PM 07/27/2022   10:32 AM 03/23/2022   10:44 AM  GAD 7 : Generalized Anxiety Score  Nervous, Anxious, on Edge 0 0 0 0  Control/stop worrying 0 0 0 0  Worry too much - different things 0 0 0 0  Trouble relaxing 0 0 0 0  Restless 0 0 0 0  Easily annoyed or irritable 0 0 0 0  Afraid - awful might happen 0 0 0 0  Total GAD 7 Score 0 0 0 0  Anxiety Difficulty Not difficult at all Not difficult at all Not difficult at all        01/25/2023   11:04 AM 11/04/2022   11:34 AM 10/05/2022    4:01 PM  Depression screen PHQ 2/9  Decreased Interest 0 0 0  Down, Depressed, Hopeless 0 0 0  PHQ - 2 Score 0 0 0  Altered sleeping 0 0 0  Tired, decreased energy 0 0 0  Change in appetite 0 0 0  Feeling bad or failure about yourself  0 0 0  Trouble concentrating 0 0 0  Moving slowly or fidgety/restless 0 0 0  Suicidal thoughts 0 0 0  PHQ-9 Score 0 0 0  Difficult doing work/chores Not difficult at all Not difficult at all Not difficult at all    BP Readings from Last 3 Encounters:  01/25/23 120/76  01/05/23 (!) 158/72  12/04/22 (!) 119/59    Physical Exam Vitals and nursing note reviewed.  HENT:     Head: Normocephalic.     Right Ear: Tympanic membrane and external ear normal.     Left Ear: Tympanic membrane and external ear normal.     Nose: Nose normal.     Mouth/Throat:     Mouth: Mucous membranes are moist.  Eyes:     General: No scleral icterus.       Right eye: No discharge.        Left eye: No discharge.     Conjunctiva/sclera: Conjunctivae  normal.     Pupils: Pupils are equal, round, and reactive to light.  Neck:     Thyroid: No thyromegaly.     Vascular: No JVD.     Trachea: No tracheal deviation.  Cardiovascular:     Rate and Rhythm: Normal rate and regular rhythm.     Heart sounds: Normal heart sounds.  No murmur heard.    No friction rub. No gallop.  Pulmonary:     Effort: No respiratory distress.     Breath sounds: Normal breath sounds. No wheezing, rhonchi or rales.  Abdominal:     General: Bowel sounds are normal.     Palpations: Abdomen is soft. There is no mass.     Tenderness: There is no abdominal tenderness. There is no guarding or rebound.  Musculoskeletal:        General: No tenderness. Normal range of motion.     Cervical back: Normal range of motion and neck supple.  Lymphadenopathy:     Cervical: No cervical adenopathy.  Skin:    General: Skin is warm.     Findings: No rash.  Neurological:     Mental Status: He is alert.     Wt Readings from Last 3 Encounters:  01/25/23 217 lb (98.4 kg)  01/05/23 215 lb 8 oz (97.8 kg)  12/04/22 211 lb 12.8 oz (96.1 kg)    BP 120/76   Pulse (!) 53   Ht 6\' 3"  (1.905 m)   Wt 217 lb (98.4 kg)   SpO2 98%   BMI 27.12 kg/m   Assessment and Plan:  1. Mixed hyperlipidemia Chronic.  Controlled.  Stable.  Continue rosuvastatin.  5 mg daily.  Will check LDL and CMP for current status.  Will recheck on as-needed basis but certainly in 6 months. - rosuvastatin (CRESTOR) 5 MG tablet; Take 1 tablet (5 mg total) by mouth daily.  Dispense: 90 tablet; Refill: 1 - Comprehensive Metabolic Panel (CMET) - Direct LDL  2. Nocturia Occasional nocturia with no new symptomatology such as hesitancy or difficulty maintaining stream.  Will check PSA for current status. - PSA    Elizabeth Sauer, MD

## 2023-01-26 LAB — COMPREHENSIVE METABOLIC PANEL
ALT: 12 IU/L (ref 0–44)
AST: 22 IU/L (ref 0–40)
Albumin/Globulin Ratio: 1.8 (ref 1.2–2.2)
Albumin: 4.3 g/dL (ref 3.7–4.7)
Alkaline Phosphatase: 48 IU/L (ref 44–121)
BUN/Creatinine Ratio: 13 (ref 10–24)
BUN: 12 mg/dL (ref 8–27)
Bilirubin Total: 0.9 mg/dL (ref 0.0–1.2)
CO2: 21 mmol/L (ref 20–29)
Calcium: 9.4 mg/dL (ref 8.6–10.2)
Chloride: 105 mmol/L (ref 96–106)
Creatinine, Ser: 0.91 mg/dL (ref 0.76–1.27)
Globulin, Total: 2.4 g/dL (ref 1.5–4.5)
Glucose: 86 mg/dL (ref 70–99)
Potassium: 4.2 mmol/L (ref 3.5–5.2)
Sodium: 135 mmol/L (ref 134–144)
Total Protein: 6.7 g/dL (ref 6.0–8.5)
eGFR: 84 mL/min/{1.73_m2} (ref >59)

## 2023-01-26 LAB — PSA: Prostate Specific Ag, Serum: 0.8 ng/mL (ref 0.0–4.0)

## 2023-01-26 LAB — LDL CHOLESTEROL, DIRECT: LDL Direct: 121 mg/dL — ABNORMAL HIGH (ref 0–99)

## 2023-02-23 ENCOUNTER — Encounter: Payer: Self-pay | Admitting: Family Medicine

## 2023-03-04 ENCOUNTER — Ambulatory Visit
Payer: Medicare Other | Attending: Student in an Organized Health Care Education/Training Program | Admitting: Student in an Organized Health Care Education/Training Program

## 2023-03-04 ENCOUNTER — Encounter: Payer: Self-pay | Admitting: Student in an Organized Health Care Education/Training Program

## 2023-03-04 VITALS — BP 165/82 | HR 53 | Temp 98.2°F | Resp 17 | Ht 75.0 in | Wt 210.0 lb

## 2023-03-04 DIAGNOSIS — Z9889 Other specified postprocedural states: Secondary | ICD-10-CM | POA: Diagnosis not present

## 2023-03-04 DIAGNOSIS — G8929 Other chronic pain: Secondary | ICD-10-CM | POA: Diagnosis not present

## 2023-03-04 DIAGNOSIS — M5442 Lumbago with sciatica, left side: Secondary | ICD-10-CM | POA: Diagnosis not present

## 2023-03-04 DIAGNOSIS — M5441 Lumbago with sciatica, right side: Secondary | ICD-10-CM

## 2023-03-04 DIAGNOSIS — M961 Postlaminectomy syndrome, not elsewhere classified: Secondary | ICD-10-CM | POA: Diagnosis not present

## 2023-03-04 DIAGNOSIS — M5416 Radiculopathy, lumbar region: Secondary | ICD-10-CM | POA: Diagnosis not present

## 2023-03-04 DIAGNOSIS — G894 Chronic pain syndrome: Secondary | ICD-10-CM | POA: Insufficient documentation

## 2023-03-04 NOTE — Progress Notes (Signed)
Safety precautions to be maintained throughout the outpatient stay will include: orient to surroundings, keep bed in low position, maintain call bell within reach at all times, provide assistance with transfer out of bed and ambulation.  

## 2023-03-04 NOTE — Patient Instructions (Addendum)
______________________________________________________________________  Preparing for your procedure  Appointments: If you think you may not be able to keep your appointment, call 24-48 hours in advance to cancel. We need time to make it available to others.  During your procedure appointment there will be: No Prescription Refills. No disability issues to discussed. No medication changes or discussions.  Instructions: Food intake: Avoid eating anything solid for at least 8 hours prior to your procedure. Clear liquid intake: You may take clear liquids such as water up to 2 hours prior to your procedure. (No carbonated drinks. No soda.) Transportation: Unless otherwise stated by your physician, bring a driver. Morning Medicines: Except for blood thinners, take all of your other morning medications with a sip of water. Make sure to take your heart and blood pressure medicines. If your blood pressure's lower number is above 100, the case will be rescheduled. Blood thinners: Make sure to stop your blood thinners as instructed.  If you take a blood thinner, but were not instructed to stop it, call our office [phone] and ask to talk to a nurse. Not stopping a blood thinner prior to certain procedures could lead to serious complications. Diabetics on insulin: Notify the staff so that you can be scheduled 1st case in the morning. If your diabetes requires high dose insulin, take only  of your normal insulin dose the morning of the procedure and notify the staff that you have done so. Preventing infections: Shower with an antibacterial soap the morning of your procedure.  Build-up your immune system: Take 1000 mg of Vitamin C with every meal (3 times a day) the day prior to your procedure. Antibiotics: Inform the nursing staff if you are taking any antibiotics or if you have any conditions that may require antibiotics prior to procedures. (Example: recent joint implants)   Pregnancy: If you are  pregnant make sure to notify the nursing staff. Not doing so may result in injury to the fetus, including death.  Sickness: If you have a cold, fever, or any active infections, call and cancel or reschedule your procedure. Receiving steroids while having an infection may result in complications. Arrival: You must be in the facility at least 30 minutes prior to your scheduled procedure. Tardiness: Your scheduled time is also the cutoff time. If you do not arrive at least 15 minutes prior to your procedure, you will be rescheduled.  Children: Do not bring any children with you. Make arrangements to keep them home. Dress appropriately: There is always a possibility that your clothing may get soiled. Avoid long dresses. Valuables: Do not bring any jewelry or valuables.  Reasons to call and reschedule or cancel your procedure: (Following these recommendations will minimize the risk of a serious complication.) Surgeries: Avoid having procedures within 2 weeks of any surgery. (Avoid for 2 weeks before or after any surgery). Flu Shots: Avoid having procedures within 2 weeks of a flu shots or . (Avoid for 2 weeks before or after immunizations). Barium: Avoid having a procedure within 7-10 days after having had a radiological study involving the use of radiological contrast. (Myelograms, Barium swallow or enema study). Heart attacks: Avoid any elective procedures or surgeries for the initial 6 months after a "Myocardial Infarction" (Heart Attack). Blood thinners: It is imperative that you stop these medications before procedures. Let us know if you if you take any blood thinner.  Infection: Avoid procedures during or within two weeks of an infection (including chest colds or gastrointestinal problems). Symptoms associated with infections   include: Localized redness, fever, chills, night sweats or profuse sweating, burning sensation when voiding, cough, congestion, stuffiness, runny nose, sore throat, diarrhea,  nausea, vomiting, cold or Flu symptoms, recent or current infections. It is specially important if the infection is over the area that we intend to treat. Heart and lung problems: Symptoms that may suggest an active cardiopulmonary problem include: cough, chest pain, breathing difficulties or shortness of breath, dizziness, ankle swelling, uncontrolled high or unusually low blood pressure, and/or palpitations. If you are experiencing any of these symptoms, cancel your procedure and contact your primary care physician for an evaluation.  Remember:  Regular Business hours are:  Monday to Thursday 8:00 AM to 4:00 PM  Provider's Schedule: Francisco Naveira, MD:  Procedure days: Tuesday and Thursday 7:30 AM to 4:00 PM  Bilal Lateef, MD:  Procedure days: Monday and Wednesday 7:30 AM to 4:00 PM  ______________________________________________________________________  Epidural Steroid Injection  An epidural steroid injection is a shot of steroid medicine, also called cortisone, and a numbing medicine that is given into the epidural space. This space is between the spinal cord and the bones of the back. This shot helps relieve pain caused by an irritated or swollen nerve root. The pain relief you get from the injection depends on the cause of your condition and how long your pain lasts. You may have a period of slightly more pain after your injection, before the steroid medicine takes effect. This medicine usually starts working within 1-3 days. In some cases, you might need 7-10 days to feel the full effect. Tell your health care provider about: Any allergies you have. All medicines you are taking, including vitamins, herbs, eye drops, creams, and over-the-counter medicines. Any problems you or family members have had with anesthesia. Any bleeding problems you have. Any surgeries you have had. Any medical conditions you have. Whether you are pregnant or may be pregnant. What are the risks? Your  health care provider will talk with you about risks. These may include: Headache. Bleeding. Infection. Allergic reaction to medicines or dyes. Nerve damage. Not being able to move (paralysis). This is rare. What happens before the procedure? Medicines You may be given medicines to lower anxiety. Ask your provider about: Changing or stopping your regular medicines. These include any diabetes medicines or blood thinners you take. Taking medicines such as aspirin and ibuprofen. These medicines can thin your blood. Do not take them unless your provider tells you to. Taking over-the-counter medicines, vitamins, herbs, and supplements. General instructions Follow instructions from your provider about what you may eat and drink. Ask your provider what steps will be taken to help prevent infection. If you will be going home right after the procedure, plan to have a responsible adult: Take you home from the hospital or clinic. You will not be allowed to drive. Care for you for the time you are told. What happens during the procedure?  An IV will be inserted into one of your veins. You may be given a sedative to help you relax. You will be asked to sit or lie on your side. The injection site will be cleaned. An X-ray machine will be used to guide the needle close to the nerve that is causing pain. A needle will be put through your skin into the epidural space. This may cause you some discomfort. Contrast dye may be injected at the site to make sure that the steroid medicine will be sent to the exact place it needs to go. The steroid   medicine and a numbing medicine will be injected into the epidural space for pain relief. The needle will be removed. A bandage (dressing) will be put over the injection site. The procedure may vary among providers and hospitals. What happens after the procedure? Your blood pressure, heart rate, breathing rate, and blood oxygen level will be monitored until you  leave the hospital or clinic. Your IV will be removed. Your arm or leg may feel weak or numb for a few hours. This information is not intended to replace advice given to you by your health care provider. Make sure you discuss any questions you have with your health care provider. Document Revised: 04/17/2022 Document Reviewed: 04/17/2022 [employer] Patient Education  2024 [employer]  

## 2023-03-04 NOTE — Progress Notes (Signed)
Patient: Seth Clinton Sr.  Service Category: E/M  Provider: Edward Jolly, MD  DOB: May 13, 1941  DOS: 03/04/2023  Referring Provider: Keith Rake, MD  MRN: [id number]  Setting: Ambulatory outpatient  PCP: Duanne Limerick, MD  Type: New Patient  Specialty: Interventional Pain Management    Location: Office  Delivery: Face-to-face     Primary Reason(s) for Visit: Encounter for initial evaluation of one or more chronic problems (new to examiner) potentially causing chronic pain, and posing a threat to normal musculoskeletal function. (Level of risk: High) CC: Back Pain (lower)  HPI  Mr. Seth Stewart is a 82 y.o. year old, male patient, who comes for the first time to our practice referred by Keith Rake, MD for our initial evaluation of his chronic pain. He has Right inguinal hernia; Atrial fibrillation (HCC); HLD (hyperlipidemia); Lumbar canal stenosis; Ataxia; Benign prostatic hyperplasia without lower urinary tract symptoms; Chest pain, atypical; Frequent falls; Imbalance; Leg cramps; Low back pain; Neuropathy; Prostate cancer screening; Trigger finger, right middle finger; Trigger middle finger of left hand; History of lumbar laminectomy; Failed back surgical syndrome; Chronic radicular lumbar pain; and Chronic pain syndrome on their problem list. Today he comes in for evaluation of his Back Pain (lower)  Pain Assessment: Location: Lower Back Radiating: denies Onset: More than a month ago Duration: Chronic pain Quality: Aching Severity: 5 /10 (subjective, self-reported pain score)  Effect on ADL: difficult to rise from bent over position, difficult to walk for more than a few minutes without pain Timing: Constant Modifying factors: sitting, reclining BP: (!) 165/82  HR: (!) 53  Onset and Duration: Present longer than 3 months Cause of pain:  bulging discs Severity: getting worse Timing: Not influenced by the time of the day and During activity or exercise Aggravating Factors: Bending,  Climbing, Lifiting, Motion, Prolonged standing, Stooping , and Walking Alleviating Factors: Lying down, Resting, Sitting, and Sleeping Associated Problems: Night-time cramps and Erectile dysfunction Quality of Pain: Aching, Deep, and Dull Previous Examinations or Tests: Endoscopy, MRI scan, and X-rays Previous Treatments: Epidural steroid injections  Mr. Seth Stewart is being evaluated for possible interventional pain management therapies for the treatment of his chronic pain.   History of 2 prior lumbar spine surgeries (laminectomy, 10 and 20 years ago respectively), presents with back pain. States that he has been dealing with back pain for over 20 years however the last 2 months noticed increased LBP. States that he had an injection- epidural 2 years ago that he states was helpful.  This was done at [facility] pain medicine.  He states that he can walk approximately 2 to 300 yards before he has pain and has to rest.  He states that he does not have any pain while he is sitting or laying down.  Used to be very active and walk 2 to 3 miles a day.  He has done physical therapy in the past.  He tries to do home stretches and exercises for his low back regularly.  Denies any numbness, tingling or weakness in his legs.  Meds   Current Outpatient Medications:    amiodarone (PACERONE) 100 MG tablet, Take 100 mg by mouth daily., Disp: , Rfl:    Ascorbic Acid (VITAMIN C) 1000 MG tablet, Take 1,000 mg by mouth daily., Disp: , Rfl:    aspirin 81 MG EC tablet, Take 81 mg by mouth daily., Disp: , Rfl:    Flaxseed, Linseed, (FLAX SEEDS PO), Take 2 Scoops by mouth daily., Disp: , Rfl:  Lactobacillus (PROBIOTIC ACIDOPHILUS PO), Take 1 capsule by mouth., Disp: , Rfl:    metoprolol tartrate (LOPRESSOR) 25 MG tablet, Take 25 mg by mouth 2 (two) times daily. Dr Lady Gary, Disp: , Rfl: 0   Multiple Vitamins-Minerals (MULTIVITAMIN WITH MINERALS) tablet, Take 1 tablet by mouth daily., Disp: , Rfl:    Omega-3 1000 MG CAPS, Take 1  capsule by mouth 2 (two) times daily. otc, Disp: , Rfl:    pantoprazole (PROTONIX) 40 MG tablet, Take 40 mg by mouth daily., Disp: , Rfl:    rosuvastatin (CRESTOR) 5 MG tablet, Take 1 tablet (5 mg total) by mouth daily., Disp: 90 tablet, Rfl: 1   tadalafil (CIALIS) 20 MG tablet, Take 20 mg by mouth daily as needed., Disp: , Rfl:    vitamin E 400 UNIT capsule, Take 400 Units by mouth daily., Disp: , Rfl:   Imaging Review   Narrative CLINICAL DATA:  Status post fall, right shoulder pain and painful range of motion  EXAM: MRI OF THE RIGHT SHOULDER WITHOUT CONTRAST  TECHNIQUE: Multiplanar, multisequence MR imaging of the shoulder was performed. No intravenous contrast was administered.  COMPARISON:  None.  FINDINGS: Rotator cuff: Complete tear of the supraspinatus tendon with 4 cm of retraction. Severe tendinosis of the infraspinatus tendon with a small full-thickness tear of the anterior fibers. Teres minor tendon is intact. Mild tendinosis of the subscapularis tendon superiorly.  Muscles:  Mild muscle atrophy of the supraspinatus muscle.  Biceps long head: Mild tendinosis of the intra-articular portion of the long head of the biceps tendon.  Acromioclavicular Joint: Severe arthropathy of the acromioclavicular joint. Type I acromion. Small amount of subacromial/subdeltoid bursal fluid.  Glenohumeral Joint: No joint effusion. Partial-thickness cartilage loss of the glenohumeral joint.  Labrum:  Posterior labral tear.  Bones:  No acute osseous abnormality.  No aggressive osseous lesion.  Other: No fluid collection or hematoma.  IMPRESSION: 1. Complete tear of the supraspinatus tendon with 4 cm of retraction. 2. Severe tendinosis of the infraspinatus tendon with a small full-thickness tear of the anterior fibers. 3. Mild tendinosis of the subscapularis tendon superiorly. 4. Mild tendinosis of the intra-articular portion of the long head of the biceps tendon. 5. Mild  osteoarthritis of the right glenohumeral joint.   Electronically Signed By: Elige Ko On: 09/08/2018 15:52  DG Hip Unilat W or Wo Pelvis 2-3 Views Right  Narrative CLINICAL DATA:  Pt slipped on water and fell last night, hitting right hip on counter. Pain to lateral side. Hx right inguinal hernia repair.  EXAM: DG HIP (WITH OR WITHOUT PELVIS) 2-3V RIGHT  COMPARISON:  CT 12/13/2014  FINDINGS: Negative for fracture. No dislocation. Bilateral hip DJD. Postop and spondylitic changes in the visualized lower lumbar spine.  IMPRESSION: No acute findings.   Electronically Signed By: Corlis Leak M.D. On: 11/11/2019 13:10  EXAM:  Unenhanced Lumbar MRI  INDICATION:  Low back pain, bilateral sciatica.  TECHNIQUE: Axial and sagittal images of the lumbar spine were obtained using multiple pulse sequences.  CONTRAST DOSE:   None.  COMPARISON: MRI from October 03, 2014 exam plain films from October 16, 2019.  FINDINGS:    Segmentation: Normal  Conus medullaris: The tip of the conus ends at L1-2 Vertebral Column: There is scoliosis convex left. Patient has undergone previous multilevel lumbar laminectomies.. There is grade 1 anterolisthesis at L4-5 of approximately 1.0 cm. There is also approximately 3 mm retrolisthesis L2 on L3  At T11-12, . There is mild disc bulge and  spondylosis without central or foraminal stenosis  At T12-L1, there has been interval progression of degree of disc degeneration. There is diffuse disc bulge and spondylosis with a superimposed large disc herniation which has developed since the previous study. It is at the disc level and extends inferior to and even more superior to the disc level. Portion of this herniated disc lies posterior to the inferior one third of T12 and posterior to the proximal 1/ 6 of L1. There is resulting moderate to severe central canal stenosis with compression of the conus. There is bilateral lateral recess stenosis.  There is moderate to severe bilateral foraminal stenosis.  L1-L2: The disc is degenerated. There is diffuse disc bulge and spondylosis. There are facet degenerative changes and hypertrophy and ligamentum flavum hypertrophy. There has been previous laminectomy at this level. There is distortion of the thecal sac with more compression posterior laterally from the right facet at the left. There is improvement in degree of central canal stenosis as the previous study however there is still mild to moderate central canal stenosis. There is bilateral lateral recess stenosis and moderate right and moderate to severe left foraminal stenosis  L2-L3: The disc is degenerated. There is diffuse disc bulge and spondylosis. Period there is superimposed small posterior right disc herniation extending into the right lateral recess. There are facet degenerative changes and hypertrophy and ligamentum flavum hypertrophy. Patient has undergone decompression laminectomy. There has been improvement in degree of central canal stenosis however there is still moderate central canal stenosis present. There is bilateral lateral recess stenosis, right greater than left. There is severe right and moderate to severe left foraminal stenosis  L3-4: The disc is degenerated. There is diffuse disc bulge and spondylosis. There are facet degenerative changes and hypertrophy. Patient has undergone laminectomy at this level. There is no longer significant central canal stenosis at this level. There is stenosis of the superior aspect of both lateral recesses. There is severe right and mild to moderate left foraminal stenosis.  At L4-5, there is grade 1 anterolisthesis. The disc is calcified as noted on plain film. There is diffuse disc bulge and spondylosis. Patient has undergone decompression laminectomy at this level. There are facet degenerative changes and hypertrophy and ligamentum flavum hypertrophy. There is  persistent moderate to severe central canal stenosis. There is persistent bilateral lateral recess stenosis and persistent moderate to severe right and mild to moderate left foraminal stenosis  L5-S1: The disc is degenerated. Patient is undergone decompression laminectomy. There are facet degenerative changes and hypertrophy. There is no significant central canal stenosis. There is mild to moderate right and mild left foraminal stenosis. There is also stenosis of the superior aspect of both lateral recesses.  IMPRESSION: 1. Scoliosis 2. Status post decompression laminectomies at multiple levels with some improvement in degree of central canal stenosis at most levels with exception of L4-5 where there is persistent moderate to severe central canal stenosis and there is still moderate central canal stenosis at L2-3 but this represents an improvement compared with prior 3. Multilevel lateral recess and foraminal stenosis as outlined above. 4. Interval development of increased degenerative disc disease at T12-L1 with superimposed large disc herniation as described. This has developed since the previous study and results in moderate to severe central canal stenosis and compression of the conus. There is also bilateral lateral recess and bilateral foraminal stenosis at this level  5. Please see individual levels above for more complete description  Electronically Signed by:  Ebony Hail, MD,  [facility] Radiology Electronically Signed on:  10/16/2020 1:39 PM Procedure Note    Complexity Note: Imaging results reviewed.                         ROS  Cardiovascular: Abnormal heart rhythm Pulmonary or Respiratory: No reported pulmonary signs or symptoms such as wheezing and difficulty taking a deep full breath (Asthma), difficulty blowing air out (Emphysema), coughing up mucus (Bronchitis), persistent dry cough, or temporary stoppage of breathing during sleep Neurological: No reported  neurological signs or symptoms such as seizures, abnormal skin sensations, urinary and/or fecal incontinence, being born with an abnormal open spine and/or a tethered spinal cord Psychological-Psychiatric: No reported psychological or psychiatric signs or symptoms such as difficulty sleeping, anxiety, depression, delusions or hallucinations (schizophrenial), mood swings (bipolar disorders) or suicidal ideations or attempts Gastrointestinal: Vomiting blood (Ulcers) Genitourinary: No reported renal or genitourinary signs or symptoms such as difficulty voiding or producing urine, peeing blood, non-functioning kidney, kidney stones, difficulty emptying the bladder, difficulty controlling the flow of urine, or chronic kidney disease Hematological: No reported hematological signs or symptoms such as prolonged bleeding, low or poor functioning platelets, bruising or bleeding easily, hereditary bleeding problems, low energy levels due to low hemoglobin or being anemic Endocrine: No reported endocrine signs or symptoms such as high or low blood sugar, rapid heart rate due to high thyroid levels, obesity or weight gain due to slow thyroid or thyroid disease Rheumatologic: No reported rheumatological signs and symptoms such as fatigue, joint pain, tenderness, swelling, redness, heat, stiffness, decreased range of motion, with or without associated rash Musculoskeletal: Negative for myasthenia gravis, muscular dystrophy, multiple sclerosis or malignant hyperthermia Work History: Retired  Allergies  Mr. Giustino has No Known Allergies.  Laboratory Chemistry Profile   Renal Lab Results  Component Value Date   BUN 12 01/25/2023   CREATININE 0.91 01/25/2023   BCR 13 01/25/2023   GFRAA >60 05/09/2019   GFRNONAA >60 05/09/2019     Electrolytes Lab Results  Component Value Date   NA 135 01/25/2023   K 4.2 01/25/2023   CL 105 01/25/2023   CALCIUM 9.4 01/25/2023   PHOS 3.3 11/11/2015     Hepatic Lab  Results  Component Value Date   AST 22 01/25/2023   ALT 12 01/25/2023   ALBUMIN 4.3 01/25/2023   ALKPHOS 48 01/25/2023     ID No results found for: "LYMEIGGIGMAB", "HIV", "SARSCOV2NAA", "STAPHAUREUS", "MRSAPCR", "HCVAB", "PREGTESTUR", "RMSFIGG", "QFVRPH1IGG", "QFVRPH2IGG"   Bone No results found for: "VD25OH", "VD125OH2TOT", "ZO1096EA5", "WU9811BJ4", "25OHVITD1", "25OHVITD2", "25OHVITD3", "TESTOFREE", "TESTOSTERONE"   Endocrine Lab Results  Component Value Date   GLUCOSE 86 01/25/2023     Neuropathy No results found for: "VITAMINB12", "FOLATE", "HGBA1C", "HIV"   CNS No results found for: "COLORCSF", "APPEARCSF", "RBCCOUNTCSF", "WBCCSF", "POLYSCSF", "LYMPHSCSF", "EOSCSF", "PROTEINCSF", "GLUCCSF", "JCVIRUS", "CSFOLI", "IGGCSF", "LABACHR", "ACETBL"   Inflammation (CRP: Acute  ESR: Chronic) No results found for: "CRP", "ESRSEDRATE", "LATICACIDVEN"   Rheumatology No results found for: "RF", "ANA", "LABURIC", "URICUR", "LYMEIGGIGMAB", "LYMEABIGMQN", "HLAB27"   Coagulation Lab Results  Component Value Date   PLT 190 05/09/2019     Cardiovascular Lab Results  Component Value Date   HGB 13.6 05/09/2019   HCT 39.3 05/09/2019     Screening No results found for: "SARSCOV2NAA", "COVIDSOURCE", "STAPHAUREUS", "MRSAPCR", "HCVAB", "HIV", "PREGTESTUR"   Cancer No results found for: "CEA", "CA125", "LABCA2"   Allergens No results found for: "ALMOND", "APPLE", "ASPARAGUS", "AVOCADO", "BANANA", "BARLEY", "BASIL", "BAYLEAF", "GREENBEAN", "LIMABEAN", "WHITEBEAN", "BEEFIGE", "  REDBEET", "BLUEBERRY", "BROCCOLI", "CABBAGE", "MELON", "CARROT", "CASEIN", "CASHEWNUT", "CAULIFLOWER", "CELERY"     Note: Lab results reviewed.  PFSH  Drug: Mr. Manjarres  reports no history of drug use. Alcohol:  reports current alcohol use of about 10.0 standard drinks of alcohol per week. Tobacco:  reports that he has never smoked. He has never used smokeless tobacco. Medical:  has a past medical history of AF  (atrial fibrillation) (HCC), Back pain, Dysphagia, Dysrhythmia, Hemorrhoids, Hyperlipidemia, and Hypertension. Family: family history includes Cancer in his mother; Heart attack in his father and paternal uncle; Heart disease in his father.  Past Surgical History:  Procedure Laterality Date   ABLATION     BACK SURGERY     COLONOSCOPY  12/06/2014   [name]   ESOPHAGOGASTRODUODENOSCOPY N/A 10/29/2022   Procedure: ESOPHAGOGASTRODUODENOSCOPY (EGD);  Surgeon: Jaynie Collins, DO;  Location: [facility] ENDOSCOPY;  Service: Gastroenterology;  Laterality: N/A;   ESOPHAGOGASTRODUODENOSCOPY     ESOPHAGOGASTRODUODENOSCOPY (EGD) WITH PROPOFOL N/A 12/04/2022   Procedure: ESOPHAGOGASTRODUODENOSCOPY (EGD) WITH PROPOFOL;  Surgeon: Jaynie Collins, DO;  Location: [facility] ENDOSCOPY;  Service: Gastroenterology;  Laterality: N/A;   EYE SURGERY  09/21/2009   HERNIA REPAIR Right 01/07/2015   right inguinal repair, medium ultra Pro mesh   LAMINECTOMY  09/21/2002   Active Ambulatory Problems    Diagnosis Date Noted   Right inguinal hernia 12/31/2014   Atrial fibrillation (HCC) 05/03/2014   HLD (hyperlipidemia) 05/03/2014   Lumbar canal stenosis 04/12/2012   Ataxia 01/25/2020   Benign prostatic hyperplasia without lower urinary tract symptoms 12/13/2019   Chest pain, atypical 06/16/2019   Frequent falls 04/02/2020   Imbalance 01/25/2020   Leg cramps 09/07/2020   Low back pain 04/02/2020   Neuropathy 03/27/2020   Prostate cancer screening 12/13/2019   Trigger finger, right middle finger 05/13/2021   Trigger middle finger of left hand 05/13/2021   History of lumbar laminectomy 03/04/2023   Failed back surgical syndrome 03/04/2023   Chronic radicular lumbar pain 03/04/2023   Chronic pain syndrome 03/04/2023   Resolved Ambulatory Problems    Diagnosis Date Noted   No Resolved Ambulatory Problems   Past Medical History:  Diagnosis Date   AF (atrial fibrillation) (HCC)    Back pain    Dysphagia     Dysrhythmia    Hemorrhoids    Hyperlipidemia    Hypertension    Constitutional Exam  General appearance: Well nourished, well developed, and well hydrated. In no apparent acute distress Vitals:   03/04/23 0750  BP: (!) 165/82  Pulse: (!) 53  Resp: 17  Temp: 98.2 F (36.8 C)  TempSrc: Temporal  SpO2: 99%  Weight: 210 lb (95.3 kg)  Height: 6\' 3"  (1.905 m)   BMI Assessment: Estimated body mass index is 26.25 kg/m as calculated from the following:   Height as of this encounter: 6\' 3"  (1.905 m).   Weight as of this encounter: 210 lb (95.3 kg).  BMI interpretation table: BMI level Category Range association with higher incidence of chronic pain  <18 kg/m2 Underweight   18.5-24.9 kg/m2 Ideal body weight   25-29.9 kg/m2 Overweight Increased incidence by 20%  30-34.9 kg/m2 Obese (Class I) Increased incidence by 68%  35-39.9 kg/m2 Severe obesity (Class II) Increased incidence by 136%  >40 kg/m2 Extreme obesity (Class III) Increased incidence by 254%   Patient's current BMI Ideal Body weight  Body mass index is 26.25 kg/m. Ideal body weight: 84.5 kg (186 lb 4.6 oz) Adjusted ideal body weight: 88.8 kg (195  lb 12.4 oz)   BMI Readings from Last 4 Encounters:  03/04/23 26.25 kg/m  01/25/23 27.12 kg/m  01/05/23 26.94 kg/m  12/04/22 26.47 kg/m   Wt Readings from Last 4 Encounters:  03/04/23 210 lb (95.3 kg)  01/25/23 217 lb (98.4 kg)  01/05/23 215 lb 8 oz (97.8 kg)  12/04/22 211 lb 12.8 oz (96.1 kg)    Psych/Mental status: Alert, oriented x 3 (person, place, & time)       Eyes: PERLA Respiratory: No evidence of acute respiratory distress  Thoracic Spine Area Exam  Skin & Axial Inspection: No masses, redness, or swelling Alignment: Symmetrical Functional ROM: Pain restricted ROM Stability: No instability detected Muscle Tone/Strength: Functionally intact. No obvious neuro-muscular anomalies detected. Sensory (Neurological): Unimpaired Muscle strength & Tone: No  palpable anomalies Lumbar Spine Area Exam  Skin & Axial Inspection: Well healed scar from previous spine surgery detected Alignment: Symmetrical Functional ROM: Pain restricted ROM affecting both sides Stability: No instability detected Muscle Tone/Strength: Functionally intact. No obvious neuro-muscular anomalies detected. Sensory (Neurological): Neurogenic pain pattern  Gait & Posture Assessment  Ambulation: Unassisted Gait: Relatively normal for age and body habitus Posture: WNL  Lower Extremity Exam    Side: Right lower extremity  Side: Left lower extremity  Stability: No instability observed          Stability: No instability observed          Skin & Extremity Inspection: Skin color, temperature, and hair growth are WNL. No peripheral edema or cyanosis. No masses, redness, swelling, asymmetry, or associated skin lesions. No contractures.  Skin & Extremity Inspection: Skin color, temperature, and hair growth are WNL. No peripheral edema or cyanosis. No masses, redness, swelling, asymmetry, or associated skin lesions. No contractures.  Functional ROM: Unrestricted ROM                  Functional ROM: Unrestricted ROM                  Muscle Tone/Strength: Functionally intact. No obvious neuro-muscular anomalies detected.  Muscle Tone/Strength: Functionally intact. No obvious neuro-muscular anomalies detected.  Sensory (Neurological): Unimpaired        Sensory (Neurological): Unimpaired        DTR: Patellar: deferred today Achilles: deferred today Plantar: deferred today  DTR: Patellar: deferred today Achilles: deferred today Plantar: deferred today  Palpation: No palpable anomalies  Palpation: No palpable anomalies    Assessment  Primary Diagnosis & Pertinent Problem List: The primary encounter diagnosis was Chronic radicular lumbar pain. Diagnoses of Chronic bilateral low back pain with bilateral sciatica, Hx of decompressive lumbar laminectomy (L1-S1), Failed back surgical  syndrome, History of lumbar laminectomy, and Chronic pain syndrome were also pertinent to this visit.  Visit Diagnosis (New problems to examiner): 1. Chronic radicular lumbar pain   2. Chronic bilateral low back pain with bilateral sciatica   3. Hx of decompressive lumbar laminectomy (L1-S1)   4. Failed back surgical syndrome   5. History of lumbar laminectomy   6. Chronic pain syndrome    Plan of Care (Initial workup plan)  Reviewed MRI with patient.  He has lumbar decompression and laminectomy from L1-S1.  He has adjacent segment disease at T12-L1 with a large disc herniation resulting in moderate to severe central canal stenosis with compression of the conus.  There is also bilateral recess stenosis.  There is moderate to severe bilateral foraminal stenosis at this level.  I offered him a lumbar epidural steroid injection at T12-L1.  Risk and benefits of this were reviewed.  We also discussed spinal cord stimulation for the management of his pain.  Procedure Orders         Lumbar Epidural Injection     Phar Provider-requested follow-up: Return in about 18 days (around 03/22/2023) for T12/L1 ESI , in clinic [facility].  Future Appointments  Date Time Provider Department Center  03/22/2023  1:20 PM Edward Jolly, MD [facility] None  07/29/2023 10:40 AM Duanne Limerick, MD [facility]  11/10/2023 10:15 AM PC[facility]-ANNUAL WELLNESS VISIT [facility]    Duration of encounter: .  Total time on encounter, as per AMA guidelines included both the face-to-face and non-face-to-face time personally spent by the physician and/or other qualified health care professional(s) on the day of the encounter (includes time in activities that require the physician or other qualified health care professional and does not include time in activities normally performed by clinical staff). Physician's time may include the following activities when performed: Preparing to see the patient (e.g., pre-charting review of  records, searching for previously ordered imaging, lab work, and nerve conduction tests) Review of prior analgesic pharmacotherapies. Reviewing PMP Interpreting ordered tests (e.g., lab work, imaging, nerve conduction tests) Performing post-procedure evaluations, including interpretation of diagnostic procedures Obtaining and/or reviewing separately obtained history Performing a medically appropriate examination and/or evaluation Counseling and educating the patient/family/caregiver Ordering medications, tests, or procedures Referring and communicating with other health care professionals (when not separately reported) Documenting clinical information in the electronic or other health record Independently interpreting results (not separately reported) and communicating results to the patient/ family/caregiver Care coordination (not separately reported)  Note by: Edward Jolly, MD (TTS technology used. I apologize for any typographical errors that were not detected and corrected.) Date: 03/04/2023; Time: 9:06 AM

## 2023-03-22 ENCOUNTER — Ambulatory Visit
Payer: Medicare Other | Attending: Student in an Organized Health Care Education/Training Program | Admitting: Student in an Organized Health Care Education/Training Program

## 2023-03-22 ENCOUNTER — Ambulatory Visit
Admission: RE | Admit: 2023-03-22 | Discharge: 2023-03-22 | Disposition: A | Discharge status: Home / Self Care | Payer: Medicare Other | Source: Ambulatory Visit | Attending: Student in an Organized Health Care Education/Training Program | Admitting: Student in an Organized Health Care Education/Training Program

## 2023-03-22 ENCOUNTER — Encounter: Payer: Self-pay | Admitting: Student in an Organized Health Care Education/Training Program

## 2023-03-22 VITALS — BP 151/92 | HR 46 | Temp 98.0°F | Resp 15 | Ht 75.0 in | Wt 208.0 lb

## 2023-03-22 DIAGNOSIS — Z9889 Other specified postprocedural states: Secondary | ICD-10-CM

## 2023-03-22 DIAGNOSIS — M5414 Radiculopathy, thoracic region: Secondary | ICD-10-CM

## 2023-03-22 DIAGNOSIS — G8929 Other chronic pain: Secondary | ICD-10-CM | POA: Insufficient documentation

## 2023-03-22 DIAGNOSIS — M961 Postlaminectomy syndrome, not elsewhere classified: Secondary | ICD-10-CM | POA: Insufficient documentation

## 2023-03-22 DIAGNOSIS — M5416 Radiculopathy, lumbar region: Secondary | ICD-10-CM | POA: Insufficient documentation

## 2023-03-22 MED ORDER — ROPIVACAINE HCL 2 MG/ML IJ SOLN
2.0000 mL | Freq: Once | INTRAMUSCULAR | Status: AC
  Administered 2023-03-22: 2 mL via EPIDURAL
  Filled 2023-03-22: qty 20

## 2023-03-22 MED ORDER — IOHEXOL 180 MG/ML  SOLN
10.0000 mL | Freq: Once | INTRAMUSCULAR | Status: AC
  Administered 2023-03-22: 10 mL via EPIDURAL
  Filled 2023-03-22: qty 20

## 2023-03-22 MED ORDER — SODIUM CHLORIDE 0.9% FLUSH
2.0000 mL | Freq: Once | INTRAVENOUS | Status: AC
  Administered 2023-03-22: 2 mL

## 2023-03-22 MED ORDER — DEXAMETHASONE SODIUM PHOSPHATE 10 MG/ML IJ SOLN
10.0000 mg | Freq: Once | INTRAMUSCULAR | Status: AC
  Administered 2023-03-22: 10 mg
  Filled 2023-03-22: qty 1

## 2023-03-22 MED ORDER — LIDOCAINE HCL 2 % IJ SOLN
20.0000 mL | Freq: Once | INTRAMUSCULAR | Status: AC
  Administered 2023-03-22: 400 mg
  Filled 2023-03-22: qty 20

## 2023-03-22 MED ORDER — SODIUM CHLORIDE (PF) 0.9 % IJ SOLN
INTRAMUSCULAR | Status: AC
  Filled 2023-03-22: qty 10

## 2023-03-22 NOTE — Progress Notes (Signed)
Safety precautions to be maintained throughout the outpatient stay will include: orient to surroundings, keep bed in low position, maintain call bell within reach at all times, provide assistance with transfer out of bed and ambulation.  

## 2023-03-22 NOTE — Patient Instructions (Signed)

## 2023-03-22 NOTE — Progress Notes (Signed)
PROVIDER NOTE: Interpretation of information contained herein should be left to medically-trained personnel. Specific patient instructions are provided elsewhere under "Patient Instructions" section of medical record. This document was created in part using STT-dictation technology, any transcriptional errors that may result from this process are unintentional.  Patient: Seth Stewart. Type: Established DOB: 1940-12-08 MRN: [id number] PCP: Duanne Limerick, MD  Service: Procedure DOS: 03/22/2023 Setting: Ambulatory Location: Ambulatory outpatient facility Delivery: Face-to-face Provider: Edward Jolly, MD Specialty: Interventional Pain Management Specialty designation: 09 Location: Outpatient facility Ref. Prov.: Edward Jolly, MD       Interventional Therapy   Primary Reason for Visit: Interventional Pain Management Treatment. CC: Back Pain (mid)  Procedure:           Inter-Laminar Thoracic Epidural Steroid Block/Injection  #1  Laterality:  Midline Level: T11-12  Imaging: Fluoroscopic guidance Anesthesia: Local anesthesia (1-2% Lidocaine) Anxiolysis: None Sedation: None.  DOS: 03/22/2023 Performed by: Edward Jolly, MD  Purpose: Diagnostic/Therapeutic Indications: Thoracic back pain, radicular pain, with degenerative disc disease severe enough to impact quality of life or function. 1. Hx of decompressive lumbar laminectomy (L1-S1)   2. Chronic radicular lumbar pain   3. Failed back surgical syndrome    NAS-11 Pain score:   Pre-procedure: 5 /10   Post-procedure: 0-No pain/10     Position / Prep / Materials:  Position: Prone  Prep solution: DuraPrep (Iodine Povacrylex [0.7% available iodine] and Isopropyl Alcohol, 74% w/w) Prep Area: Posterior Thoracolumbar (Upper back from shoulders to lower lumbar region).  Materials:  Tray: Epidural Needle(s) Type: Epidural needle Gauge (G): 22 Length: Regular (3.5-in) Qty: 1  H&P (Pre-op Assessment):  Mr. Bonnette is a 82  y.o. (year old), male patient, seen today for interventional treatment. He  has a past surgical history that includes Eye surgery (09/21/2009); Laminectomy (09/21/2002); Colonoscopy (12/06/2014); Ablation; Hernia repair (Right, 01/07/2015); Esophagogastroduodenoscopy (N/A, 10/29/2022); Back surgery; Esophagogastroduodenoscopy; and Esophagogastroduodenoscopy (egd) with propofol (N/A, 12/04/2022). Mr. Virginia has a current medication list which includes the following prescription(s): amiodarone, vitamin c, aspirin ec, flaxseed (linseed), lactobacillus, metoprolol tartrate, multivitamin with minerals, omega-3, rosuvastatin, tadalafil, vitamin e, and pantoprazole. His primarily concern today is the Back Pain (mid)  Initial Vital Signs:  Pulse/HCG Rate: (!) 46ECG Heart Rate: 76 Temp: 98 F (36.7 C) Resp: 16 BP: (!) 152/109 SpO2: 97 %  BMI: Estimated body mass index is 26 kg/m as calculated from the following:   Height as of this encounter: 6\' 3"  (1.905 m).   Weight as of this encounter: 208 lb (94.3 kg).  Risk Assessment: Allergies: Reviewed. He has No Known Allergies.  Allergy Precautions: None required Coagulopathies: Reviewed. None identified.  Blood-thinner therapy: None at this time Active Infection(s): Reviewed. None identified. Mr. Stanislaw is afebrile  Site Confirmation: Mr. Hafley was asked to confirm the procedure and laterality before marking the site Procedure checklist: Completed Consent: Before the procedure and under the influence of no sedative(s), amnesic(s), or anxiolytics, the patient was informed of the treatment options, risks and possible complications. To fulfill our ethical and legal obligations, as recommended by the American Medical Association's Code of Ethics, I have informed the patient of my clinical impression; the nature and purpose of the treatment or procedure; the risks, benefits, and possible complications of the intervention; the alternatives, including doing  nothing; the risk(s) and benefit(s) of the alternative treatment(s) or procedure(s); and the risk(s) and benefit(s) of doing nothing. The patient was provided information about the general risks and possible complications associated with the procedure. These  may include, but are not limited to: failure to achieve desired goals, infection, bleeding, organ or nerve damage, allergic reactions, paralysis, and death. In addition, the patient was informed of those risks and complications associated to Spine-related procedures, such as failure to decrease pain; infection (i.e.: Meningitis, epidural or intraspinal abscess); bleeding (i.e.: epidural hematoma, subarachnoid hemorrhage, or any other type of intraspinal or peri-dural bleeding); organ or nerve damage (i.e.: Any type of peripheral nerve, nerve root, or spinal cord injury) with subsequent damage to sensory, motor, and/or autonomic systems, resulting in permanent pain, numbness, and/or weakness of one or several areas of the body; allergic reactions; (i.e.: anaphylactic reaction); and/or death. Furthermore, the patient was informed of those risks and complications associated with the medications. These include, but are not limited to: allergic reactions (i.e.: anaphylactic or anaphylactoid reaction(s)); adrenal axis suppression; blood sugar elevation that in diabetics may result in ketoacidosis or comma; water retention that in patients with history of congestive heart failure may result in shortness of breath, pulmonary edema, and decompensation with resultant heart failure; weight gain; swelling or edema; medication-induced neural toxicity; particulate matter embolism and blood vessel occlusion with resultant organ, and/or nervous system infarction; and/or aseptic necrosis of one or more joints. Finally, the patient was informed that Medicine is not an exact science; therefore, there is also the possibility of unforeseen or unpredictable risks and/or possible  complications that may result in a catastrophic outcome. The patient indicated having understood very clearly. We have given the patient no guarantees and we have made no promises. Enough time was given to the patient to ask questions, all of which were answered to the patient's satisfaction. Mr. Eichmann has indicated that he wanted to continue with the procedure. Attestation: I, the ordering provider, attest that I have discussed with the patient the benefits, risks, side-effects, alternatives, likelihood of achieving goals, and potential problems during recovery for the procedure that I have provided informed consent. Date  Time: 03/22/2023 12:57 PM   Pre-Procedure Preparation:  Monitoring: As per clinic protocol. Respiration, ETCO2, SpO2, BP, heart rate and rhythm monitor placed and checked for adequate function Safety Precautions: Patient was assessed for positional comfort and pressure points before starting the procedure. Time-out: I initiated and conducted the "Time-out" before starting the procedure, as per protocol. The patient was asked to participate by confirming the accuracy of the "Time Out" information. Verification of the correct person, site, and procedure were performed and confirmed by me, the nursing staff, and the patient. "Time-out" conducted as per Joint Commission's Universal Protocol (UP.01.01.01). Time: 1334 Start Time: 1334 hrs.  Description of Procedure:          Target Area: For Epidural Steroid injection(s), the target area is the  interlaminar space, initially targeting the lower border of the superior vertebral body lamina. Approach: Interlaminar approach. Area Prepped: Entire Posterior Thoracolumbar Region DuraPrep (Iodine Povacrylex [0.7% available iodine] and Isopropyl Alcohol, 74% w/w) Safety Precautions: Aspiration looking for blood return was conducted prior to all injections. At no point did we inject any substances, as a needle was being advanced. No attempts  were made at seeking any paresthesias. Safe injection practices and needle disposal techniques used. Medications properly checked for expiration dates. SDV (single dose vial) medications used. Description of the Procedure: Protocol guidelines were followed. The patient was placed in position over the fluoroscopy table. The target area was identified and the area prepped in the usual manner. Skin & deeper tissues infiltrated with local anesthetic. Appropriate amount of time allowed  to pass for local anesthetics to take effect. The procedure needles were then advanced to the target area. The inferior aspect of the superior lamina was contacted and the needle walked caudad, until the lamina was cleared. The epidural space was identified using "loss-of-resistance technique" with 0.9% PF-NSS (2-24mL), in a low friction 10cc LOR glass syringe. Proper needle placement was secured. Negative aspiration confirmed. Solution injected in intermittent fashion, asking for systemic symptoms every 0.5 cc of injectate. The needles were then removed and the area cleansed, making sure to leave some of the prepping solution behind to take advantage of its long term bactericidal properties. Vitals:   03/22/23 1309 03/22/23 1330 03/22/23 1335 03/22/23 1338  BP:  (!) 152/109 (!) 147/90 (!) 151/92  Pulse: (!) 46     Resp: 16 11 15 15   Temp: 98 F (36.7 C)     SpO2: 97% 99% 97% 99%  Weight: 208 lb (94.3 kg)     Height: 6\' 3"  (1.905 m)       Start Time: 1334 hrs. End Time: 1337 hrs. Imaging Guidance (Spinal):          Type of Imaging Technique: Fluoroscopy Guidance (Spinal) Indication(s): Assistance in needle guidance and placement for procedures requiring needle placement in or near specific anatomical locations not easily accessible without such assistance. Exposure Time: Please see nurses notes. Contrast: Before injecting any contrast, we confirmed that the patient did not have an allergy to iodine, shellfish, or  radiological contrast. Once satisfactory needle placement was completed at the desired level, radiological contrast was injected. Contrast injected under live fluoroscopy. No contrast complications. See chart for type and volume of contrast used. Fluoroscopic Guidance: I was personally present during the use of fluoroscopy. "Tunnel Vision Technique" used to obtain the best possible view of the target area. Parallax error corrected before commencing the procedure. "Direction-depth-direction" technique used to introduce the needle under continuous pulsed fluoroscopy. Once target was reached, antero-posterior, oblique, and lateral fluoroscopic projection used confirm needle placement in all planes. Images permanently stored in EMR. Interpretation: I personally interpreted the imaging intraoperatively. Adequate needle placement confirmed in multiple planes. Appropriate spread of contrast into desired area was observed. No evidence of afferent or efferent intravascular uptake. No intrathecal or subarachnoid spread observed. Permanent images saved into the patient's record.  Antibiotic Prophylaxis:   Anti-infectives (From admission, onward)    None      Indication(s): None identified  Post-operative Assessment:  Post-procedure Vital Signs:  Pulse/HCG Rate: (!) 4665 Temp: 98 F (36.7 C) Resp: 15 BP: (!) 151/92 SpO2: 99 %  EBL: None  Complications: No immediate post-treatment complications observed by team, or reported by patient.  Note: The patient tolerated the entire procedure well. A repeat set of vitals were taken after the procedure and the patient was kept under observation following institutional policy, for this type of procedure. Post-procedural neurological assessment was performed, showing return to baseline, prior to discharge. The patient was provided with post-procedure discharge instructions, including a section on how to identify potential problems. Should any problems arise  concerning this procedure, the patient was given instructions to immediately contact us, at any time, without hesitation. In any case, we plan to contact the patient by telephone for a follow-up status report regarding this interventional procedure.  Comments:  No additional relevant information.  Plan of Care (POC)  Orders:  Orders Placed This Encounter  Procedures   DG PAIN CLINIC C-ARM 1-60 MIN NO REPORT    Intraoperative interpretation by procedural  physician at [facility].    Standing Status:   Standing    Number of Occurrences:   1    Order Specific Question:   Reason for exam:    Answer:   Assistance in needle guidance and placement for procedures requiring needle placement in or near specific anatomical locations not easily accessible without such assistance.     Medications ordered for procedure: Meds ordered this encounter  Medications   iohexol (OMNIPAQUE) 180 MG/ML injection 10 mL    Must be Myelogram-compatible. If not available, you may substitute with a water-soluble, non-ionic, hypoallergenic, myelogram-compatible radiological contrast medium.   lidocaine (XYLOCAINE) 2 % (with pres) injection 400 mg   sodium chloride flush (NS) 0.9 % injection 2 mL   ropivacaine (PF) 2 mg/mL (0.2%) (NAROPIN) injection 2 mL   dexamethasone (DECADRON) injection 10 mg   Medications administered: We administered iohexol, lidocaine, sodium chloride flush, ropivacaine (PF) 2 mg/mL (0.2%), and dexamethasone.  See the medical record for exact dosing, route, and time of administration.  Follow-up plan:   Return in about 4 weeks (around 04/19/2023) for Post Procedure Evaluation, in person.       T11/T12 ESI (hx of L1-L5 decompression)    Recent Visits Date Type Provider Dept  03/04/23 Office Visit Edward Jolly, MD [facility]  Showing recent visits within past 90 days and meeting all other requirements Today's Visits Date Type Provider Dept  03/22/23 Procedure  visit Edward Jolly, MD [facility]  Showing today's visits and meeting all other requirements Future Appointments Date Type Provider Dept  04/26/23 Appointment Edward Jolly, MD [facility]  Showing future appointments within next 90 days and meeting all other requirements  Disposition: Discharge home  Discharge (Date  Time): 03/22/2023; 1343 hrs.   Primary Care Physician: Duanne Limerick, MD Location: [facility] Note by: Edward Jolly, MD (TTS technology used. I apologize for any typographical errors that were not detected and corrected.) Date: 03/22/2023; Time: 1:56 PM  Disclaimer:  Medicine is not an Visual merchandiser. The only guarantee in medicine is that nothing is guaranteed. It is important to note that the decision to proceed with this intervention was based on the information collected from the patient. The Data and conclusions were drawn from the patient's questionnaire, the interview, and the physical examination. Because the information was provided in large part by the patient, it cannot be guaranteed that it has not been purposely or unconsciously manipulated. Every effort has been made to obtain as much relevant data as possible for this evaluation. It is important to note that the conclusions that lead to this procedure are derived in large part from the available data. Always take into account that the treatment will also be dependent on availability of resources and existing treatment guidelines, considered by other Pain Management Practitioners as being common knowledge and practice, at the time of the intervention. For Medico-Legal purposes, it is also important to point out that variation in procedural techniques and pharmacological choices are the acceptable norm. The indications, contraindications, technique, and results of the above procedure should only be interpreted and judged by a Board-Certified Interventional Pain Specialist  with extensive familiarity and expertise in the same exact procedure and technique.

## 2023-03-23 ENCOUNTER — Telehealth: Payer: Self-pay | Admitting: *Deleted

## 2023-03-23 NOTE — Telephone Encounter (Signed)
No problems post procedure. 

## 2023-04-19 ENCOUNTER — Ambulatory Visit: Payer: Medicare Other | Admitting: Student in an Organized Health Care Education/Training Program

## 2023-04-23 ENCOUNTER — Encounter: Payer: Self-pay | Admitting: Cardiovascular Disease

## 2023-04-26 ENCOUNTER — Encounter: Payer: Self-pay | Admitting: Student in an Organized Health Care Education/Training Program

## 2023-04-26 ENCOUNTER — Ambulatory Visit
Payer: Medicare Other | Attending: Student in an Organized Health Care Education/Training Program | Admitting: Student in an Organized Health Care Education/Training Program

## 2023-04-26 VITALS — BP 126/76 | HR 51 | Temp 97.2°F | Resp 16 | Ht 75.0 in | Wt 207.0 lb

## 2023-04-26 DIAGNOSIS — M5414 Radiculopathy, thoracic region: Secondary | ICD-10-CM | POA: Diagnosis not present

## 2023-04-26 DIAGNOSIS — M961 Postlaminectomy syndrome, not elsewhere classified: Secondary | ICD-10-CM | POA: Insufficient documentation

## 2023-04-26 DIAGNOSIS — G8929 Other chronic pain: Secondary | ICD-10-CM | POA: Insufficient documentation

## 2023-04-26 DIAGNOSIS — M5416 Radiculopathy, lumbar region: Secondary | ICD-10-CM | POA: Diagnosis not present

## 2023-04-26 DIAGNOSIS — G894 Chronic pain syndrome: Secondary | ICD-10-CM | POA: Insufficient documentation

## 2023-04-26 NOTE — Progress Notes (Signed)
PROVIDER NOTE: Information contained herein reflects review and annotations entered in association with encounter. Interpretation of such information and data should be left to medically-trained personnel. Information provided to patient can be located elsewhere in the medical record under "Patient Instructions". Document created using STT-dictation technology, any transcriptional errors that may result from process are unintentional.    Patient: Seth Stewart.  Service Category: E/M  Provider: Edward Jolly, MD  DOB: 1940/12/01  DOS: 04/26/2023  Referring Provider: Duanne Limerick, MD  MRN: [id number]  Specialty: Interventional Pain Management  PCP: Duanne Limerick, MD  Type: Established Patient  Setting: Ambulatory outpatient    Location: Office  Delivery: Face-to-face     HPI  Mr. Seth Stewart., a 82 y.o. year old male, is here today because of his Chronic radicular lumbar pain [M54.16, G89.29]. Mr. Lieske primary complain today is Back Pain  Pain Assessment: Severity of Chronic pain is reported as a 2 /10. Location: Back Mid/to lower right and left. Onset: More than a month ago. Quality: Dull. Timing: Intermittent. Modifying factor(s): rest, procedure. Vitals:  height is 6\' 3"  (1.905 m) and weight is 207 lb (93.9 kg). His temperature is 97.2 F (36.2 C) (abnormal). His blood pressure is 126/76 and his pulse is 51 (abnormal). His respiration is 16 and oxygen saturation is 94%.  BMI: Estimated body mass index is 25.87 kg/m as calculated from the following:   Height as of this encounter: 6\' 3"  (1.905 m).   Weight as of this encounter: 207 lb (93.9 kg). Last encounter: 03/04/2023. Last procedure: 03/22/2023.  Reason for encounter: post-procedure evaluation and assessment.    Post-procedure evaluation   Inter-Laminar Thoracic Epidural Steroid Block/Injection  #1  Laterality:  Midline Level: T11-12  Imaging: Fluoroscopic guidance Anesthesia: Local anesthesia (1-2%  Lidocaine) Anxiolysis: None Sedation: None.  DOS: 03/22/2023 Performed by: Edward Jolly, MD  Purpose: Diagnostic/Therapeutic Indications: Thoracic back pain, radicular pain, with degenerative disc disease severe enough to impact quality of life or function. 1. Hx of decompressive lumbar laminectomy (L1-S1)   2. Chronic radicular lumbar pain   3. Failed back surgical syndrome    NAS-11 Pain score:   Pre-procedure: 5 /10   Post-procedure: 0-No pain/10      Effectiveness:  Initial hour after procedure: 75 %  Subsequent 4-6 hours post-procedure: 75 %  Analgesia past initial 6 hours: 50 % (Still some pain when pt walks,and when leaning over countertops; has not required pain meds since procedure; ability to play golf without pain "is much better")  Ongoing improvement:  Analgesic:  50% Function: Mr. Esty reports improvement in function ROM: Mr. Mcqueary reports improvement in ROM   ROS  Constitutional: Denies any fever or chills Gastrointestinal: No reported hemesis, hematochezia, vomiting, or acute GI distress Musculoskeletal: Denies any acute onset joint swelling, redness, loss of ROM, or weakness Neurological: No reported episodes of acute onset apraxia, aphasia, dysarthria, agnosia, amnesia, paralysis, loss of coordination, or loss of consciousness  Medication Review  Flaxseed (Linseed), Lactobacillus, Omega-3, amiodarone, aspirin EC, metoprolol tartrate, multivitamin with minerals, pantoprazole, rosuvastatin, tadalafil, vitamin C, and vitamin E  History Review  Allergy: Mr. Ashmead has No Known Allergies. Drug: Mr. Conrow  reports no history of drug use. Alcohol:  reports current alcohol use of about 10.0 standard drinks of alcohol per week. Tobacco:  reports that he has never smoked. He has never used smokeless tobacco. Social: Mr. Feist  reports that he has never smoked. He has never used smokeless tobacco. He  reports current alcohol use of about 10.0 standard drinks of  alcohol per week. He reports that he does not use drugs. Medical:  has a past medical history of AF (atrial fibrillation) (HCC), Back pain, Dysphagia, Dysrhythmia, Hemorrhoids, Hyperlipidemia, and Hypertension. Surgical: Mr. Mobbs  has a past surgical history that includes Eye surgery (09/21/2009); Laminectomy (09/21/2002); Colonoscopy (12/06/2014); Ablation; Hernia repair (Right, 01/07/2015); Esophagogastroduodenoscopy (N/A, 10/29/2022); Back surgery; Esophagogastroduodenoscopy; and Esophagogastroduodenoscopy (egd) with propofol (N/A, 12/04/2022). Family: family history includes Cancer in his mother; Heart attack in his father and paternal uncle; Heart disease in his father.  Laboratory Chemistry Profile   Renal Lab Results  Component Value Date   BUN 12 01/25/2023   CREATININE 0.91 01/25/2023   BCR 13 01/25/2023   GFRAA >60 05/09/2019   GFRNONAA >60 05/09/2019    Hepatic Lab Results  Component Value Date   AST 22 01/25/2023   ALT 12 01/25/2023   ALBUMIN 4.3 01/25/2023   ALKPHOS 48 01/25/2023    Electrolytes Lab Results  Component Value Date   NA 135 01/25/2023   K 4.2 01/25/2023   CL 105 01/25/2023   CALCIUM 9.4 01/25/2023   PHOS 3.3 11/11/2015    Bone No results found for: "VD25OH", "VD125OH2TOT", "JY7829FA2", "ZH0865HQ4", "25OHVITD1", "25OHVITD2", "25OHVITD3", "TESTOFREE", "TESTOSTERONE"  Inflammation (CRP: Acute Phase) (ESR: Chronic Phase) No results found for: "CRP", "ESRSEDRATE", "LATICACIDVEN"       Note: Above Lab results reviewed.    Physical Exam  General appearance: Well nourished, well developed, and well hydrated. In no apparent acute distress Mental status: Alert, oriented x 3 (person, place, & time)       Respiratory: No evidence of acute respiratory distress Eyes: PERLA Vitals: BP 126/76   Pulse (!) 51   Temp (!) 97.2 F (36.2 C)   Resp 16   Ht 6\' 3"  (1.905 m)   Wt 207 lb (93.9 kg)   SpO2 94%   BMI 25.87 kg/m  BMI: Estimated body mass index is  25.87 kg/m as calculated from the following:   Height as of this encounter: 6\' 3"  (1.905 m).   Weight as of this encounter: 207 lb (93.9 kg). Ideal: Ideal body weight: 84.5 kg (186 lb 4.6 oz) Adjusted ideal body weight: 88.3 kg (194 lb 9.2 oz)  Improvement in low back pain and range of motion.  Assessment   Diagnosis Status  1. Chronic radicular lumbar pain   2. Failed back surgical syndrome   3. Radicular pain of thoracic region   4. Chronic pain syndrome    Improved Controlled Controlled   Updated Problems: No problems updated.  Plan of Care  Positive response after T12-L1 epidural steroid injection.  Encouraged patient to continue and monitor his symptoms.  As needed order placed below for repeating ESI should he have increased pain.  Can repeat anytime after October 1.  Future considerations include spinal cord stimulator trial.  Orders:  Orders Placed This Encounter  Procedures   Lumbar Epidural Injection    Standing Status:   Standing    Number of Occurrences:   2    Standing Expiration Date:   04/25/2024    Scheduling Instructions:     Procedure: Interlaminar Lumbar Epidural Steroid injection (LESI) ; T12/L1          Laterality: Midline     Sedation: Patient's choice.     Timeframe: ASAA    Order Specific Question:   Where will this procedure be performed?    Answer:   [facility]   Follow-up plan:   Return for patient will call to schedule T12/L1 ESI prn.      T11/T12 ESI (hx of L1-L5 decompression)     Recent Visits Date Type Provider Dept  03/22/23 Procedure visit Edward Jolly, MD [facility]  03/04/23 Office Visit Edward Jolly, MD [facility]  Showing recent visits within past 90 days and meeting all other requirements Today's Visits Date Type Provider Dept  04/26/23 Office Visit Edward Jolly, MD [facility]  Showing today's visits and meeting all other requirements Future Appointments No visits were found  meeting these conditions. Showing future appointments within next 90 days and meeting all other requirements  I discussed the assessment and treatment plan with the patient. The patient was provided an opportunity to ask questions and all were answered. The patient agreed with the plan and demonstrated an understanding of the instructions.  Patient advised to call back or seek an in-person evaluation if the symptoms or condition worsens.  Duration of encounter: 15 minutes.  Total time on encounter, as per AMA guidelines included both the face-to-face and non-face-to-face time personally spent by the physician and/or other qualified health care professional(s) on the day of the encounter (includes time in activities that require the physician or other qualified health care professional and does not include time in activities normally performed by clinical staff). Physician's time may include the following activities when performed: Preparing to see the patient (e.g., pre-charting review of records, searching for previously ordered imaging, lab work, and nerve conduction tests) Review of prior analgesic pharmacotherapies. Reviewing PMP Interpreting ordered tests (e.g., lab work, imaging, nerve conduction tests) Performing post-procedure evaluations, including interpretation of diagnostic procedures Obtaining and/or reviewing separately obtained history Performing a medically appropriate examination and/or evaluation Counseling and educating the patient/family/caregiver Ordering medications, tests, or procedures Referring and communicating with other health care professionals (when not separately reported) Documenting clinical information in the electronic or other health record Independently interpreting results (not separately reported) and communicating results to the patient/ family/caregiver Care coordination (not separately reported)  Note by: Edward Jolly, MD Date: 04/26/2023; Time: 3:42  PM

## 2023-04-26 NOTE — Patient Instructions (Signed)

## 2023-04-26 NOTE — Progress Notes (Signed)
Safety precautions to be maintained throughout the outpatient stay will include: orient to surroundings, keep bed in low position, maintain call bell within reach at all times, provide assistance with transfer out of bed and ambulation.  

## 2023-04-29 DIAGNOSIS — Z23 Encounter for immunization: Secondary | ICD-10-CM | POA: Diagnosis not present

## 2023-04-29 DIAGNOSIS — Z7184 Encounter for health counseling related to travel: Secondary | ICD-10-CM | POA: Diagnosis not present

## 2023-05-04 DIAGNOSIS — Z23 Encounter for immunization: Secondary | ICD-10-CM | POA: Diagnosis not present

## 2023-06-04 ENCOUNTER — Other Ambulatory Visit: Payer: Self-pay | Admitting: Cardiovascular Disease

## 2023-06-09 DIAGNOSIS — H5051 Esophoria: Secondary | ICD-10-CM | POA: Diagnosis not present

## 2023-06-09 DIAGNOSIS — H35373 Puckering of macula, bilateral: Secondary | ICD-10-CM | POA: Diagnosis not present

## 2023-06-09 DIAGNOSIS — H532 Diplopia: Secondary | ICD-10-CM | POA: Diagnosis not present

## 2023-06-09 DIAGNOSIS — Z961 Presence of intraocular lens: Secondary | ICD-10-CM | POA: Diagnosis not present

## 2023-06-16 DIAGNOSIS — D0461 Carcinoma in situ of skin of right upper limb, including shoulder: Secondary | ICD-10-CM | POA: Diagnosis not present

## 2023-06-16 DIAGNOSIS — D2262 Melanocytic nevi of left upper limb, including shoulder: Secondary | ICD-10-CM | POA: Diagnosis not present

## 2023-06-16 DIAGNOSIS — D485 Neoplasm of uncertain behavior of skin: Secondary | ICD-10-CM | POA: Diagnosis not present

## 2023-06-16 DIAGNOSIS — L820 Inflamed seborrheic keratosis: Secondary | ICD-10-CM | POA: Diagnosis not present

## 2023-06-16 DIAGNOSIS — L570 Actinic keratosis: Secondary | ICD-10-CM | POA: Diagnosis not present

## 2023-06-16 DIAGNOSIS — D225 Melanocytic nevi of trunk: Secondary | ICD-10-CM | POA: Diagnosis not present

## 2023-06-16 DIAGNOSIS — Z86006 Personal history of melanoma in-situ: Secondary | ICD-10-CM | POA: Diagnosis not present

## 2023-06-16 DIAGNOSIS — D2261 Melanocytic nevi of right upper limb, including shoulder: Secondary | ICD-10-CM | POA: Diagnosis not present

## 2023-06-16 DIAGNOSIS — D2272 Melanocytic nevi of left lower limb, including hip: Secondary | ICD-10-CM | POA: Diagnosis not present

## 2023-06-16 DIAGNOSIS — Z85828 Personal history of other malignant neoplasm of skin: Secondary | ICD-10-CM | POA: Diagnosis not present

## 2023-06-28 ENCOUNTER — Ambulatory Visit
Admission: RE | Admit: 2023-06-28 | Discharge: 2023-06-28 | Disposition: A | Discharge status: Home / Self Care | Payer: Medicare Other | Source: Ambulatory Visit | Attending: Student in an Organized Health Care Education/Training Program | Admitting: Student in an Organized Health Care Education/Training Program

## 2023-06-28 ENCOUNTER — Encounter: Payer: Self-pay | Admitting: Student in an Organized Health Care Education/Training Program

## 2023-06-28 ENCOUNTER — Ambulatory Visit
Payer: Medicare Other | Attending: Student in an Organized Health Care Education/Training Program | Admitting: Student in an Organized Health Care Education/Training Program

## 2023-06-28 VITALS — BP 147/84 | HR 79 | Temp 97.2°F | Resp 16 | Ht 75.0 in | Wt 205.0 lb

## 2023-06-28 DIAGNOSIS — G894 Chronic pain syndrome: Secondary | ICD-10-CM | POA: Diagnosis not present

## 2023-06-28 DIAGNOSIS — M5416 Radiculopathy, lumbar region: Secondary | ICD-10-CM | POA: Diagnosis not present

## 2023-06-28 DIAGNOSIS — M5414 Radiculopathy, thoracic region: Secondary | ICD-10-CM | POA: Insufficient documentation

## 2023-06-28 DIAGNOSIS — G8929 Other chronic pain: Secondary | ICD-10-CM | POA: Diagnosis not present

## 2023-06-28 MED ORDER — DEXAMETHASONE SODIUM PHOSPHATE 10 MG/ML IJ SOLN
INTRAMUSCULAR | Status: AC
  Filled 2023-06-28: qty 1

## 2023-06-28 MED ORDER — IOHEXOL 180 MG/ML  SOLN
INTRAMUSCULAR | Status: AC
  Filled 2023-06-28: qty 20

## 2023-06-28 MED ORDER — ROPIVACAINE HCL 2 MG/ML IJ SOLN
INTRAMUSCULAR | Status: AC
  Filled 2023-06-28: qty 20

## 2023-06-28 MED ORDER — SODIUM CHLORIDE 0.9% FLUSH
2.0000 mL | Freq: Once | INTRAVENOUS | Status: AC
  Administered 2023-06-28: 2 mL

## 2023-06-28 MED ORDER — SODIUM CHLORIDE (PF) 0.9 % IJ SOLN
INTRAMUSCULAR | Status: AC
  Filled 2023-06-28: qty 10

## 2023-06-28 MED ORDER — LIDOCAINE HCL (PF) 2 % IJ SOLN
INTRAMUSCULAR | Status: AC
  Filled 2023-06-28: qty 10

## 2023-06-28 MED ORDER — LIDOCAINE HCL 2 % IJ SOLN
20.0000 mL | Freq: Once | INTRAMUSCULAR | Status: AC
  Administered 2023-06-28: 200 mg

## 2023-06-28 MED ORDER — ROPIVACAINE HCL 2 MG/ML IJ SOLN
2.0000 mL | Freq: Once | INTRAMUSCULAR | Status: AC
  Administered 2023-06-28: 2 mL via EPIDURAL

## 2023-06-28 MED ORDER — IOHEXOL 180 MG/ML  SOLN
10.0000 mL | Freq: Once | INTRAMUSCULAR | Status: AC
  Administered 2023-06-28: 10 mL via EPIDURAL

## 2023-06-28 MED ORDER — DEXAMETHASONE SODIUM PHOSPHATE 10 MG/ML IJ SOLN
10.0000 mg | Freq: Once | INTRAMUSCULAR | Status: AC
  Administered 2023-06-28: 10 mg

## 2023-06-28 NOTE — Patient Instructions (Signed)

## 2023-06-28 NOTE — Progress Notes (Signed)
PROVIDER NOTE: Interpretation of information contained herein should be left to medically-trained personnel. Specific patient instructions are provided elsewhere under "Patient Instructions" section of medical record. This document was created in part using STT-dictation technology, any transcriptional errors that may result from this process are unintentional.  Patient: Seth Mawhinney Sr. Type: Established DOB: [birth year] MRN: [id number] PCP: Duanne Limerick, MD  Service: Procedure DOS: 06/28/2023 Setting: Ambulatory Location: Ambulatory outpatient facility Delivery: Face-to-face Provider: Edward Jolly, MD Specialty: Interventional Pain Management Specialty designation: 09 Location: Outpatient facility Ref. Prov.: Duanne Limerick, MD       Interventional Therapy   Primary Reason for Visit: Interventional Pain Management Treatment. CC: Back Pain (lower)  Procedure:           Inter-Laminar Thoracic Epidural Steroid Block/Injection  #1  Laterality:  Midline Level: T11-12  Imaging: Fluoroscopic guidance Anesthesia: Local anesthesia (1-2% Lidocaine) Anxiolysis: None Sedation: None.  DOS: 06/28/2023 Performed by: Edward Jolly, MD  Purpose: Diagnostic/Therapeutic Indications: Thoracic back pain, radicular pain, with degenerative disc disease severe enough to impact quality of life or function. 1. Chronic radicular lumbar pain   2. Radicular pain of thoracic region   3. Chronic pain syndrome    NAS-11 Pain score:   Pre-procedure: 6 /10   Post-procedure: 6 /10     Position / Prep / Materials:  Position: Prone  Prep solution: DuraPrep (Iodine Povacrylex [0.7% available iodine] and Isopropyl Alcohol, 74% w/w) Prep Area: Posterior Thoracolumbar (Upper back from shoulders to lower lumbar region).  Materials:  Tray: Epidural Needle(s) Type: Epidural needle Gauge (G): 22 Length: Regular (3.5-in) Qty: 1  H&P (Pre-op Assessment):  Seth Stewart is a 82 y.o. (year old), male  patient, seen today for interventional treatment. He  has a past surgical history that includes Eye surgery (09/21/2009); Laminectomy (09/21/2002); Colonoscopy (12/06/2014); Ablation; Hernia repair (Right, 01/07/2015); Esophagogastroduodenoscopy (N/A, 10/29/2022); Back surgery; Esophagogastroduodenoscopy; and Esophagogastroduodenoscopy (egd) with propofol (N/A, 12/04/2022). [name] has a current medication list which includes the following prescription(s): amiodarone, vitamin c, aspirin ec, flaxseed (linseed), lactobacillus, metoprolol tartrate, multivitamin with minerals, omega-3, rosuvastatin, tadalafil, vitamin e, and pantoprazole. His primarily concern today is the Back Pain (lower)  Initial Vital Signs:  Pulse/HCG Rate: 79ECG Heart Rate: 80 Temp: (!) 97.2 F (36.2 C) Resp: 14 BP: (!) 144/98 SpO2: 99 %  BMI: Estimated body mass index is 25.62 kg/m as calculated from the following:   Height as of this encounter: 6\' 3"  (1.905 m).   Weight as of this encounter: 205 lb (93 kg).  Risk Assessment: Allergies: Reviewed. He has No Known Allergies.  Allergy Precautions: None required Coagulopathies: Reviewed. None identified.  Blood-thinner therapy: None at this time Active Infection(s): Reviewed. None identified. Seth Stewart is afebrile  Site Confirmation: Mr. Newborn was asked to confirm the procedure and laterality before marking the site Procedure checklist: Completed Consent: Before the procedure and under the influence of no sedative(s), amnesic(s), or anxiolytics, the patient was informed of the treatment options, risks and possible complications. To fulfill our ethical and legal obligations, as recommended by the American Medical Association's Code of Ethics, I have informed the patient of my clinical impression; the nature and purpose of the treatment or procedure; the risks, benefits, and possible complications of the intervention; the alternatives, including doing nothing; the risk(s)  and benefit(s) of the alternative treatment(s) or procedure(s); and the risk(s) and benefit(s) of doing nothing. The patient was provided information about the general risks and possible complications associated with the procedure. These may  include, but are not limited to: failure to achieve desired goals, infection, bleeding, organ or nerve damage, allergic reactions, paralysis, and death. In addition, the patient was informed of those risks and complications associated to Spine-related procedures, such as failure to decrease pain; infection (i.e.: Meningitis, epidural or intraspinal abscess); bleeding (i.e.: epidural hematoma, subarachnoid hemorrhage, or any other type of intraspinal or peri-dural bleeding); organ or nerve damage (i.e.: Any type of peripheral nerve, nerve root, or spinal cord injury) with subsequent damage to sensory, motor, and/or autonomic systems, resulting in permanent pain, numbness, and/or weakness of one or several areas of the body; allergic reactions; (i.e.: anaphylactic reaction); and/or death. Furthermore, the patient was informed of those risks and complications associated with the medications. These include, but are not limited to: allergic reactions (i.e.: anaphylactic or anaphylactoid reaction(s)); adrenal axis suppression; blood sugar elevation that in diabetics may result in ketoacidosis or comma; water retention that in patients with history of congestive heart failure may result in shortness of breath, pulmonary edema, and decompensation with resultant heart failure; weight gain; swelling or edema; medication-induced neural toxicity; particulate matter embolism and blood vessel occlusion with resultant organ, and/or nervous system infarction; and/or aseptic necrosis of one or more joints. Finally, the patient was informed that Medicine is not an exact science; therefore, there is also the possibility of unforeseen or unpredictable risks and/or possible complications that  may result in a catastrophic outcome. The patient indicated having understood very clearly. We have given the patient no guarantees and we have made no promises. Enough time was given to the patient to ask questions, all of which were answered to the patient's satisfaction. Mr. Letendre has indicated that he wanted to continue with the procedure. Attestation: I, the ordering provider, attest that I have discussed with the patient the benefits, risks, side-effects, alternatives, likelihood of achieving goals, and potential problems during recovery for the procedure that I have provided informed consent. Date  Time: 06/28/2023  9:25 AM   Pre-Procedure Preparation:  Monitoring: As per clinic protocol. Respiration, ETCO2, SpO2, BP, heart rate and rhythm monitor placed and checked for adequate function Safety Precautions: Patient was assessed for positional comfort and pressure points before starting the procedure. Time-out: I initiated and conducted the "Time-out" before starting the procedure, as per protocol. The patient was asked to participate by confirming the accuracy of the "Time Out" information. Verification of the correct person, site, and procedure were performed and confirmed by me, the nursing staff, and the patient. "Time-out" conducted as per Joint Commission's Universal Protocol (UP.01.01.01). Time: 1001 Start Time: 1001 hrs.  Description of Procedure:          Target Area: For Epidural Steroid injection(s), the target area is the  interlaminar space, initially targeting the lower border of the superior vertebral body lamina. Approach: Interlaminar approach. Area Prepped: Entire Posterior Thoracolumbar Region DuraPrep (Iodine Povacrylex [0.7% available iodine] and Isopropyl Alcohol, 74% w/w) Safety Precautions: Aspiration looking for blood return was conducted prior to all injections. At no point did we inject any substances, as a needle was being advanced. No attempts were made at seeking  any paresthesias. Safe injection practices and needle disposal techniques used. Medications properly checked for expiration dates. SDV (single dose vial) medications used. Description of the Procedure: Protocol guidelines were followed. The patient was placed in position over the fluoroscopy table. The target area was identified and the area prepped in the usual manner. Skin & deeper tissues infiltrated with local anesthetic. Appropriate amount of time allowed  to pass for local anesthetics to take effect. The procedure needles were then advanced to the target area. The inferior aspect of the superior lamina was contacted and the needle walked caudad, until the lamina was cleared. The epidural space was identified using "loss-of-resistance technique" with 0.9% PF-NSS (2-4mL), in a low friction 10cc LOR glass syringe. Proper needle placement was secured. Negative aspiration confirmed. Solution injected in intermittent fashion, asking for systemic symptoms every 0.5 cc of injectate. The needles were then removed and the area cleansed, making sure to leave some of the prepping solution behind to take advantage of its long term bactericidal properties. Vitals:   06/28/23 0935 06/28/23 0956 06/28/23 1002 06/28/23 1006  BP: (!) 144/98 (!) 167/79 138/81 (!) 147/84  Pulse: 79     Resp: 14 16 13 16   Temp: (!) 97.2 F (36.2 C)     TempSrc: Temporal     SpO2: 99% 98% 98% 97%  Weight: 205 lb (93 kg)     Height: 6\' 3"  (1.905 m)       Start Time: 1001 hrs. End Time: 1004 hrs. Imaging Guidance (Spinal):          Type of Imaging Technique: Fluoroscopy Guidance (Spinal) Indication(s): Assistance in needle guidance and placement for procedures requiring needle placement in or near specific anatomical locations not easily accessible without such assistance. Exposure Time: Please see nurses notes. Contrast: Before injecting any contrast, we confirmed that the patient did not have an allergy to iodine, shellfish, or  radiological contrast. Once satisfactory needle placement was completed at the desired level, radiological contrast was injected. Contrast injected under live fluoroscopy. No contrast complications. See chart for type and volume of contrast used. Fluoroscopic Guidance: I was personally present during the use of fluoroscopy. "Tunnel Vision Technique" used to obtain the best possible view of the target area. Parallax error corrected before commencing the procedure. "Direction-depth-direction" technique used to introduce the needle under continuous pulsed fluoroscopy. Once target was reached, antero-posterior, oblique, and lateral fluoroscopic projection used confirm needle placement in all planes. Images permanently stored in EMR. Interpretation: I personally interpreted the imaging intraoperatively. Adequate needle placement confirmed in multiple planes. Appropriate spread of contrast into desired area was observed. No evidence of afferent or efferent intravascular uptake. No intrathecal or subarachnoid spread observed. Permanent images saved into the patient's record.  Antibiotic Prophylaxis:   Anti-infectives (From admission, onward)    None      Indication(s): None identified  Post-operative Assessment:  Post-procedure Vital Signs:  Pulse/HCG Rate: 7970 Temp: (!) 97.2 F (36.2 C) Resp: 16 BP: (!) 147/84 SpO2: 97 %  EBL: None  Complications: No immediate post-treatment complications observed by team, or reported by patient.  Note: The patient tolerated the entire procedure well. A repeat set of vitals were taken after the procedure and the patient was kept under observation following institutional policy, for this type of procedure. Post-procedural neurological assessment was performed, showing return to baseline, prior to discharge. The patient was provided with post-procedure discharge instructions, including a section on how to identify potential problems. Should any problems arise  concerning this procedure, the patient was given instructions to immediately contact us, at any time, without hesitation. In any case, we plan to contact the patient by telephone for a follow-up status report regarding this interventional procedure.  Comments:  No additional relevant information.  Plan of Care (POC)  Orders:  Orders Placed This Encounter  Procedures   DG PAIN [facility] C-ARM 1-60 MIN NO REPORT  Intraoperative interpretation by procedural physician at [facility].    Standing Status:   Standing    Number of Occurrences:   1    Order Specific Question:   Reason for exam:    Answer:   Assistance in needle guidance and placement for procedures requiring needle placement in or near specific anatomical locations not easily accessible without such assistance.     Medications ordered for procedure: Meds ordered this encounter  Medications   iohexol (OMNIPAQUE) 180 MG/ML injection 10 mL    Must be Myelogram-compatible. If not available, you may substitute with a water-soluble, non-ionic, hypoallergenic, myelogram-compatible radiological contrast medium.   lidocaine (XYLOCAINE) 2 % (with pres) injection 400 mg   dexamethasone (DECADRON) injection 10 mg   ropivacaine (PF) 2 mg/mL (0.2%) (NAROPIN) injection 2 mL   sodium chloride flush (NS) 0.9 % injection 2 mL   Medications administered: We administered iohexol, lidocaine, dexamethasone, ropivacaine (PF) 2 mg/mL (0.2%), and sodium chloride flush.  See the medical record for exact dosing, route, and time of administration.  Follow-up plan:   Return in about 4 weeks (around 07/26/2023) for PPE, F2F.       T11/T12 ESI (hx of L1-L5 decompression)    Recent Visits Date Type Provider Dept  04/26/23 Office Visit Edward Jolly, MD [facility]  Showing recent visits within past 90 days and meeting all other requirements Today's Visits Date Type Provider Dept  06/28/23 Procedure visit Edward Jolly, MD  [facility]  Showing today's visits and meeting all other requirements Future Appointments No visits were found meeting these conditions. Showing future appointments within next 90 days and meeting all other requirements  Disposition: Discharge home  Discharge (Date  Time): 06/28/2023; 1006 hrs.   Primary Care Physician: Duanne Limerick, MD Location: [facility] Note by: Edward Jolly, MD (TTS technology used. I apologize for any typographical errors that were not detected and corrected.) Date: 06/28/2023; Time: 10:08 AM  Disclaimer:  Medicine is not an Visual merchandiser. The only guarantee in medicine is that nothing is guaranteed. It is important to note that the decision to proceed with this intervention was based on the information collected from the patient. The Data and conclusions were drawn from the patient's questionnaire, the interview, and the physical examination. Because the information was provided in large part by the patient, it cannot be guaranteed that it has not been purposely or unconsciously manipulated. Every effort has been made to obtain as much relevant data as possible for this evaluation. It is important to note that the conclusions that lead to this procedure are derived in large part from the available data. Always take into account that the treatment will also be dependent on availability of resources and existing treatment guidelines, considered by other Pain Management Practitioners as being common knowledge and practice, at the time of the intervention. For Medico-Legal purposes, it is also important to point out that variation in procedural techniques and pharmacological choices are the acceptable norm. The indications, contraindications, technique, and results of the above procedure should only be interpreted and judged by a Board-Certified Interventional Pain Specialist with extensive familiarity and expertise in the same exact  procedure and technique.

## 2023-06-30 ENCOUNTER — Telehealth: Payer: Self-pay

## 2023-06-30 NOTE — Telephone Encounter (Signed)
Post procedure follow up.  LM 

## 2023-07-02 ENCOUNTER — Other Ambulatory Visit: Payer: Self-pay | Admitting: Cardiovascular Disease

## 2023-07-02 NOTE — Telephone Encounter (Signed)
Hi,  Could you please schedule this patient a 6 month follow up visit? The patient was last seen by Dr. Kirke Corin on 01-05-2023. Thank you so much.

## 2023-07-29 ENCOUNTER — Encounter: Payer: Self-pay | Admitting: Family Medicine

## 2023-07-29 ENCOUNTER — Ambulatory Visit (INDEPENDENT_AMBULATORY_CARE_PROVIDER_SITE_OTHER): Payer: Medicare Other | Admitting: Family Medicine

## 2023-07-29 VITALS — BP 122/62 | HR 72 | Ht 75.0 in | Wt 211.0 lb

## 2023-07-29 DIAGNOSIS — E782 Mixed hyperlipidemia: Secondary | ICD-10-CM

## 2023-07-29 MED ORDER — ROSUVASTATIN CALCIUM 5 MG PO TABS
5.0000 mg | ORAL_TABLET | Freq: Every day | ORAL | 1 refills | Status: DC
Start: 2023-07-29 — End: 2023-10-12

## 2023-07-29 NOTE — Progress Notes (Signed)
Date:  07/29/2023   Name:  Seth Dulude Sr.   DOB:  [birth year]   MRN:  [id number]   Chief Complaint: Hyperlipidemia and Hypertension  Hyperlipidemia This is a chronic problem. The current episode started more than 1 year ago. The problem is controlled. Recent lipid tests were reviewed and are normal. He has no history of chronic renal disease, diabetes, hypothyroidism, liver disease, obesity or nephrotic syndrome. There are no known factors aggravating his hyperlipidemia. Pertinent negatives include no chest pain or shortness of breath. Current antihyperlipidemic treatment includes statins. The current treatment provides moderate improvement of lipids. There are no compliance problems.  Risk factors for coronary artery disease include hypertension.  Hypertension This is a chronic problem. The current episode started more than 1 year ago. The problem has been gradually improving since onset. The problem is controlled. Pertinent negatives include no anxiety, blurred vision, chest pain, headaches, malaise/fatigue, neck pain, orthopnea, palpitations, peripheral edema, PND, shortness of breath or sweats. There are no associated agents to hypertension. Risk factors for coronary artery disease include dyslipidemia. There is no history of chronic renal disease.    Lab Results  Component Value Date   NA 135 01/25/2023   K 4.2 01/25/2023   CO2 21 01/25/2023   GLUCOSE 86 01/25/2023   BUN 12 01/25/2023   CREATININE 0.91 01/25/2023   CALCIUM 9.4 01/25/2023   EGFR 84 01/25/2023   GFRNONAA >60 05/09/2019   Lab Results  Component Value Date   CHOL 194 01/22/2022   HDL 77 01/22/2022   LDLCALC 100 (H) 01/22/2022   LDLDIRECT 121 (H) 01/25/2023   TRIG 94 01/22/2022   CHOLHDL 2.4 11/23/2016   No results found for: "TSH" No results found for: "HGBA1C" Lab Results  Component Value Date   WBC 8.9 05/09/2019   HGB 13.6 05/09/2019   HCT 39.3 05/09/2019   MCV 92.9 05/09/2019   PLT 190  05/09/2019   Lab Results  Component Value Date   ALT 12 01/25/2023   AST 22 01/25/2023   ALKPHOS 48 01/25/2023   BILITOT 0.9 01/25/2023   No results found for: "25OHVITD2", "25OHVITD3", "VD25OH"   Review of Systems  Constitutional:  Negative for malaise/fatigue.  HENT:  Negative for mouth sores, nosebleeds, postnasal drip and sinus pressure.   Eyes:  Negative for blurred vision, redness and visual disturbance.  Respiratory:  Negative for cough, chest tightness, shortness of breath, wheezing and stridor.   Cardiovascular:  Negative for chest pain, palpitations, orthopnea and PND.  Gastrointestinal:  Negative for abdominal pain and anal bleeding.  Endocrine: Negative for polydipsia and polyuria.  Musculoskeletal:  Negative for neck pain.  Neurological:  Negative for headaches.    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   Chronic radicular lumbar pain 03/04/2023   Chronic pain syndrome 03/04/2023   Trigger finger, right middle finger 05/13/2021   Trigger middle finger of left hand 05/13/2021   Leg cramps 09/07/2020   Frequent falls 04/02/2020   Low back pain 04/02/2020   Neuropathy 03/27/2020   Ataxia 01/25/2020   Imbalance 01/25/2020   Benign prostatic hyperplasia without lower urinary tract symptoms 12/13/2019   Prostate cancer screening 12/13/2019   Chest pain, atypical 06/16/2019   Right inguinal hernia 12/31/2014   Atrial fibrillation (HCC) 05/03/2014   HLD (hyperlipidemia) 05/03/2014   Lumbar canal stenosis 04/12/2012    No Known Allergies  Past Surgical History:  Procedure Laterality Date   ABLATION     BACK SURGERY     COLONOSCOPY  12/06/2014   [name]   ESOPHAGOGASTRODUODENOSCOPY N/A 10/29/2022   Procedure: ESOPHAGOGASTRODUODENOSCOPY (EGD);  Surgeon: Jaynie Collins, DO;  [facility] END[facility]COPY;  Service: Gastroenterology;  Laterality: N/A;    ESOPHAGOGASTRODUODENOSCOPY     ESOPHAGOGASTRODUODENOSCOPY (EGD) WITH PROPOFOL N/A 12/04/2022   Procedure: ESOPHAGOGASTRODUODENOSCOPY (EGD) WITH PROPOFOL;  Surgeon: Jaynie Collins, DO;  Location[facility] ENDOSCOPY;  Service: Gastroenterology;  Laterality: N/A;   EYE SURGERY  09/21/2009   HERNIA REPAIR Right 01/07/2015   right inguinal repair, medium ultra Pro mesh   LAMINECTOMY  09/21/2002    Social History   Tobacco Use   Smoking status: Never   Smokeless tobacco: Never  Vaping Use   Vaping status: Never Used  Substance Use Topics   Alcohol use: Yes    Alcohol/week: 10.0 standard drinks of alcohol    Types: 10 Standard drinks or equivalent per week    Comment: beer with dinner last night   Drug use: Never     Medication list has been reviewed and updated.  Current Meds  Medication Sig   amiodarone (PACERONE) 100 MG tablet Take 100 mg by mouth daily.   Ascorbic Acid (VITAMIN C) 1000 MG tablet Take 1,000 mg by mouth daily.   aspirin 81 MG EC tablet Take 81 mg by mouth daily.   Flaxseed, Linseed, (FLAX SEEDS PO) Take 2 Scoops by mouth daily.   Lactobacillus (PROBIOTIC ACIDOPHILUS PO) Take 1 capsule by mouth.   metoprolol tartrate (LOPRESSOR) 25 MG tablet TAKE 1 TABLET BY MOUTH TWICE DAILY   Multiple Vitamins-Minerals (MULTIVITAMIN WITH MINERALS) tablet Take 1 tablet by mouth daily.   Omega-3 1000 MG CAPS Take 1 capsule by mouth 2 (two) times daily. otc   rosuvastatin (CRESTOR) 5 MG tablet Take 1 tablet (5 mg total) by mouth daily.   tadalafil (CIALIS) 20 MG tablet Take 20 mg by mouth daily as needed.   vitamin E 400 UNIT capsule Take 400 Units by mouth daily.       07/29/2023   10:44 AM 01/25/2023   11:04 AM 10/05/2022    4:01 PM 07/27/2022   10:32 AM  GAD 7 : Generalized Anxiety Score  Nervous, Anxious, on Edge 0 0 0 0  Control/stop worrying 0 0 0 0  Worry too much - different things 0 0 0 0  Trouble relaxing 0 0 0 0  Restless 0 0 0 0  Easily annoyed or irritable 0 0  0 0  Afraid - awful might happen 0 0 0 0  Total GAD 7 Score 0 0 0 0  Anxiety Difficulty Not difficult at all Not difficult at all Not difficult at all Not difficult at all       07/29/2023   10:44 AM 06/28/2023    9:38 AM 01/25/2023   11:04 AM  Depression screen PHQ 2/9  Decreased Interest 0 0 0  Down, Depressed, Hopeless 0 0 0  PHQ - 2 Score 0 0 0  Altered sleeping 2  0  Tired, decreased energy 0  0  Change in appetite 0  0  Feeling bad or failure about yourself  0  0  Trouble concentrating 0  0  Moving slowly or fidgety/restless 0  0  Suicidal thoughts 0  0  PHQ-9 Score 2  0  Difficult doing work/chores Not difficult at all  Not difficult at all    BP Readings from  Last 3 Encounters:  07/29/23 122/62  06/28/23 (!) 147/84  04/26/23 126/76    Physical Exam Vitals and nursing note reviewed.  HENT:     Head: Normocephalic.     Right Ear: Tympanic membrane and external ear normal.     Left Ear: Tympanic membrane and external ear normal.     Nose: Nose normal. No congestion or rhinorrhea.     Mouth/Throat:     Pharynx: No oropharyngeal exudate.  Eyes:     General: No scleral icterus.       Right eye: No discharge.        Left eye: No discharge.     Conjunctiva/sclera: Conjunctivae normal.     Pupils: Pupils are equal, round, and reactive to light.  Neck:     Thyroid: No thyromegaly.     Vascular: No JVD.     Trachea: No tracheal deviation.  Cardiovascular:     Rate and Rhythm: Normal rate and regular rhythm.     Heart sounds: Normal heart sounds. No murmur heard.    No friction rub. No gallop.  Pulmonary:     Effort: No respiratory distress.     Breath sounds: Normal breath sounds. No wheezing, rhonchi or rales.  Abdominal:     General: Bowel sounds are normal.     Palpations: Abdomen is soft. There is no mass.     Tenderness: There is no abdominal tenderness. There is no guarding or rebound.  Musculoskeletal:        General: No tenderness. Normal range of  motion.     Cervical back: Normal range of motion and neck supple.  Lymphadenopathy:     Cervical: No cervical adenopathy.  Skin:    General: Skin is warm.     Coloration: Skin is not pale.     Findings: No rash.  Neurological:     Mental Status: He is alert and oriented to person, place, and time.     Cranial Nerves: No cranial nerve deficit.     Sensory: No sensory deficit.     Deep Tendon Reflexes: Reflexes are normal and symmetric.     Wt Readings from Last 3 Encounters:  07/29/23 211 lb (95.7 kg)  06/28/23 205 lb (93 kg)  04/26/23 207 lb (93.9 kg)    BP 122/62   Pulse 72   Ht 6\' 3"  (1.905 m)   Wt 211 lb (95.7 kg)   SpO2 97%   BMI 26.37 kg/m   Assessment and Plan: 1. Mixed hyperlipidemia Chronic.  Controlled.  Stable.  Patient currently is on rosuvastatin 5 mg once a day and tolerating well without myalgias.  We will check lipid panel for current level of LDL control in the meantime likely continue on current dosing of Crestor. - Lipid Panel With LDL/HDL Ratio - rosuvastatin (CRESTOR) 5 MG tablet; Take 1 tablet (5 mg total) by mouth daily.  Dispense: 90 tablet; Refill: 1  Elizabeth Sauer, MD

## 2023-07-30 ENCOUNTER — Encounter: Payer: Self-pay | Admitting: Family Medicine

## 2023-07-30 LAB — LIPID PANEL WITH LDL/HDL RATIO
Cholesterol, Total: 194 mg/dL (ref 100–199)
HDL: 92 mg/dL (ref >39)
LDL Chol Calc (NIH): 90 mg/dL (ref 0–99)
LDL/HDL Ratio: 1 ratio (ref 0.0–3.6)
Triglycerides: 62 mg/dL (ref 0–149)
VLDL Cholesterol Cal: 12 mg/dL (ref 5–40)

## 2023-08-02 ENCOUNTER — Ambulatory Visit
Payer: Medicare Other | Attending: Student in an Organized Health Care Education/Training Program | Admitting: Student in an Organized Health Care Education/Training Program

## 2023-08-02 ENCOUNTER — Encounter: Payer: Self-pay | Admitting: Student in an Organized Health Care Education/Training Program

## 2023-08-02 VITALS — BP 114/68 | HR 103 | Temp 97.3°F | Resp 16 | Ht 75.0 in | Wt 208.0 lb

## 2023-08-02 DIAGNOSIS — M961 Postlaminectomy syndrome, not elsewhere classified: Secondary | ICD-10-CM | POA: Diagnosis not present

## 2023-08-02 DIAGNOSIS — G894 Chronic pain syndrome: Secondary | ICD-10-CM | POA: Diagnosis not present

## 2023-08-02 DIAGNOSIS — M5416 Radiculopathy, lumbar region: Secondary | ICD-10-CM | POA: Diagnosis not present

## 2023-08-02 DIAGNOSIS — M5414 Radiculopathy, thoracic region: Secondary | ICD-10-CM | POA: Diagnosis not present

## 2023-08-02 DIAGNOSIS — G8929 Other chronic pain: Secondary | ICD-10-CM | POA: Diagnosis not present

## 2023-08-02 DIAGNOSIS — Z9889 Other specified postprocedural states: Secondary | ICD-10-CM

## 2023-08-02 NOTE — Progress Notes (Signed)
PROVIDER NOTE: Information contained herein reflects review and annotations entered in association with encounter. Interpretation of such information and data should be left to medically-trained personnel. Information provided to patient can be located elsewhere in the medical record under "Patient Instructions". Document created using STT-dictation technology, any transcriptional errors that may result from process are unintentional.    Patient: Seth Clinton Sr.  Service Category: E/M  Provider: Edward Jolly, MD  DOB: [birth year]  DOS: 08/02/2023  Referring Provider: Duanne Limerick, MD  MRN: [id number]  Specialty: Interventional Pain Management  PCP: Duanne Limerick, MD  Type: Established Patient  Setting: Ambulatory outpatient    Location: Office  Delivery: Face-to-face     HPI  Mr. Seth Borak Sr., a 82 y.o. year old male, is here today because of his Chronic radicular lumbar pain [M54.16, G89.29]. Mr. Seth Stewart primary complain today is low back pain  Pain Assessment: Severity of Chronic pain is reported as a 0-No pain/10. Location: Back Upper, Mid/denies. Onset: More than a month ago. Quality: Discomfort. Timing: Intermittent. Modifying factor(s): rest or sitting down.. Vitals:  height is 6\' 3"  (1.905 m) and weight is 208 lb (94.3 kg). His temporal temperature is 97.3 F (36.3 C) (abnormal). His blood pressure is 114/68 and his pulse is 103 (abnormal). His respiration is 16 and oxygen saturation is 99%.  BMI: Estimated body mass index is 26 kg/m as calculated from the following:   Height as of this encounter: 6\' 3"  (1.905 m).   Weight as of this encounter: 208 lb (94.3 kg). Last encounter: 04/26/2023. Last procedure: 06/28/2023.  Reason for encounter:    Post-procedure evaluation   Inter-Laminar Thoracic Epidural Steroid Block/Injection  #1  Laterality:  Midline Level: T11-12  Imaging: Fluoroscopic guidance Anesthesia: Local anesthesia (1-2% Lidocaine) Anxiolysis:  None Sedation: None.  DOS: 06/28/2023 Performed by: Edward Jolly, MD  Purpose: Diagnostic/Therapeutic Indications: Thoracic back pain, radicular pain, with degenerative disc disease severe enough to impact quality of life or function. 1. Chronic radicular lumbar pain   2. Radicular pain of thoracic region   3. Chronic pain syndrome    NAS-11 Pain score:   Pre-procedure: 6 /10   Post-procedure: 6 /10      Effectiveness:  Initial hour after procedure: 0 %  Subsequent 4-6 hours post-procedure: 50 % (continues to have pain when walking, at the mile mark the pain becomes intense)  Analgesia past initial 6 hours: 50 %  Ongoing improvement:  Analgesic:  50% Function: Mr. Seth Stewart reports improvement in function ROM: Mr. Seth Stewart reports improvement in ROM    ROS  Constitutional: Denies any fever or chills Gastrointestinal: No reported hemesis, hematochezia, vomiting, or acute GI distress Musculoskeletal:  Improved low back pain, states that he can walk longer without pain or having to sit down and rest Neurological: No reported episodes of acute onset apraxia, aphasia, dysarthria, agnosia, amnesia, paralysis, loss of coordination, or loss of consciousness  Medication Review  Flaxseed (Linseed), Lactobacillus, Omega-3, amiodarone, aspirin EC, metoprolol tartrate, multivitamin with minerals, pantoprazole, rosuvastatin, tadalafil, vitamin C, and vitamin E  History Review  Allergy: Mr. Seth Stewart has No Known Allergies. Drug: Mr. Seth Stewart  reports no history of drug use. Alcohol:  reports current alcohol use of about 10.0 standard drinks of alcohol per week. Tobacco:  reports that he has never smoked. He has never used smokeless tobacco. Social: Mr. Seth Stewart  reports that he has never smoked. He has never used smokeless tobacco. He reports current alcohol use of about 10.0 standard  drinks of alcohol per week. He reports that he does not use drugs. Medical:  has a past medical history of AF (atrial  fibrillation) (HCC), Back pain, Dysphagia, Dysrhythmia, Hemorrhoids, Hyperlipidemia, and Hypertension. Surgical: Mr. Seth Stewart  has a past surgical history that includes Eye surgery (09/21/2009); Laminectomy (09/21/2002); Colonoscopy (12/06/2014); Ablation; Hernia repair (Right, 01/07/2015); Esophagogastroduodenoscopy (N/A, 10/29/2022); Back surgery; Esophagogastroduodenoscopy; and Esophagogastroduodenoscopy (egd) with propofol (N/A, 12/04/2022). Family: family history includes Cancer in his mother; Heart attack in his father and paternal uncle; Heart disease in his father.  Laboratory Chemistry Profile   Renal Lab Results  Component Value Date   BUN 12 01/25/2023   CREATININE 0.91 01/25/2023   BCR 13 01/25/2023   GFRAA >60 05/09/2019   GFRNONAA >60 05/09/2019    Hepatic Lab Results  Component Value Date   AST 22 01/25/2023   ALT 12 01/25/2023   ALBUMIN 4.3 01/25/2023   ALKPHOS 48 01/25/2023    Electrolytes Lab Results  Component Value Date   NA 135 01/25/2023   K 4.2 01/25/2023   CL 105 01/25/2023   CALCIUM 9.4 01/25/2023   PHOS 3.3 11/11/2015    Bone No results found for: "VD25OH", "VD125OH2TOT", "UJ8119JY7", "WG9562ZH0", "25OHVITD1", "25OHVITD2", "25OHVITD3", "TESTOFREE", "TESTOSTERONE"  Inflammation (CRP: Acute Phase) (ESR: Chronic Phase) No results found for: "CRP", "ESRSEDRATE", "LATICACIDVEN"       Note: Above Lab results reviewed.  Physical Exam  General appearance: Well nourished, well developed, and well hydrated. In no apparent acute distress Mental status: Alert, oriented x 3 (person, place, & time)       Respiratory: No evidence of acute respiratory distress Eyes: PERLA Vitals: BP 114/68 (BP Location: Left Arm, Patient Position: Sitting, Cuff Size: Normal)   Pulse (!) 103   Temp (!) 97.3 F (36.3 C) (Temporal)   Resp 16   Ht 6\' 3"  (1.905 m)   Wt 208 lb (94.3 kg)   SpO2 99%   BMI 26.00 kg/m  BMI: Estimated body mass index is 26 kg/m as calculated from the  following:   Height as of this encounter: 6\' 3"  (1.905 m).   Weight as of this encounter: 208 lb (94.3 kg). Ideal: Ideal body weight: 84.5 kg (186 lb 4.6 oz) Adjusted ideal body weight: 88.4 kg (194 lb 15.6 oz)    Assessment   Diagnosis Status  1. Chronic radicular lumbar pain   2. Radicular pain of thoracic region   3. Failed back surgical syndrome   4. Hx of decompressive lumbar laminectomy (L1-S1)   5. Chronic pain syndrome    Controlled Controlled Controlled     Plan of Care  Patient states that he is overall doing better after his second epidural steroid injection.  He has been playing golf.  He has upcoming trips planned that he is looking forward to.  He states that he is more functional has less pain. Continue to monitor symptoms, repeat lumbar ESI as needed.  Future considerations include spinal cord stimulator trial  Orders:  Orders Placed This Encounter  Procedures   Lumbar Epidural Injection    Standing Status:   Standing    Number of Occurrences:   2    Standing Expiration Date:   08/01/2024    Scheduling Instructions:     Procedure: Interlaminar Lumbar Epidural Steroid injection (LESI)      T12/L1 and L1/2      Laterality: Midline     Sedation: Patient's choice.     Timeframe: ASAA    Order Specific Question:  Where will this procedure be performed?    Answer:   [facility]   Follow-up plan:   Return for patient will call to schedule F2F appt prn.      T11/T12 ESI (hx of L1-L5 decompression): 03/22/2023, 06/28/2023     Recent Visits Date Type Provider Dept  06/28/23 Procedure visit Edward Jolly, MD [facility]  Showing recent visits within past 90 days and meeting all other requirements Today's Visits Date Type Provider Dept  08/02/23 Office Visit Edward Jolly, MD [facility]  Showing today's visits and meeting all other requirements Future Appointments No visits were found meeting these conditions. Showing future  appointments within next 90 days and meeting all other requirements  I discussed the assessment and treatment plan with the patient. The patient was provided an opportunity to ask questions and all were answered. The patient agreed with the plan and demonstrated an understanding of the instructions.  Patient advised to call back or seek an in-person evaluation if the symptoms or condition worsens.  Duration of encounter: .  Total time on encounter, as per AMA guidelines included both the face-to-face and non-face-to-face time personally spent by the physician and/or other qualified health care professional(s) on the day of the encounter (includes time in activities that require the physician or other qualified health care professional and does not include time in activities normally performed by clinical staff). Physician's time may include the following activities when performed: Preparing to see the patient (e.g., pre-charting review of records, searching for previously ordered imaging, lab work, and nerve conduction tests) Review of prior analgesic pharmacotherapies. Reviewing PMP Interpreting ordered tests (e.g., lab work, imaging, nerve conduction tests) Performing post-procedure evaluations, including interpretation of diagnostic procedures Obtaining and/or reviewing separately obtained history Performing a medically appropriate examination and/or evaluation Counseling and educating the patient/family/caregiver Ordering medications, tests, or procedures Referring and communicating with other health care professionals (when not separately reported) Documenting clinical information in the electronic or other health record Independently interpreting results (not separately reported) and communicating results to the patient/ family/caregiver Care coordination (not separately reported)  Note by: Edward Jolly, MD Date: 08/02/2023; Time: 3:06 PM

## 2023-08-02 NOTE — Progress Notes (Signed)
Safety precautions to be maintained throughout the outpatient stay will include: orient to surroundings, keep bed in low position, maintain call bell within reach at all times, provide assistance with transfer out of bed and ambulation.  

## 2023-08-04 DIAGNOSIS — D0461 Carcinoma in situ of skin of right upper limb, including shoulder: Secondary | ICD-10-CM | POA: Diagnosis not present

## 2023-10-08 ENCOUNTER — Telehealth: Payer: Self-pay | Admitting: Cardiovascular Disease

## 2023-10-08 ENCOUNTER — Other Ambulatory Visit: Payer: Self-pay | Admitting: Family Medicine

## 2023-10-08 ENCOUNTER — Telehealth: Payer: Self-pay | Admitting: Cardiology

## 2023-10-08 DIAGNOSIS — E782 Mixed hyperlipidemia: Secondary | ICD-10-CM

## 2023-10-08 MED ORDER — METOPROLOL TARTRATE 25 MG PO TABS: 25.0000 mg | ORAL_TABLET | Freq: Two times a day (BID) | ORAL | 0 refills | Status: DC

## 2023-10-08 NOTE — Telephone Encounter (Signed)
Last office visit: 01/05/23 with plan to f/u 6 months. next office visit: none/active recall  Please schedule f/u appt.  Thanks!

## 2023-10-08 NOTE — Telephone Encounter (Signed)
Requested Prescriptions   Signed Prescriptions Disp Refills   metoprolol tartrate (LOPRESSOR) 25 MG tablet 180 tablet 0    Sig: Take 1 tablet (25 mg total) by mouth 2 (two) times daily. Overdue follow up visit.  PLEASE CALL OFFICE TO SCHEDULE APPOINTMENT PRIOR TO NEXT REFILL    Authorizing Provider: Lorine Bears A    Ordering User: Guerry Minors

## 2023-10-08 NOTE — Telephone Encounter (Signed)
Medication Refill -  Most Recent Primary Care Visit:  Provider: Duanne Limerick  Department: [facility]  Visit Type: OFFICE VISIT  Date: 07/29/2023  Medication: rosuvastatin (CRESTOR) 5 MG tablet   Has the patient contacted their pharmacy? yes (Agent: If yes, when and what did the pharmacy advise?)contact pcp, pt says contact pharmacy previous, but no refill requested showed n the system  Is this the correct pharmacy for this prescription? yes   This is the patient's preferred pharmacy:  [id number] - [place], [place] - [address] AT [facility] [address] [place] [address] Phone: [phone] Fax: [phone]   Has the prescription been filled recently? yes  Is the patient out of the medication? No but only has 2 left Has the patient been seen for an appointment in the last year  OR does the patient have an upcoming appointment? yes  Can we respond through MyChart? yes  Agent: Please be advised that Rx refills may take up to 3 business days. We ask that you follow-up with your pharmacy.

## 2023-10-08 NOTE — Telephone Encounter (Signed)
Request too soon for refill, last refill 07/29/23 for 90 and 1 refill.  Requested Prescriptions  Pending Prescriptions Disp Refills   rosuvastatin (CRESTOR) 5 MG tablet 90 tablet 1    Sig: Take 1 tablet (5 mg total) by mouth daily.     Cardiovascular:  Antilipid - Statins 2 Failed - 10/08/2023  2:11 PM      Failed - Lipid Panel in normal range within the last 12 months    Cholesterol, Total  Date Value Ref Range Status  07/29/2023 194 100 - 199 mg/dL Final   LDL Chol Calc (NIH)  Date Value Ref Range Status  07/29/2023 90 0 - 99 mg/dL Final   LDL Direct  Date Value Ref Range Status  01/25/2023 121 (H) 0 - 99 mg/dL Final   HDL  Date Value Ref Range Status  07/29/2023 92 >39 mg/dL Final   Triglycerides  Date Value Ref Range Status  07/29/2023 62 0 - 149 mg/dL Final         Passed - Cr in normal range and within 360 days    Creatinine  Date Value Ref Range Status  01/11/2013 0.85 0.60 - 1.30 mg/dL Final   Creatinine, Ser  Date Value Ref Range Status  01/25/2023 0.91 0.76 - 1.27 mg/dL Final         Passed - Patient is not pregnant      Passed - Valid encounter within last 12 months    Recent Outpatient Visits           2 months ago Mixed hyperlipidemia   [place] [facility]   8 months ago Mixed hyperlipidemia   [place] [facility]   1 year ago Acute maxillary sinusitis, recurrence not specified   [place] [facility]   1 year ago Mixed hyperlipidemia   [place] [facility]   1 year ago Trigger finger, right middle finger   [facility], Ocie Bob, MD       Future Appointments             In 3 months Duanne Limerick, MD [facility] at [facility], [facility]

## 2023-10-08 NOTE — Telephone Encounter (Signed)
*  STAT* If patient is at the pharmacy, call can be transferred to refill team.   1. Which medications need to be refilled? (please list name of each medication and dose if known) metoprolol tartrate (LOPRESSOR) 25 MG tablet    2. Would you like to learn more about the convenience, safety, & potential cost savings by using the [facility]?    3. Are you open to using the [facility] (Type [facility]. ).   4. Which pharmacy/location (including street and city if local pharmacy) is medication to be sent to? [id number] - [place], [place] - [address]    5. Do they need a 30 day or 90 day supply? 90

## 2023-10-11 NOTE — Telephone Encounter (Signed)
LVM to schedule f/u appt

## 2023-10-12 ENCOUNTER — Other Ambulatory Visit: Payer: Self-pay

## 2023-10-12 DIAGNOSIS — E782 Mixed hyperlipidemia: Secondary | ICD-10-CM

## 2023-10-12 MED ORDER — ROSUVASTATIN CALCIUM 5 MG PO TABS: 5.0000 mg | ORAL_TABLET | Freq: Every day | ORAL | 0 refills | Status: DC

## 2023-10-24 ENCOUNTER — Encounter: Payer: Self-pay | Admitting: Family Medicine

## 2023-11-01 DIAGNOSIS — Z23 Encounter for immunization: Secondary | ICD-10-CM | POA: Diagnosis not present

## 2023-11-09 ENCOUNTER — Encounter: Payer: Self-pay | Admitting: Emergency Medicine

## 2023-11-11 ENCOUNTER — Ambulatory Visit: Payer: Medicare Other | Admitting: Emergency Medicine

## 2023-11-11 VITALS — Ht 75.0 in | Wt 208.0 lb

## 2023-11-11 DIAGNOSIS — Z0000 Encounter for general adult medical examination without abnormal findings: Secondary | ICD-10-CM

## 2023-11-11 NOTE — Patient Instructions (Signed)
Seth Stewart , Thank you for taking time to come for your Medicare Wellness Visit. I appreciate your ongoing commitment to your health goals. Please review the following plan we discussed and let me know if I can assist you in the future.   Referrals/Orders/Follow-Ups/Clinician Recommendations: Keep up the good work! Start exercising. Recommend 150 minutes of exercise per week.   This is a list of the screening recommended for you and due dates:  Health Maintenance  Topic Date Due   COVID-19 Vaccine (6 - 2024-25 season) 05/23/2023   Medicare Annual Wellness Visit  11/10/2024   DTaP/Tdap/Td vaccine (3 - Td or Tdap) 04/28/2033   Pneumonia Vaccine  Completed   Flu Shot  Completed   Zoster (Shingles) Vaccine  Completed   HPV Vaccine  Aged Out    Advanced directives: (Copy Requested) Please bring a copy of your health care power of attorney and living will to the office to be added to your chart at your convenience.  Next Medicare Annual Wellness Visit scheduled for next year: Yes, 11/23/24 @ 10:00 am (phone visit)

## 2023-11-11 NOTE — Progress Notes (Signed)
Subjective:    Subjective:   Seth Sigg Sr. is a 83 y.o. who presents for a Medicare Wellness preventive visit.  Visit Complete: Virtual I connected with  Seth Clinton Sr. on 11/11/23 by a audio enabled telemedicine application and verified that I am speaking with the correct person using two identifiers.  Patient Location: Home  Provider Location: Home Office  I discussed the limitations of evaluation and management by telemedicine. The patient expressed understanding and agreed to proceed.  Vital Signs: Because this visit was a virtual/telehealth visit, some criteria may be missing or patient reported. Any vitals not documented were not able to be obtained and vitals that have been documented are patient reported.  VideoDeclined- This patient declined Librarian, academic. Therefore the visit was completed with audio only.  AWV Questionnaire: No: Patient Medicare AWV questionnaire was not completed prior to this visit.  Cardiac Risk Factors include: advanced age (>18men, >27 women);male gender;dyslipidemia     Objective:    Today's Vitals   11/11/23 0954  Weight: 208 lb (94.3 kg)  Height: 6\' 3"  (1.905 m)  PainSc: 2    Body mass index is 26 kg/m.     11/11/2023   10:06 AM 06/28/2023    9:38 AM 04/26/2023    2:20 PM 03/22/2023    1:10 PM 03/04/2023    7:52 AM 12/04/2022    8:53 AM 11/04/2022   11:35 AM  Advanced Directives  Does Patient Have a Medical Advance Directive? Yes Yes Yes Yes Yes Yes No  Type of Estate agent of [place];Living will     Living will;Healthcare Power of Attorney   Does patient want to make changes to medical advance directive? No - Patient declined    No - Patient declined    Copy of Healthcare Power of Attorney in Chart? No - copy requested     No - copy requested   Would patient like information on creating a medical advance directive?       No - Patient declined    Current  Medications (verified) Outpatient Encounter Medications as of 11/11/2023  Medication Sig   amiodarone (PACERONE) 100 MG tablet Take 100 mg by mouth daily.   Ascorbic Acid (VITAMIN C) 1000 MG tablet Take 1,000 mg by mouth daily.   aspirin 81 MG EC tablet Take 81 mg by mouth daily.   Flaxseed, Linseed, (FLAX SEEDS PO) Take 2 Scoops by mouth daily.   Lactobacillus (PROBIOTIC ACIDOPHILUS PO) Take 1 capsule by mouth.   metoprolol tartrate (LOPRESSOR) 25 MG tablet Take 1 tablet (25 mg total) by mouth 2 (two) times daily. Overdue follow up visit.  PLEASE CALL OFFICE TO SCHEDULE APPOINTMENT PRIOR TO NEXT REFILL   Multiple Vitamins-Minerals (MULTIVITAMIN WITH MINERALS) tablet Take 1 tablet by mouth daily.   Omega-3 1000 MG CAPS Take 1 capsule by mouth 2 (two) times daily. otc   pantoprazole (PROTONIX) 40 MG tablet Take 40 mg by mouth daily.   rosuvastatin (CRESTOR) 5 MG tablet Take 1 tablet (5 mg total) by mouth daily.   tadalafil (CIALIS) 20 MG tablet Take 20 mg by mouth daily as needed.   vitamin E 400 UNIT capsule Take 400 Units by mouth daily.   No facility-administered encounter medications on file as of 11/11/2023.    Allergies (verified) Patient has no known allergies.   History: Past Medical History:  Diagnosis Date   AF (atrial fibrillation) (HCC)    Back pain  Dysphagia    Dysrhythmia    Hemorrhoids    Hyperlipidemia    Hypertension    Past Surgical History:  Procedure Laterality Date   ABLATION     BACK SURGERY     COLONOSCOPY  12/06/2014   [name]   ESOPHAGOGASTRODUODENOSCOPY N/A 10/29/2022   Procedure: ESOPHAGOGASTRODUODENOSCOPY (EGD);  Surgeon: Jaynie Collins, DO;  Location: [facility] ENDOSCOPY;  Service: Gastroenterology;  Laterality: N/A;   ESOPHAGOGASTRODUODENOSCOPY     ESOPHAGOGASTRODUODENOSCOPY (EGD) WITH PROPOFOL N/A 12/04/2022   Procedure: ESOPHAGOGASTRODUODENOSCOPY (EGD) WITH PROPOFOL;  Surgeon: Jaynie Collins, DO;  Location: [facility] ENDOSCOPY;  Service:  Gastroenterology;  Laterality: N/A;   EYE SURGERY  09/21/2009   HERNIA REPAIR Right 01/07/2015   right inguinal repair, medium ultra Pro mesh   LAMINECTOMY  09/21/2002   Family History  Problem Relation Age of Onset   Cancer Mother    Heart attack Father    Heart disease Father    Heart attack Paternal Uncle    Social History   Socioeconomic History   Marital status: Married    Spouse name: Hisham Provence   Number of children: 1   Years of education: 16   Highest education level: Bachelor's degree (e.g., BA, AB, BS)  Occupational History   Occupation: retired  Tobacco Use   Smoking status: Never   Smokeless tobacco: Never  Vaping Use   Vaping status: Never Used  Substance and Sexual Activity   Alcohol use: Yes    Alcohol/week: 7.0 - 14.0 standard drinks of alcohol    Types: 7 - 14 Cans of beer per week    Comment: 1-2 beers or wine daily   Drug use: Never   Sexual activity: Yes    Partners: Female  Other Topics Concern   Not on file  Social History Narrative   Not on file   Social Drivers of Health   Financial Resource Strain: Low Risk  (11/11/2023)   Overall Financial Resource Strain (CARDIA)    Difficulty of Paying Living Expenses: Not hard at all  Food Insecurity: No Food Insecurity (11/11/2023)   Hunger Vital Sign    Worried About Running Out of Food in the Last Year: Never true    Ran Out of Food in the Last Year: Never true  Transportation Needs: No Transportation Needs (11/11/2023)   PRAPARE - Administrator, Civil Service (Medical): No    Lack of Transportation (Non-Medical): No  Physical Activity: Inactive (11/11/2023)   Exercise Vital Sign    Days of Exercise per Week: 0 days    Minutes of Exercise per Session: 0 min  Stress: Stress Concern Present (11/11/2023)   [employer] of Occupational Health - Occupational Stress Questionnaire    Feeling of Stress : To some extent  Social Connections: Moderately Integrated (11/11/2023)   Social  Connection and Isolation Panel [NHANES]    Frequency of Communication with Friends and Family: More than three times a week    Frequency of Social Gatherings with Friends and Family: Twice a week    Attends Religious Services: Never    Database administrator or Organizations: Yes    Attends Engineer, structural: More than 4 times per year    Marital Status: Married    Tobacco Counseling Counseling given: Not Answered    Clinical Intake:  Pre-visit preparation completed: Yes  Pain : 0-10 Pain Score: 2  Pain Type: Chronic pain Pain Location: Back Pain Descriptors / Indicators: Aching  BMI - recorded: 26 Nutritional Status: BMI 25 -29 Overweight Nutritional Risks: None Diabetes: No  How often do you need to have someone help you when you read instructions, pamphlets, or other written materials from your doctor or pharmacy?: 1 - Never  Interpreter Needed?: No  Information entered by :: Tiegan Jambor, CMA   Activities of Daily Living     11/11/2023    9:56 AM  In your present state of health, do you have any difficulty performing the following activities:  Hearing? 1  Comment wears hearing aids  Vision? 0  Difficulty concentrating or making decisions? 0  Walking or climbing stairs? 0  Dressing or bathing? 0  Doing errands, shopping? 0  Preparing Food and eating ? N  Using the Toilet? N  In the past six months, have you accidently leaked urine? N  Do you have problems with loss of bowel control? N  Managing your Medications? N  Managing your Finances? N  Housekeeping or managing your Housekeeping? N    Patient Care Team: Duanne Limerick, MD as PCP - General (Family Medicine) Iran Ouch, MD as PCP - Cardiology (Cardiology) Lady Gary Darlin Priestly, MD as Consulting Physician (Cardiology) [facility], [facility])  Indicate any recent Medical Services you may have received from other than [facility] providers in the past year (date may be  approximate).     Assessment:   This is a routine wellness examination for Seth Stewart.  Hearing/Vision screen Hearing Screening - Comments:: Wears hearing aids Vision Screening - Comments:: Gets eye exams, [facility]   Goals Addressed             This Visit's Progress    Exercise 150 min/wk Moderate Activity         Depression Screen     11/11/2023   10:04 AM 07/29/2023   10:44 AM 06/28/2023    9:38 AM 01/25/2023   11:04 AM 11/04/2022   11:34 AM 10/05/2022    4:01 PM 07/27/2022   10:31 AM  PHQ 2/9 Scores  PHQ - 2 Score 0 0 0 0 0 0 0  PHQ- 9 Score  2  0 0 0 0    Fall Risk     11/11/2023   10:07 AM 08/02/2023    1:35 PM 07/29/2023   10:44 AM 06/28/2023    9:37 AM 04/26/2023    2:20 PM  Fall Risk   Falls in the past year? 0 0 0 0 0  Number falls in past yr: 0 0 0    Injury with Fall? 0  0    Risk for fall due to : No Fall Risks No Fall Risks History of fall(s);No Fall Risks    Follow up Falls prevention discussed;Falls evaluation completed Falls evaluation completed Falls evaluation completed      MEDICARE RISK AT HOME:  Medicare Risk at Home Any stairs in or around the home?: No If so, are there any without handrails?: No Home free of loose throw rugs in walkways, pet beds, electrical cords, etc?: Yes Adequate lighting in your home to reduce risk of falls?: Yes Life alert?: No Use of a cane, walker or w/c?: No Grab bars in the bathroom?: Yes Shower chair or bench in shower?: Yes Elevated toilet seat or a handicapped toilet?: No  TIMED UP AND GO:  Was the test performed?  No  Cognitive Function: 6CIT completed        11/11/2023   10:08 AM 11/04/2022  11:36 AM  6CIT Screen  What Year? 0 points 0 points  What month? 0 points 0 points  What time? 0 points 0 points  Count back from 20 0 points 0 points  Months in reverse 0 points 0 points  Repeat phrase 0 points 0 points  Total Score 0 points 0 points    Immunizations Immunization History   Administered Date(s) Administered   Fluad Quad(high Dose 65+) 05/16/2019, 06/10/2021   Influenza, High Dose Seasonal PF 06/21/2017   Influenza-Unspecified 06/16/2016, 06/21/2017, 05/25/2020   PFIZER(Purple Top)SARS-COV-2 Vaccination 09/25/2019, 10/16/2019, 05/25/2020, 01/02/2021   Pfizer Covid-19 Vaccine Bivalent Booster 67yrs & up 08/07/2021   Pneumococcal Conjugate-13 09/21/2013   Pneumococcal Polysaccharide-23 06/27/2015   Tdap 11/11/2015   Zoster Recombinant(Shingrix) 06/13/2018, 08/12/2018   Zoster, Live 09/22/2007    Screening Tests Health Maintenance  Topic Date Due   COVID-19 Vaccine (6 - 2024-25 season) 05/23/2023   Medicare Annual Wellness (AWV)  11/10/2024   DTaP/Tdap/Td (2 - Td or Tdap) 11/10/2025   Pneumonia Vaccine 65+ Years old  Completed   INFLUENZA VACCINE  Completed   Zoster Vaccines- Shingrix  Completed   HPV VACCINES  Aged Out    Health Maintenance  Health Maintenance Due  Topic Date Due   COVID-19 Vaccine (6 - 2024-25 season) 05/23/2023   Health Maintenance Items Addressed: Colon no longer needed due to age Received flu vaccine 04/2023 per patient Received covid vaccine 10/29/23 per patient  Additional Screening:  Vision Screening: Recommended annual ophthalmology exams for early detection of glaucoma and other disorders of the eye.  Dental Screening: Recommended annual dental exams for proper oral hygiene  Community Resource Referral / Chronic Care Management: CRR required this visit?  No   CCM required this visit?  No     Plan:     I have personally reviewed and noted the following in the patient's chart:   Medical and social history Use of alcohol, tobacco or illicit drugs  Current medications and supplements including opioid prescriptions. Patient is not currently taking opioid prescriptions. Functional ability and status Nutritional status Physical activity Advanced directives List of other physicians Hospitalizations, surgeries,  and ER visits in previous 12 months Vitals Screenings to include cognitive, depression, and falls Referrals and appointments  In addition, I have reviewed and discussed with patient certain preventive protocols, quality metrics, and best practice recommendations. A written personalized care plan for preventive services as well as general preventive health recommendations were provided to patient.     Tora Kindred, CMA   11/11/2023   After Visit Summary: (MyChart) Due to this being a telephonic visit, the after visit summary with patients personalized plan was offered to patient via MyChart   Notes: Nothing significant to report at this time.

## 2023-11-27 ENCOUNTER — Encounter: Payer: Self-pay | Admitting: Family Medicine

## 2023-11-28 ENCOUNTER — Other Ambulatory Visit: Payer: Self-pay | Admitting: Family Medicine

## 2023-11-28 MED ORDER — AMIODARONE HCL 100 MG PO TABS: 100.0000 mg | ORAL_TABLET | Freq: Every day | ORAL | 0 refills | Status: DC

## 2023-12-29 DIAGNOSIS — D2261 Melanocytic nevi of right upper limb, including shoulder: Secondary | ICD-10-CM | POA: Diagnosis not present

## 2023-12-29 DIAGNOSIS — D485 Neoplasm of uncertain behavior of skin: Secondary | ICD-10-CM | POA: Diagnosis not present

## 2023-12-29 DIAGNOSIS — D0472 Carcinoma in situ of skin of left lower limb, including hip: Secondary | ICD-10-CM | POA: Diagnosis not present

## 2023-12-29 DIAGNOSIS — L570 Actinic keratosis: Secondary | ICD-10-CM | POA: Diagnosis not present

## 2023-12-29 DIAGNOSIS — D235 Other benign neoplasm of skin of trunk: Secondary | ICD-10-CM | POA: Diagnosis not present

## 2023-12-29 DIAGNOSIS — D225 Melanocytic nevi of trunk: Secondary | ICD-10-CM | POA: Diagnosis not present

## 2023-12-29 DIAGNOSIS — D2262 Melanocytic nevi of left upper limb, including shoulder: Secondary | ICD-10-CM | POA: Diagnosis not present

## 2023-12-29 DIAGNOSIS — D2272 Melanocytic nevi of left lower limb, including hip: Secondary | ICD-10-CM | POA: Diagnosis not present

## 2023-12-29 DIAGNOSIS — Z85828 Personal history of other malignant neoplasm of skin: Secondary | ICD-10-CM | POA: Diagnosis not present

## 2023-12-29 DIAGNOSIS — L820 Inflamed seborrheic keratosis: Secondary | ICD-10-CM | POA: Diagnosis not present

## 2023-12-29 DIAGNOSIS — L538 Other specified erythematous conditions: Secondary | ICD-10-CM | POA: Diagnosis not present

## 2023-12-29 DIAGNOSIS — D0439 Carcinoma in situ of skin of other parts of face: Secondary | ICD-10-CM | POA: Diagnosis not present

## 2024-01-01 ENCOUNTER — Other Ambulatory Visit: Payer: Self-pay | Admitting: Cardiovascular Disease

## 2024-01-03 ENCOUNTER — Encounter: Payer: Self-pay | Admitting: Student

## 2024-01-03 ENCOUNTER — Ambulatory Visit: Attending: Student | Admitting: Student

## 2024-01-03 VITALS — BP 140/80 | HR 74 | Ht 75.0 in | Wt 209.8 lb

## 2024-01-03 DIAGNOSIS — G4730 Sleep apnea, unspecified: Secondary | ICD-10-CM | POA: Diagnosis not present

## 2024-01-03 DIAGNOSIS — I2510 Atherosclerotic heart disease of native coronary artery without angina pectoris: Secondary | ICD-10-CM | POA: Insufficient documentation

## 2024-01-03 DIAGNOSIS — I480 Paroxysmal atrial fibrillation: Secondary | ICD-10-CM | POA: Insufficient documentation

## 2024-01-03 MED ORDER — APIXABAN 5 MG PO TABS: 5.0000 mg | ORAL_TABLET | Freq: Two times a day (BID) | ORAL | 3 refills | Status: DC

## 2024-01-03 MED ORDER — RIVAROXABAN 2.5 MG PO TABS: 2.5000 mg | ORAL_TABLET | Freq: Two times a day (BID) | ORAL | 6 refills | Status: DC

## 2024-01-03 NOTE — Progress Notes (Signed)
 Cardiology Clinic Note   Date: 01/03/2024 ID: Seth Goes Sr., DOB April 30, 1941, MRN [id number]  Primary Cardiologist:  Antionette Kirks, MD  Chief Complaint   Seth Goes Sr. is a 83 y.o. male who presents to the clinic today for overdue follow up.   Patient Profile   Seth Gurry Sr. is followed by Dr. Alvenia Aus for the history outlined below.      Past medical history significant for: Nonobstructive CAD.  Coronary CTA with FFR 01/01/2022: Coronary calcium score 336.  Moderate proximal RCA stenosis.  Minimal LAD and LCx stenosis.  FFR did not show any significant stenosis. PAF/aflutter.  Onset 2013. A-flutter ablation performed at [facility]. Cardioversion. Hyperlipidemia. Lipid panel 07/29/2023: LDL 90, HDL 92, TG 62, total 194.  In summary, patient was previously followed by Dr. Cara Chancellor.  He established care with Dr. Alvenia Aus on 01/05/2023.  He reported onset of A- flutter in 2013.  He underwent a flutter ablation but went into A-fib and required cardioversions.  He was placed on low-dose amiodarone.  He was previously anticoagulated around his dysplasia but had not been on anticoagulation for years.  He underwent coronary CTA after abnormal nuclear stress test in April 2023 as detailed above.  With a CHA2DS2-VASc of 3 it was recommended he start anticoagulation.  He preferred to stay on aspirin.     History of Present Illness    Today, patient reports he just got back from [place] one week ago. He reports feeling very fatigued while he was there which is unusual for him. He has been feeling more himself since he returned and was able to complete his 1 mile walk without difficulty. Patient denies shortness of breath, dyspnea on exertion, lower extremity edema, orthopnea or PND. No chest pain, pressure, or tightness. No palpitations. He states in August he communicated with our office through MyChart secondary to his heart rate increasing to 120s captured on  his Garmin watch. It stopped on its own so he did not reach out to our office any further. He reports episodes of feeling flushed and his face turning red.      ROS: All other systems reviewed and are otherwise negative except as noted in History of Present Illness.  EKGs/Labs Reviewed    EKG Interpretation Date/Time:  Monday January 03 2024 15:38:51 EDT Ventricular Rate:  75 PR Interval:    QRS Duration:  90 QT Interval:  432 QTC Calculation: 482 R Axis:   16  Text Interpretation: Atrial fibrillation Minimal voltage criteria for LVH, may be normal variant ( Sokolow-Lyon ) Nonspecific ST and T wave abnormality When compared with ECG of 01/05/2023 (not in [facility]) Afib has replaced sinus rhytm with 1st degree AV block and PACs Reconfirmed by Morey Ar [phone]) on 01/03/2024 3:42:24 PM   01/25/2023: ALT 12; AST 22; BUN 12; Creatinine, Ser 0.91; Potassium 4.2; Sodium 135    Risk Assessment/Calculations     CHA2DS2-VASc Score = 2   This indicates a 2.2% annual risk of stroke. The patient's score is based upon: CHF History: 0 HTN History: 0 Diabetes History: 0 Stroke History: 0 Vascular Disease History: 0 Age Score: 2 Gender Score: 0     STOP-Bang Score:  4       Physical Exam    VS:  BP (!) 140/80   Pulse 74   Ht 6\' 3"  (1.905 m)   Wt 209 lb 12.8 oz (95.2 kg)   SpO2 92%   BMI 26.22 kg/m  ,  BMI Body mass index is 26.22 kg/m.  GEN: Well nourished, well developed, in no acute distress. Neck: No JVD or carotid bruits. Cardiac:  RRR. No murmurs. No rubs or gallops.   Respiratory:  Respirations regular and unlabored. Clear to auscultation without rales, wheezing or rhonchi. GI: Soft, nontender, nondistended. Extremities: Radials/DP/PT 2+ and equal bilaterally. No clubbing or cyanosis. No edema.  Skin: Warm and dry, no rash. Neuro: Strength intact.  Assessment & Plan   Nonobstructive CAD Coronary CTA with FFR April 2023 showed coronary calcium score of 336, moderate  proximal RCA stenosis, minimal LAD and LCx stenosis, FFR did not show any significant stenosis.  Patient denies chest pain, pressure or tightness.  - Continue rosuvastatin, metoprolol, aspirin.  PAF/a-flutter Onset 2013.  A flutter ablation performed at [facility].  Cardioversion performed after developing A-fib following a-flutter ablation.  Patient has been maintained on amiodarone.  He reports feeling fatigued during recent trip to [place]. Since he returned 1 week ago he has felt more himself and was able to compete 1 mile walk without difficulty. He denies palpitations. EKG today demonstrates afib 75 bpm. Patient does not have cardiac awareness of arhythmia. He does report episodes of flushing and wonders if this is indicative of him being in afib. He is flushed at the time of his visit.  Discussed options of rhythm vs rate control with patient. He is in agreement with starting anticoagulation for potential cardioversion versus ablation. He would like discuss this with EP before proceeding.  - Continue amiodarone, metoprolol. -Start Eliquis 5 mg bid. Appropriate Eliquis dose.  -Refer to EP.  -Schedule echo.  -CMP, CBC, TSH.   Sleep disorder breathing Patient reports daytime somnolence, snoring and being told he stops breathing at night. Stop Bang 4.  -Refer to pulmonology.   Disposition: CBC, CMP, TSH today. Schedule echo. Start Eliquis 5 mg bid. Refer to EP.          Signed, Lonell Rives. Amilya Haver, DNP, NP-C

## 2024-01-03 NOTE — Patient Instructions (Addendum)
 Medication Instructions:  Your physician recommends the following medication changes.   START TAKING: XARELTO 5 MG TWICE DAILY  *If you need a refill on your cardiac medications before your next appointment, please call your pharmacy*  Lab Work: Your provider would like for you to have following labs drawn today CBC,CMET,TSH.   If you have labs (blood work) drawn today and your tests are completely normal, you will receive your results only by: MyChart Message (if you have MyChart) OR A paper copy in the mail If you have any lab test that is abnormal or we need to change your treatment, we will call you to review the results.  Testing/Procedures: Your physician has requested that you have an echocardiogram. Echocardiography is a painless test that uses sound waves to create images of your heart. It provides your doctor with information about the size and shape of your heart and how well your heart's chambers and valves are working.   You may receive an ultrasound enhancing agent through an IV if needed to better visualize your heart during the echo. This procedure takes approximately one hour.  There are no restrictions for this procedure.  This will take place at [address] [facility] [address]) [address], [place] [address]  Please note: We ask at that you not bring children with you during ultrasound (echo/ vascular) testing. Due to room size and safety concerns, children are not allowed in the ultrasound rooms during exams. Our front office staff cannot provide observation of children in our lobby area while testing is being conducted. An adult accompanying a patient to their appointment will only be allowed in the ultrasound room at the discretion of the ultrasound technician under special circumstances. We apologize for any inconvenience.   Follow-Up: At [facility], you and your health needs are our priority.  As part of our continuing mission to provide you with  exceptional heart care, our providers are all part of one team.  This team includes your primary Cardiologist (physician) and Advanced Practice Providers or APPs (Physician Assistants and Nurse Practitioners) who all work together to provide you with the care you need, when you need it.  Your next appointment:  Next avalibale  with EP doctor  Provider:   Harvie Liner, MD, Adaline Holly, NP, or Azucena Leos     Other Instructions Referral to Pulmololgy  Referral to Cardiac Electrophysiology

## 2024-01-04 LAB — CBC
Hematocrit: 42.1 % (ref 37.5–51.0)
Hemoglobin: 13.9 g/dL (ref 13.0–17.7)
MCH: 31.7 pg (ref 26.6–33.0)
MCHC: 33 g/dL (ref 31.5–35.7)
MCV: 96 fL (ref 79–97)
Platelets: 168 10*3/uL (ref 150–450)
RBC: 4.38 x10E6/uL (ref 4.14–5.80)
RDW: 12.5 % (ref 11.6–15.4)
WBC: 5.7 10*3/uL (ref 3.4–10.8)

## 2024-01-04 LAB — COMPREHENSIVE METABOLIC PANEL WITH GFR
ALT: 16 IU/L (ref 0–44)
AST: 21 IU/L (ref 0–40)
Albumin: 4.6 g/dL (ref 3.7–4.7)
Alkaline Phosphatase: 47 IU/L (ref 44–121)
BUN/Creatinine Ratio: 11 (ref 10–24)
BUN: 13 mg/dL (ref 8–27)
Bilirubin Total: 0.8 mg/dL (ref 0.0–1.2)
CO2: 22 mmol/L (ref 20–29)
Calcium: 9.8 mg/dL (ref 8.6–10.2)
Chloride: 103 mmol/L (ref 96–106)
Creatinine, Ser: 1.16 mg/dL (ref 0.76–1.27)
Globulin, Total: 2.3 g/dL (ref 1.5–4.5)
Glucose: 92 mg/dL (ref 70–99)
Potassium: 4.6 mmol/L (ref 3.5–5.2)
Sodium: 138 mmol/L (ref 134–144)
Total Protein: 6.9 g/dL (ref 6.0–8.5)
eGFR: 62 mL/min/{1.73_m2} (ref >59)

## 2024-01-04 LAB — TSH: TSH: 4.32 u[IU]/mL (ref 0.450–4.500)

## 2024-01-04 NOTE — Telephone Encounter (Signed)
-----   Message from Morey Ar sent at 01/03/2024  5:54 PM EDT ----- Dovie Gell,   Will  you please contact this patient and ask him to stop aspirin since he started on Eliquis.   Thank  you!  DW

## 2024-01-11 ENCOUNTER — Other Ambulatory Visit: Payer: Self-pay | Admitting: Family Medicine

## 2024-01-11 DIAGNOSIS — E782 Mixed hyperlipidemia: Secondary | ICD-10-CM

## 2024-01-13 ENCOUNTER — Ambulatory Visit (INDEPENDENT_AMBULATORY_CARE_PROVIDER_SITE_OTHER): Payer: Self-pay | Admitting: Family Medicine

## 2024-01-13 ENCOUNTER — Encounter: Payer: Self-pay | Admitting: Family Medicine

## 2024-01-13 VITALS — BP 122/68 | HR 78 | Resp 16 | Ht 75.0 in | Wt 202.8 lb

## 2024-01-13 DIAGNOSIS — E782 Mixed hyperlipidemia: Secondary | ICD-10-CM | POA: Diagnosis not present

## 2024-01-13 MED ORDER — ROSUVASTATIN CALCIUM 5 MG PO TABS: 5.0000 mg | ORAL_TABLET | Freq: Every day | ORAL | 1 refills | Status: DC

## 2024-01-13 MED ORDER — AMIODARONE HCL 100 MG PO TABS: 100.0000 mg | ORAL_TABLET | Freq: Every day | ORAL | 1 refills | Status: DC

## 2024-01-13 NOTE — Progress Notes (Signed)
 Date:  01/13/2024   Name:  Seth Yurkovich Sr.   DOB:  [birth year]   MRN:  [id number]   Chief Complaint: Hyperlipidemia  Hyperlipidemia This is a chronic problem. The current episode started more than 1 year ago. The problem is controlled. Recent lipid tests were reviewed and are normal. He has no history of chronic renal disease, diabetes, hypothyroidism, liver disease, obesity or nephrotic syndrome. There are no known factors aggravating his hyperlipidemia. Pertinent negatives include no chest pain, focal sensory loss, focal weakness, leg pain, myalgias or shortness of breath. Current antihyperlipidemic treatment includes statins. The current treatment provides moderate improvement of lipids. There are no compliance problems.     Lab Results  Component Value Date   NA 138 01/03/2024   K 4.6 01/03/2024   CO2 22 01/03/2024   GLUCOSE 92 01/03/2024   BUN 13 01/03/2024   CREATININE 1.16 01/03/2024   CALCIUM  9.8 01/03/2024   EGFR 62 01/03/2024   GFRNONAA >60 05/09/2019   Lab Results  Component Value Date   CHOL 194 07/29/2023   HDL 92 07/29/2023   LDLCALC 90 07/29/2023   LDLDIRECT 121 (H) 01/25/2023   TRIG 62 07/29/2023   CHOLHDL 2.4 11/23/2016   Lab Results  Component Value Date   TSH 4.320 01/03/2024   No results found for: "HGBA1C" Lab Results  Component Value Date   WBC 5.7 01/03/2024   HGB 13.9 01/03/2024   HCT 42.1 01/03/2024   MCV 96 01/03/2024   PLT 168 01/03/2024   Lab Results  Component Value Date   ALT 16 01/03/2024   AST 21 01/03/2024   ALKPHOS 47 01/03/2024   BILITOT 0.8 01/03/2024   No results found for: "25OHVITD2", "25OHVITD3", "VD25OH"   Review of Systems  HENT:  Negative for congestion.   Eyes:  Negative for visual disturbance.  Respiratory:  Negative for choking, chest tightness, shortness of breath, wheezing and stridor.   Cardiovascular:  Negative for chest pain and palpitations.  Gastrointestinal:  Negative for abdominal distention  and blood in stool.  Endocrine: Negative for polydipsia and polyuria.  Genitourinary:  Negative for difficulty urinating and hematuria.  Musculoskeletal:  Negative for myalgias.  Neurological:  Negative for focal weakness.    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   Trigger finger, right middle finger 05/13/2021   Trigger middle finger of left hand 05/13/2021   Leg cramps 09/07/2020   Frequent falls 04/02/2020   Low back pain 04/02/2020   Neuropathy 03/27/2020   Ataxia 01/25/2020   Imbalance 01/25/2020   Benign prostatic hyperplasia without lower urinary tract symptoms 12/13/2019   Prostate cancer screening 12/13/2019   Chest pain, atypical 06/16/2019   Right inguinal hernia 12/31/2014   Atrial fibrillation (HCC) 05/03/2014   HLD (hyperlipidemia) 05/03/2014   Lumbar canal stenosis 04/12/2012    No Known Allergies  Past Surgical History:  Procedure Laterality Date   ABLATION     BACK SURGERY     COLONOSCOPY  12/06/2014   Dr. Janine Melbourne   ESOPHAGOGASTRODUODENOSCOPY N/A 10/29/2022   Procedure: ESOPHAGOGASTRODUODENOSCOPY (EGD);  Surgeon: Quintin Buckle, DO;  Location: [facility] ENDOSCOPY;  Service: Gastroenterology;  Laterality: N/A;   ESOPHAGOGASTRODUODENOSCOPY     ESOPHAGOGASTRODUODENOSCOPY (EGD) WITH PROPOFOL  N/A 12/04/2022   Procedure: ESOPHAGOGASTRODUODENOSCOPY (EGD) WITH PROPOFOL ;  Surgeon: Polo Brisk  Bambi Lever, DO;  Location: [facility] ENDOSCOPY;  Service: Gastroenterology;  Laterality: N/A;   EYE SURGERY  09/21/2009   HERNIA REPAIR Right 01/07/2015   right inguinal repair, medium ultra Pro mesh   LAMINECTOMY  09/21/2002    Social History   Tobacco Use   Smoking status: Never   Smokeless tobacco: Never  Vaping Use   Vaping status: Never Used  Substance Use Topics    Alcohol use: Yes    Alcohol/week: 7.0 - 14.0 standard drinks of alcohol    Types: 7 - 14 Cans of beer per week    Comment: 1-2 beers or wine daily   Drug use: Never     Medication list has been reviewed and updated.  Current Meds  Medication Sig   amiodarone  (PACERONE ) 100 MG tablet Take 1 tablet (100 mg total) by mouth daily.   apixaban  (ELIQUIS ) 5 MG TABS tablet Take 1 tablet (5 mg total) by mouth 2 (two) times daily.   Ascorbic Acid (VITAMIN C) 1000 MG tablet Take 1,000 mg by mouth daily.   Flaxseed, Linseed, (FLAX SEEDS PO) Take 2 Scoops by mouth daily.   Lactobacillus (PROBIOTIC ACIDOPHILUS PO) Take 1 capsule by mouth.   metoprolol  tartrate (LOPRESSOR ) 25 MG tablet TAKE 1 TABLET BY MOUTH TWICE DAILY   Multiple Vitamins-Minerals (MULTIVITAMIN WITH MINERALS) tablet Take 1 tablet by mouth daily.   Omega-3 1000 MG CAPS Take 1 capsule by mouth 2 (two) times daily. otc   rosuvastatin  (CRESTOR ) 5 MG tablet TAKE 1 TABLET BY MOUTH EVERY DAY   tadalafil  (CIALIS ) 20 MG tablet Take 20 mg by mouth daily as needed.   vitamin E 400 UNIT capsule Take 400 Units by mouth daily.       01/13/2024   10:33 AM 07/29/2023   10:44 AM 01/25/2023   11:04 AM 10/05/2022    4:01 PM  GAD 7 : Generalized Anxiety Score  Nervous, Anxious, on Edge 0 0 0 0  Control/stop worrying 0 0 0 0  Worry too much - different things 0 0 0 0  Trouble relaxing 0 0 0 0  Restless 0 0 0 0  Easily annoyed or irritable 0 0 0 0  Afraid - awful might happen 0 0 0 0  Total GAD 7 Score 0 0 0 0  Anxiety Difficulty  Not difficult at all Not difficult at all Not difficult at all       01/13/2024   10:33 AM 11/11/2023   10:04 AM 07/29/2023   10:44 AM  Depression screen PHQ 2/9  Decreased Interest 0 0 0  Down, Depressed, Hopeless 0 0 0  PHQ - 2 Score 0 0 0  Altered sleeping   2  Tired, decreased energy   0  Change in appetite   0  Feeling bad or failure about yourself    0  Trouble concentrating   0  Moving slowly or  fidgety/restless   0  Suicidal thoughts   0  PHQ-9 Score   2  Difficult doing work/chores   Not difficult at all    BP Readings from Last 3 Encounters:  01/13/24 122/68  01/03/24 (!) 140/80  08/02/23 114/68    Physical Exam Vitals and nursing note reviewed.  Constitutional:      Appearance: He is well-developed.  HENT:     Head: Normocephalic and atraumatic.     Right Ear: Tympanic membrane, ear canal and external ear normal.     Left Ear: Tympanic membrane, ear canal and  external ear normal.     Nose: Nose normal.     Mouth/Throat:     Dentition: Normal dentition.  Eyes:     General: Lids are normal. No scleral icterus.    Conjunctiva/sclera: Conjunctivae normal.     Pupils: Pupils are equal, round, and reactive to light.  Neck:     Thyroid : No thyromegaly.     Vascular: No carotid bruit, hepatojugular reflux or JVD.     Trachea: No tracheal deviation.  Cardiovascular:     Rate and Rhythm: Normal rate and regular rhythm.     Heart sounds: Normal heart sounds.  Pulmonary:     Effort: Pulmonary effort is normal. No respiratory distress.     Breath sounds: Normal breath sounds. No stridor. No wheezing or rhonchi.  Abdominal:     General: Bowel sounds are normal.     Palpations: Abdomen is soft. There is no hepatomegaly, splenomegaly or mass.     Tenderness: There is no abdominal tenderness. There is no guarding or rebound.     Hernia: There is no hernia in the left inguinal area.  Musculoskeletal:        General: Normal range of motion.     Cervical back: Normal range of motion and neck supple.  Lymphadenopathy:     Cervical: No cervical adenopathy.  Skin:    General: Skin is warm and dry.     Findings: No rash.  Neurological:     Mental Status: He is alert and oriented to person, place, and time.     Sensory: No sensory deficit.     Deep Tendon Reflexes: Reflexes are normal and symmetric.  Psychiatric:        Mood and Affect: Mood is not anxious or depressed.      Wt Readings from Last 3 Encounters:  01/13/24 202 lb 12.8 oz (92 kg)  01/03/24 209 lb 12.8 oz (95.2 kg)  11/11/23 208 lb (94.3 kg)    BP 122/68   Pulse 78   Resp 16   Ht 6\' 3"  (1.905 m)   Wt 202 lb 12.8 oz (92 kg)   SpO2 98%   BMI 25.35 kg/m   Assessment and Plan:     Alayne Allis, MD

## 2024-01-14 LAB — LIPID PANEL WITH LDL/HDL RATIO
Cholesterol, Total: 178 mg/dL (ref 100–199)
HDL: 79 mg/dL (ref >39)
LDL Chol Calc (NIH): 84 mg/dL (ref 0–99)
LDL/HDL Ratio: 1.1 ratio (ref 0.0–3.6)
Triglycerides: 82 mg/dL (ref 0–149)
VLDL Cholesterol Cal: 15 mg/dL (ref 5–40)

## 2024-01-18 ENCOUNTER — Encounter: Payer: Self-pay | Admitting: Family Medicine

## 2024-01-18 ENCOUNTER — Ambulatory Visit (INDEPENDENT_AMBULATORY_CARE_PROVIDER_SITE_OTHER): Admitting: Family Medicine

## 2024-01-18 ENCOUNTER — Other Ambulatory Visit (INDEPENDENT_AMBULATORY_CARE_PROVIDER_SITE_OTHER): Payer: Self-pay | Admitting: Radiology

## 2024-01-18 VITALS — BP 120/78 | HR 69 | Ht 75.0 in | Wt 209.0 lb

## 2024-01-18 DIAGNOSIS — M65331 Trigger finger, right middle finger: Secondary | ICD-10-CM

## 2024-01-18 DIAGNOSIS — M65341 Trigger finger, right ring finger: Secondary | ICD-10-CM | POA: Diagnosis not present

## 2024-01-18 DIAGNOSIS — M65332 Trigger finger, left middle finger: Secondary | ICD-10-CM

## 2024-01-18 MED ORDER — TRIAMCINOLONE ACETONIDE 40 MG/ML IJ SUSP
40.0000 mg | Freq: Once | INTRAMUSCULAR | Status: AC
  Administered 2024-01-18: 40 mg via INTRA_ARTICULAR

## 2024-01-18 NOTE — Progress Notes (Signed)
 Primary Care / Sports Medicine Office Visit  Patient Information:  Patient ID: Seth Stewart., male DOB: 03/09/1941 Age: 83 y.o. MRN: [id number]   Seth Annese Sr. is a pleasant 83 y.o. male presenting with the following:  Chief Complaint  Patient presents with   trigger finger    Patient presents today for trigger finger injections for his right middle finger and ring finger.     Vitals:   01/18/24 1357  BP: 120/78  Pulse: 69  SpO2: 97%   Vitals:   01/18/24 1357  Weight: 209 lb (94.8 kg)  Height: 6\' 3"  (1.905 m)   Body mass index is 26.12 kg/m.  No results found.   Independent interpretation of notes and tests performed by another provider:   None  Procedures performed:   Procedure:  Injection of right third digit under ultrasound guidance. Ultrasound guidance utilized for in-plane approach to flexor tendon at distal palmar crease Samsung HS60 device utilized with permanent recording / reporting. Verbal informed consent obtained and verified. Skin prepped in a sterile fashion. Ethyl chloride for topical local analgesia.  Completed without difficulty and tolerated well. Medication: triamcinolone  acetonide 40 mg/mL suspension for injection 0.5 mL total and 0.25 mL lidocaine  1% without epinephrine utilized for needle placement anesthetic Advised to contact for fevers/chills, erythema, induration, drainage, or persistent bleeding.  Procedure:  Injection of right fourth digit under ultrasound guidance. Ultrasound guidance utilized for intent approach to flexor tendon, no significant sonographic abnormalities noted Samsung HS60 device utilized with permanent recording / reporting. Verbal informed consent obtained and verified. Skin prepped in a sterile fashion. Ethyl chloride for topical local analgesia.  Completed without difficulty and tolerated well. Medication: triamcinolone  acetonide 40 mg/mL suspension for injection 0.5 mL total and 0.25  mL lidocaine  1% without epinephrine utilized for needle placement anesthetic Advised to contact for fevers/chills, erythema, induration, drainage, or persistent bleeding.   Pertinent History, Exam, Impression, and Recommendations:   Problem List Items Addressed This Visit     Trigger finger, right middle finger - Primary   History of Present Illness Sr. Seth Goes Sr. "Seth Stewart" is an 83 year old male who presents with right hand pain and trigger finger symptoms.  Symptoms have been present for four weeks, beginning in [place]. Pain is localized to the right third and fourth digits, with no left hand involvement. The pain and catching significantly impair his ability to play golf. Self-treatment with hot and cold soaks and ball exercises has been ineffective. Symptoms have worsened, with fingers now catching. He is on Eliquis  for atrial fibrillation. No issues with the left hand.  Physical Exam PALPATION: Right hand with less pain on palpation. Trigger finger noted on right hand. No crepitus, effusion, warmth, nodules, or bony abnormalities.  Assessment and Plan Stenosing tenosynovitis (trigger finger) Acute on chronic stenosing tenosynovitis in right third and fourth digits with persistent symptoms. Conservative measures ineffective. Corticosteroid injections planned to reduce inflammation and mechanical symptoms. - Administer corticosteroid injections to right third and fourth digits under ultrasound. - Recommend Voltaren  gel (diclofenac  1%) twice daily until symptoms resolve. - Advise continuation of hot and cold soaks for relief. - Instruct to avoid exacerbating activities until corticosteroid effects noted.      Relevant Orders   US  LIMITED JOINT SPACE STRUCTURES UP RIGHT   Trigger finger, right ring finger   Relevant Orders   US  LIMITED JOINT SPACE STRUCTURES UP RIGHT     Orders & Medications Medications:  Meds ordered this encounter  Medications   triamcinolone   acetonide (KENALOG -40) injection 40 mg   Orders Placed This Encounter  Procedures   US  LIMITED JOINT SPACE STRUCTURES UP RIGHT     No follow-ups on file.     Ma Saupe, MD, [facility]   Primary Care Sports Medicine Primary Care and Sports Medicine at [facility]

## 2024-01-18 NOTE — Progress Notes (Unsigned)
 Electrophysiology Office Note:    Date:  01/19/2024   ID:  Seth Seminario Sr., DOB 02/17/1941, MRN [id number]  [facility] Cardiologist:  Antionette Kirks, MD  [facility] Electrophysiologist:  Boyce Byes, MD   Referring MD: Morey Ar, NP   Chief Complaint: Atrial fibrillation  History of Present Illness:    Mr. Seth Stewart is an 83 year old man who I am seeing today for an evaluation of atrial fibrillation at the request of Seth Hires, NP.  The patient has a history of atrial fibrillation and flutter with prior flutter ablation at [facility], hyperlipidemia and nonobstructive coronary artery disease.  He follows with Dr. Alvenia Aus.  His atrial flutter was diagnosed in 2013.  He has required cardioversions in the past.  He was on amiodarone .  He has not been on anticoagulation for years because of abnormal blood work.  He was in atrial fibrillation at his appointment with Seth Stewart.  He is being referred to discuss rhythm control given fatigue while in atrial fibrillation.  He is with his wife today in clinic.  He is quite active.  He plays golf regularly.  He did just have an injection on his right hand for trigger finger.  He is unsure whether or not he can tell when he is in atrial fibrillation.  He may feel flushed at times and attributes this to his arrhythmia.  He continues to take amiodarone  100 mg by mouth once daily.  He has been taking Eliquis  twice daily for stroke prophylaxis without missed doses since his appointment with Seth Stewart on April 14.       Their past medical, social and family history was reviewed.   ROS:   Please see the history of present illness.    All other systems reviewed and are negative.  EKGs/Labs/Other Studies Reviewed:    The following studies were reviewed today:  January 03, 2024 EKG shows atrial fibrillation.  May 09, 2019 EKG shows sinus rhythm.       Physical Exam:    VS:  BP 138/84   Pulse (!) 54   Ht 6'  3" (1.905 m)   Wt 206 lb (93.4 kg)   SpO2 97%   BMI 25.75 kg/m     Wt Readings from Last 3 Encounters:  01/19/24 206 lb (93.4 kg)  01/18/24 209 lb (94.8 kg)  01/13/24 202 lb 12.8 oz (92 kg)     GEN: no distress.  Appears younger than stated age. CARD: Irregularly irregular, No MRG RESP: No IWOB. CTAB.        ASSESSMENT AND PLAN:    1. Persistent atrial fibrillation (HCC)   2. Atrial flutter, unspecified type (HCC)     #Atrial fibrillation #History of atrial flutter The patient has symptomatic atrial fibrillation with fatigue.  He also carries a diagnosis of atrial flutter and had a catheter ablation at [facility] greater than 10 years ago.  He has been maintained on amiodarone  100 mg by mouth daily.  At the appoint with Seth Stewart he was started on Eliquis  in anticipation of a cardioversion/ablation.  I would like to go ahead and get him cardioverted to see if he will maintain sinus rhythm.  In preparation for this I would like to him to increase his amiodarone  to 400 mg by mouth daily for the next 7 days. Discussed the cardioversion procedure in detail with the patient including the risks and likelihood of success and he wishes to proceed.  Also discussed catheter ablation with the patient during today's  clinic appointment.  I discussed the catheter ablation procedure in detail with patient including the risks, recovery and likelihood of success.  If he maintains normal rhythm when I see him back in about 6 weeks, favor proceeding with catheter ablation for more durable rhythm control and to provide an opportunity to stop amiodarone .  Discussed treatment options today for AF including antiarrhythmic drug therapy and ablation. Discussed risks, recovery and likelihood of success with each treatment strategy. Risk, benefits, and alternatives to EP study and ablation for afib were discussed. These risks include but are not limited to stroke, bleeding, vascular damage, tamponade, perforation,  damage to the esophagus, lungs, phrenic nerve and other structures, pulmonary vein stenosis, worsening renal function, coronary vasospasm and death.  Discussed potential need for repeat ablation procedures and antiarrhythmic drugs after an initial ablation. The patient understands these risk and wishes to proceed.  We will therefore proceed with catheter ablation at the next available time.  Carto, ICE, anesthesia are requested for the procedure.  Will also obtain CT PV protocol prior to the procedure to exclude LAA thrombus and further evaluate atrial anatomy.  He will continue Eliquis  5 mg by mouth twice daily.  Ablation strategy will be PVI plus posterior wall plus CTI.   Cardioversion then follow-up with me in 6 weeks.   Signed, Leanora Prophet. Marven Slimmer, MD, [facility], [facility] 01/19/2024 9:48 AM    Electrophysiology [facility]

## 2024-01-18 NOTE — H&P (View-Only) (Signed)
 Electrophysiology Office Note:    Date:  01/19/2024   ID:  Seth Seminario Sr., DOB 02/17/1941, MRN [id number]  [facility] Cardiologist:  Antionette Kirks, MD  [facility] Electrophysiologist:  Boyce Byes, MD   Referring MD: Morey Ar, NP   Chief Complaint: Atrial fibrillation  History of Present Illness:    Seth Stewart is an 83 year old man who I am seeing today for an evaluation of atrial fibrillation at the request of Evelyne Hires, NP.  The patient has a history of atrial fibrillation and flutter with prior flutter ablation at [facility], hyperlipidemia and nonobstructive coronary artery disease.  He follows with Dr. Alvenia Aus.  His atrial flutter was diagnosed in 2013.  He has required cardioversions in the past.  He was on amiodarone .  He has not been on anticoagulation for years because of abnormal blood work.  He was in atrial fibrillation at his appointment with Bernardo Bridgeman.  He is being referred to discuss rhythm control given fatigue while in atrial fibrillation.  He is with his wife today in clinic.  He is quite active.  He plays golf regularly.  He did just have an injection on his right hand for trigger finger.  He is unsure whether or not he can tell when he is in atrial fibrillation.  He may feel flushed at times and attributes this to his arrhythmia.  He continues to take amiodarone  100 mg by mouth once daily.  He has been taking Eliquis  twice daily for stroke prophylaxis without missed doses since his appointment with Bernardo Bridgeman on April 14.       Their past medical, social and family history was reviewed.   ROS:   Please see the history of present illness.    All other systems reviewed and are negative.  EKGs/Labs/Other Studies Reviewed:    The following studies were reviewed today:  January 03, 2024 EKG shows atrial fibrillation.  May 09, 2019 EKG shows sinus rhythm.       Physical Exam:    VS:  BP 138/84   Pulse (!) 54   Ht 6'  3" (1.905 m)   Wt 206 lb (93.4 kg)   SpO2 97%   BMI 25.75 kg/m     Wt Readings from Last 3 Encounters:  01/19/24 206 lb (93.4 kg)  01/18/24 209 lb (94.8 kg)  01/13/24 202 lb 12.8 oz (92 kg)     GEN: no distress.  Appears younger than stated age. CARD: Irregularly irregular, No MRG RESP: No IWOB. CTAB.        ASSESSMENT AND PLAN:    1. Persistent atrial fibrillation (HCC)   2. Atrial flutter, unspecified type (HCC)     #Atrial fibrillation #History of atrial flutter The patient has symptomatic atrial fibrillation with fatigue.  He also carries a diagnosis of atrial flutter and had a catheter ablation at [facility] greater than 10 years ago.  He has been maintained on amiodarone  100 mg by mouth daily.  At the appoint with Bernardo Bridgeman he was started on Eliquis  in anticipation of a cardioversion/ablation.  I would like to go ahead and get him cardioverted to see if he will maintain sinus rhythm.  In preparation for this I would like to him to increase his amiodarone  to 400 mg by mouth daily for the next 7 days. Discussed the cardioversion procedure in detail with the patient including the risks and likelihood of success and he wishes to proceed.  Also discussed catheter ablation with the patient during today's  clinic appointment.  I discussed the catheter ablation procedure in detail with patient including the risks, recovery and likelihood of success.  If he maintains normal rhythm when I see him back in about 6 weeks, favor proceeding with catheter ablation for more durable rhythm control and to provide an opportunity to stop amiodarone .  Discussed treatment options today for AF including antiarrhythmic drug therapy and ablation. Discussed risks, recovery and likelihood of success with each treatment strategy. Risk, benefits, and alternatives to EP study and ablation for afib were discussed. These risks include but are not limited to stroke, bleeding, vascular damage, tamponade, perforation,  damage to the esophagus, lungs, phrenic nerve and other structures, pulmonary vein stenosis, worsening renal function, coronary vasospasm and death.  Discussed potential need for repeat ablation procedures and antiarrhythmic drugs after an initial ablation. The patient understands these risk and wishes to proceed.  We will therefore proceed with catheter ablation at the next available time.  Carto, ICE, anesthesia are requested for the procedure.  Will also obtain CT PV protocol prior to the procedure to exclude LAA thrombus and further evaluate atrial anatomy.  He will continue Eliquis  5 mg by mouth twice daily.  Ablation strategy will be PVI plus posterior wall plus CTI.   Cardioversion then follow-up with me in 6 weeks.   Signed, Leanora Prophet. Marven Slimmer, MD, [facility], [facility] 01/19/2024 9:48 AM    Electrophysiology [facility]

## 2024-01-18 NOTE — Assessment & Plan Note (Addendum)
 History of Present Illness Sr. Shuayb Birschbach Sr. "Athena Bland" is an 83 year old male who presents with right hand pain and trigger finger symptoms.  Symptoms have been present for four weeks, beginning in [place]. Pain is localized to the right third and fourth digits, with no left hand involvement. The pain and catching significantly impair his ability to play golf. Self-treatment with hot and cold soaks and ball exercises has been ineffective. Symptoms have worsened, with fingers now catching. He is on Eliquis  for atrial fibrillation. No issues with the left hand.  Physical Exam PALPATION: Right hand with less pain on palpation. Trigger finger noted on right hand. No crepitus, effusion, warmth, nodules, or bony abnormalities.  Assessment and Plan Stenosing tenosynovitis (trigger finger) Acute on chronic stenosing tenosynovitis in right third and fourth digits with persistent symptoms. Conservative measures ineffective. Corticosteroid injections planned to reduce inflammation and mechanical symptoms. - Administer corticosteroid injections to right third and fourth digits under ultrasound. - Recommend Voltaren  gel (diclofenac  1%) twice daily until symptoms resolve. - Advise continuation of hot and cold soaks for relief. - Instruct to avoid exacerbating activities until corticosteroid effects noted.

## 2024-01-18 NOTE — Patient Instructions (Addendum)
 You have just been given a cortisone injection to reduce pain and inflammation. After the injection you may notice immediate relief of pain as a result of the Lidocaine . It is important to rest the area of the injection for 24 to 48 hours after the injection. There is a possibility of some temporary increased discomfort and swelling for up to 72 hours until the cortisone begins to work. If you do have pain, simply rest the joint and use ice. If you can tolerate over the counter medications, you can try Tylenol , Aleve , or Advil for added relief per package instructions.  Patient Plan  1. Receive corticosteroid injections in the right third and fourth fingers. 2. Apply Voltaren  gel (diclofenac  1%) to the affected fingers twice daily until symptoms improve. 3. Continue using hot and cold soaks for symptom relief. 4. Avoid activities that worsen symptoms until the effects of the corticosteroid injections are noticeable.  Red Flags:  - If you experience increased pain, swelling, or difficulty moving your fingers, contact your healthcare provider immediately.

## 2024-01-19 ENCOUNTER — Encounter: Payer: Self-pay | Admitting: Cardiology

## 2024-01-19 ENCOUNTER — Ambulatory Visit: Attending: Cardiology | Admitting: Cardiology

## 2024-01-19 ENCOUNTER — Other Ambulatory Visit: Payer: Self-pay

## 2024-01-19 VITALS — BP 138/84 | HR 54 | Ht 75.0 in | Wt 206.0 lb

## 2024-01-19 DIAGNOSIS — I4892 Unspecified atrial flutter: Secondary | ICD-10-CM | POA: Diagnosis not present

## 2024-01-19 DIAGNOSIS — I4819 Other persistent atrial fibrillation: Secondary | ICD-10-CM

## 2024-01-19 NOTE — Patient Instructions (Addendum)
 Medication Instructions:  Your physician has recommended you make the following change in your medication:  1) INCREASE amiodarone  to 400 mg daily for 7 days, then decrease back to 100 mg once daily.   *If you need a refill on your cardiac medications before your next appointment, please call your pharmacy*  Lab Work: TODAY: BMET and CBC  Testing/Procedures: Cardioversion  Your physician has recommended that you have a Cardioversion (DCCV). Electrical Cardioversion uses a jolt of electricity to your heart either through paddles or wired patches attached to your chest. This is a controlled, usually prescheduled, procedure. Defibrillation is done under light anesthesia in the hospital, and you usually go home the day of the procedure. This is done to get your heart back into a normal rhythm. You are not awake for the procedure.  Please see the instruction sheet given to you today.  Ablation  Your physician has recommended that you have an ablation. Catheter ablation is a medical procedure used to treat some cardiac arrhythmias (irregular heartbeats). During catheter ablation, a long, thin, flexible tube is put into a blood vessel in your groin (upper thigh), or neck. This tube is called an ablation catheter. It is then guided to your heart through the blood vessel. Radio frequency waves destroy small areas of heart tissue where abnormal heartbeats may cause an arrhythmia to start.  You are scheduled for Atrial Fibrillation Ablation on Monday, July 14 with Dr. Harvie Liner.Please arrive at the Main Entrance A at [facility]: [address] [place], [place] [address] at 8:00 AM    Follow-Up: At [facility], you and your health needs are our priority.  As part of our continuing mission to provide you with exceptional heart care, our providers are all part of one team.  This team includes your primary Cardiologist (physician) and Advanced Practice Providers or APPs (Physician  Assistants and Nurse Practitioners) who all work together to provide you with the care you need, when you need it.  Your next appointment:   June 11th at 10:20am  Provider:   Harvie Liner, MD

## 2024-01-20 ENCOUNTER — Ambulatory Visit: Payer: Medicare Other | Admitting: Cardiovascular Disease

## 2024-01-20 LAB — CBC
Hematocrit: 42.8 % (ref 37.5–51.0)
Hemoglobin: 14.3 g/dL (ref 13.0–17.7)
MCH: 32.1 pg (ref 26.6–33.0)
MCHC: 33.4 g/dL (ref 31.5–35.7)
MCV: 96 fL (ref 79–97)
Platelets: 212 10*3/uL (ref 150–450)
RBC: 4.46 x10E6/uL (ref 4.14–5.80)
RDW: 12.5 % (ref 11.6–15.4)
WBC: 9.6 10*3/uL (ref 3.4–10.8)

## 2024-01-20 LAB — BASIC METABOLIC PANEL WITH GFR
BUN/Creatinine Ratio: 14 (ref 10–24)
BUN: 11 mg/dL (ref 8–27)
CO2: 18 mmol/L — ABNORMAL LOW (ref 20–29)
Calcium: 10 mg/dL (ref 8.6–10.2)
Chloride: 100 mmol/L (ref 96–106)
Creatinine, Ser: 0.8 mg/dL (ref 0.76–1.27)
Glucose: 105 mg/dL — ABNORMAL HIGH (ref 70–99)
Potassium: 4.6 mmol/L (ref 3.5–5.2)
Sodium: 135 mmol/L (ref 134–144)
eGFR: 88 mL/min/{1.73_m2} (ref >59)

## 2024-01-25 ENCOUNTER — Ambulatory Visit: Admitting: Internal Medicine

## 2024-01-25 ENCOUNTER — Encounter: Payer: Self-pay | Admitting: Internal Medicine

## 2024-01-25 VITALS — BP 130/80 | HR 67 | Temp 97.8°F | Ht 75.0 in | Wt 206.7 lb

## 2024-01-25 DIAGNOSIS — G4719 Other hypersomnia: Secondary | ICD-10-CM | POA: Diagnosis not present

## 2024-01-25 DIAGNOSIS — I4891 Unspecified atrial fibrillation: Secondary | ICD-10-CM | POA: Diagnosis not present

## 2024-01-25 NOTE — Progress Notes (Signed)
 Name: Seth Sadek Sr. MRN: [id number] DOB: 10-29-1940    CHIEF COMPLAINT:  EXCESSIVE DAYTIME SLEEPINESS   HISTORY OF PRESENT ILLNESS: Patient is seen today for problems and issues with sleep related to excessive daytime sleepiness Patient  has been having sleep problems for many years Patient has been having excessive daytime sleepiness for a long time Patient has been having extreme fatigue and tiredness, lack of energy  Discussed sleep data and reviewed with patient.  Encouraged proper weight management.  Discussed driving precautions and its relationship with hypersomnolence.  Discussed operating dangerous equipment and its relationship with hypersomnolence.  Discussed sleep hygiene, and benefits of a fixed sleep waked time.  The importance of getting eight or more hours of sleep discussed with patient.  Discussed limiting the use of the computer and television before bedtime.  Decrease naps during the day, so night time sleep will become enhanced.  Limit caffeine, and sleep deprivation.  HTN, stroke, and heart failure are potential risk factors.    EPWORTH SLEEP SCORE 10 Patient diagnosed with atrial fibrillation 15 years ago Patient currently on amiodarone  therapy and anticoagulation therapy Cardiology referred to us  to assess for sleep apnea Patient has signs symptoms of sleep apnea therefore will need home sleep test for definitive diagnosis  I would also recommend obtaining pulmonary function testing however patient has several cardiac procedures per planned for this month Patient is to obtain echocardiogram,electrical cardioversioand possible ablation therapy Patient has been on amiodarone  for the last 10 to 15 years  No exacerbation at this time No evidence of heart failure at this time No evidence or signs of infection at this time No respiratory distress No fevers, chills, nausea, vomiting, diarrhea No evidence of lower extremity edema No evidence  hemoptysis  Non-smoker Occasional alcoholic Extensive traveling history Former Forensic scientist graduated Engineer, 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  has a past surgical history that includes Eye surgery (09/21/2009); Laminectomy (09/21/2002); Colonoscopy (12/06/2014); Ablation; Hernia repair (Right, 01/07/2015); Esophagogastroduodenoscopy (N/A, 10/29/2022); Back surgery; Esophagogastroduodenoscopy; and Esophagogastroduodenoscopy (egd) with propofol  (N/A, 12/04/2022). Prior to Admission medications   Medication Sig Start Date End Date Taking? Authorizing Provider  amiodarone  (PACERONE ) 100 MG tablet Take 1 tablet (100 mg total) by mouth daily. 01/13/24  Yes Clarise Crooks, MD  apixaban  (ELIQUIS ) 5 MG TABS tablet Take 1 tablet (5 mg total) by mouth 2 (two) times daily. 01/03/24  Yes Wittenborn, Bernardo Bridgeman, NP  Ascorbic Acid (VITAMIN C) 1000 MG tablet Take 1,000 mg by mouth daily.   Yes [provider]  Flaxseed, Linseed, (FLAX SEEDS PO) Take 2 Scoops by mouth daily.   Yes [provider]  Lactobacillus (PROBIOTIC ACIDOPHILUS PO) Take 1 capsule by mouth.   Yes [provider]  metoprolol  tartrate (LOPRESSOR ) 25 MG tablet TAKE 1 TABLET BY MOUTH TWICE DAILY 01/03/24  Yes Wenona Hamilton, MD  Multiple Vitamins-Minerals (MULTIVITAMIN WITH MINERALS) tablet Take 1 tablet by mouth daily.   Yes [provider]  Omega-3 1000 MG CAPS Take 1 capsule by mouth 2 (two) times daily. otc   Yes [provider]  rosuvastatin  (CRESTOR ) 5 MG tablet Take 1 tablet (5 mg total) by mouth daily. 01/13/24  Yes Clarise Crooks, MD  tadalafil  (CIALIS ) 20 MG tablet Take 20 mg by mouth  daily as needed. 11/25/20  Yes [provider]  vitamin E 400 UNIT  capsule Take 400 Units by mouth daily.   Yes [provider]  pantoprazole (PROTONIX) 40 MG tablet Take 40 mg by mouth daily. 11/03/22 01/19/24  [provider]   No Known Allergies  FAMILY HISTORY:  family history includes Cancer in his mother; Heart attack in his father and paternal uncle; Heart disease in his father. SOCIAL HISTORY:  reports that he has never smoked. He has never used smokeless tobacco. He reports current alcohol use of about 7.0 - 14.0 standard drinks of alcohol per week. He reports that he does not use drugs.   BP 130/80 (BP Location: Right Arm, Patient Position: Sitting, Cuff Size: Normal)   Pulse 67   Temp 97.8 F (36.6 C) (Oral)   Ht 6\' 3"  (1.905 m)   Wt 206 lb 11.2 oz (93.8 kg)   SpO2 97%   BMI 25.84 kg/m    Review of Systems: Gen:  Denies  fever, sweats, chills weight loss  HEENT: Denies blurred vision, double vision, ear pain, eye pain, hearing loss, nose bleeds, sore throat Cardiac:  No dizziness, chest pain or heaviness, chest tightness,edema, No JVD Resp:   No cough, -sputum production, -shortness of breath,-wheezing, -hemoptysis,  Other:  All other systems negative   Physical Examination:   General Appearance: No distress  EYES PERRLA, EOM intact.   NECK Supple, No JVD Pulmonary: normal breath sounds, No wheezing.  CardiovascularNormal S1,S2.  No m/r/g.   Abdomen: Benign, Soft, non-tender. Neurology UE/LE 5/5 strength, no focal deficits Ext pulses intact, cap refill intact ALL OTHER ROS ARE NEGATIVE   ASSESSMENT AND PLAN SYNOPSIS  83 year old pleasant white male seen today for ongoing atrial fibrillation with possible signs symptoms of obstructive sleep apnea   Recommend Sleep Study for definitve diagnosis Recommend home sleep study  Recommend obtaining pulmonary function testing Will await echocardiogram electrical cardioversion and ablation therapy if needed PFTs will be ordered once that is completed   CURRENT  MEDICATIONS REVIEWED AT LENGTH WITH PATIENT TODAY   Patient  satisfied with Plan of action and management. All questions answered  Follow up 4 weeks   I spent a total of  60 minutes reviewing chart data, face-to-face evaluation with the patient, counseling and coordination of care as detailed above.    Lady Pier, M.D.  [facility]  Medical Director [facility] Medical Director [facility] Cardio-Pulmonary Department

## 2024-01-25 NOTE — Patient Instructions (Signed)
 Recommend home sleep test to assess for sleep apnea  Follow-up with cardiology for all your procedures  Avoid Allergens and Irritants Avoid secondhand smoke Avoid SICK contacts Recommend  Masking  when appropriate Recommend Keep up-to-date with vaccinations

## 2024-02-01 ENCOUNTER — Ambulatory Visit: Attending: Student

## 2024-02-01 DIAGNOSIS — I2510 Atherosclerotic heart disease of native coronary artery without angina pectoris: Secondary | ICD-10-CM | POA: Diagnosis not present

## 2024-02-01 DIAGNOSIS — I480 Paroxysmal atrial fibrillation: Secondary | ICD-10-CM | POA: Insufficient documentation

## 2024-02-02 ENCOUNTER — Ambulatory Visit: Payer: Self-pay | Admitting: Student

## 2024-02-02 LAB — ECHOCARDIOGRAM COMPLETE
AR max vel: 2.91 cm2
AV Area VTI: 2.3 cm2
AV Area mean vel: 2.81 cm2
AV Mean grad: 4 mmHg
AV Peak grad: 6.6 mmHg
Ao pk vel: 1.29 m/s
Calc EF: 57.4 %
S' Lateral: 4 cm
Single Plane A2C EF: 58.4 %
Single Plane A4C EF: 59.1 %

## 2024-02-08 ENCOUNTER — Telehealth: Payer: Self-pay | Admitting: *Deleted

## 2024-02-08 MED ORDER — SODIUM CHLORIDE 0.9 % IV SOLN: INTRAVENOUS | Status: DC

## 2024-02-08 NOTE — Telephone Encounter (Signed)
 Confirmed arrival time, instructions, and location for his DCCV Procedure tomorrow. He verbalized understanding with no further questions at this time.

## 2024-02-09 ENCOUNTER — Other Ambulatory Visit: Payer: Self-pay

## 2024-02-09 ENCOUNTER — Ambulatory Visit: Admitting: Internal Medicine

## 2024-02-09 ENCOUNTER — Encounter: Payer: Self-pay | Admitting: Cardiovascular Disease

## 2024-02-09 ENCOUNTER — Ambulatory Visit: Admitting: Anesthesiology

## 2024-02-09 ENCOUNTER — Ambulatory Visit
Admission: RE | Admit: 2024-02-09 | Discharge: 2024-02-09 | Disposition: A | Discharge status: Home / Self Care | Attending: Cardiovascular Disease | Admitting: Cardiovascular Disease

## 2024-02-09 ENCOUNTER — Encounter
Admission: RE | Disposition: A | Discharge status: Home / Self Care | Payer: Self-pay | Source: Home / Self Care | Attending: Cardiovascular Disease

## 2024-02-09 ENCOUNTER — Encounter

## 2024-02-09 DIAGNOSIS — I491 Atrial premature depolarization: Secondary | ICD-10-CM | POA: Insufficient documentation

## 2024-02-09 DIAGNOSIS — R0602 Shortness of breath: Secondary | ICD-10-CM

## 2024-02-09 DIAGNOSIS — I4819 Other persistent atrial fibrillation: Secondary | ICD-10-CM | POA: Insufficient documentation

## 2024-02-09 DIAGNOSIS — Z7901 Long term (current) use of anticoagulants: Secondary | ICD-10-CM | POA: Insufficient documentation

## 2024-02-09 DIAGNOSIS — I4891 Unspecified atrial fibrillation: Secondary | ICD-10-CM | POA: Diagnosis not present

## 2024-02-09 DIAGNOSIS — G4733 Obstructive sleep apnea (adult) (pediatric): Secondary | ICD-10-CM | POA: Diagnosis not present

## 2024-02-09 DIAGNOSIS — I2510 Atherosclerotic heart disease of native coronary artery without angina pectoris: Secondary | ICD-10-CM | POA: Insufficient documentation

## 2024-02-09 DIAGNOSIS — I10 Essential (primary) hypertension: Secondary | ICD-10-CM | POA: Diagnosis not present

## 2024-02-09 DIAGNOSIS — R001 Bradycardia, unspecified: Secondary | ICD-10-CM | POA: Diagnosis not present

## 2024-02-09 DIAGNOSIS — G4719 Other hypersomnia: Secondary | ICD-10-CM

## 2024-02-09 DIAGNOSIS — Z79899 Other long term (current) drug therapy: Secondary | ICD-10-CM | POA: Diagnosis not present

## 2024-02-09 DIAGNOSIS — Z01818 Encounter for other preprocedural examination: Secondary | ICD-10-CM

## 2024-02-09 DIAGNOSIS — E785 Hyperlipidemia, unspecified: Secondary | ICD-10-CM | POA: Diagnosis not present

## 2024-02-09 HISTORY — PX: CARDIOVERSION: SHX1299

## 2024-02-09 SURGERY — CARDIOVERSION
Anesthesia: General

## 2024-02-09 MED ORDER — PROPOFOL 10 MG/ML IV BOLUS
INTRAVENOUS | Status: DC | PRN
  Administered 2024-02-09: 50 mg via INTRAVENOUS

## 2024-02-09 MED ORDER — PROPOFOL 10 MG/ML IV BOLUS
INTRAVENOUS | Status: AC
  Filled 2024-02-09: qty 40

## 2024-02-09 NOTE — Transfer of Care (Signed)
 Immediate Anesthesia Transfer of Care Note  Patient: Seth Alexopoulos Sr.  Procedure(s) Performed: CARDIOVERSION  Patient Location: PACU and special recoveries  Anesthesia Type:General  Level of Consciousness: drowsy and patient cooperative  Airway & Oxygen Therapy: Patient Spontanous Breathing and Patient connected to nasal cannula oxygen  Post-op Assessment: Report given to RN and Post -op Vital signs reviewed and stable  Post vital signs: Reviewed and stable  Last Vitals:  Vitals Value Taken Time  BP 129/77 02/09/24 0745  Temp    Pulse 48 02/09/24 0747  Resp 17 02/09/24 0747  SpO2 96 % 02/09/24 0747  Vitals shown include unfiled device data.  Last Pain:  Vitals:   02/09/24 0646  TempSrc: Oral         Complications: No notable events documented.

## 2024-02-09 NOTE — Anesthesia Procedure Notes (Signed)
 Procedure Name: MAC Date/Time: 02/09/2024 7:28 AM  Performed by: Orin Birk, CRNAPre-anesthesia Checklist: Emergency Drugs available, Patient identified, Suction available and Patient being monitored Patient Re-evaluated:Patient Re-evaluated prior to induction Oxygen Delivery Method: Nasal cannula

## 2024-02-09 NOTE — Anesthesia Postprocedure Evaluation (Signed)
 Anesthesia Post Note  Patient: Seth Eberwein Sr.  Procedure(s) Performed: CARDIOVERSION  Patient location during evaluation: Specials Recovery Anesthesia Type: General Level of consciousness: awake and alert Pain management: pain level controlled Vital Signs Assessment: post-procedure vital signs reviewed and stable Respiratory status: spontaneous breathing, nonlabored ventilation, respiratory function stable and patient connected to nasal cannula oxygen Cardiovascular status: blood pressure returned to baseline and stable Postop Assessment: no apparent nausea or vomiting Anesthetic complications: no   No notable events documented.   Last Vitals:  Vitals:   02/09/24 0750 02/09/24 0805  BP: 132/63 123/77  Pulse: (!) 50 (!) 52  Resp: 16 (!) 22  Temp:    SpO2: 96% 96%    Last Pain:  Vitals:   02/09/24 0805  TempSrc:   PainSc: 0-No pain                 Lattie Poli

## 2024-02-09 NOTE — Interval H&P Note (Signed)
 History and Physical Interval Note:  02/09/2024 6:39 PM  Seth Goes Sr.  has presented today for surgery, with the diagnosis of Cardioversion   Afib.  The various methods of treatment have been discussed with the patient and family. After consideration of risks, benefits and other options for treatment, the patient has consented to  Procedure(s): CARDIOVERSION (N/A) as a surgical intervention.  The patient's history has been reviewed, patient examined, no change in status, stable for surgery.  I have reviewed the patient's chart and labs.  Questions were answered to the patient's satisfaction.     [name]

## 2024-02-09 NOTE — Anesthesia Preprocedure Evaluation (Signed)
 Anesthesia Evaluation  Patient identified by MRN, date of birth, ID band Patient awake    Reviewed: Allergy & Precautions, NPO status , Patient's Chart, lab work & pertinent test results  History of Anesthesia Complications Negative for: history of anesthetic complications  Airway Mallampati: II  TM Distance: >3 FB Neck ROM: Full    Dental  (+) Chipped   Pulmonary neg pulmonary ROS, neg sleep apnea, neg COPD, Patient abstained from smoking.Not current smoker   Pulmonary exam normal breath sounds clear to auscultation       Cardiovascular Exercise Tolerance: Good METShypertension, Pt. on medications (-) CAD and (-) Past MI + dysrhythmias Atrial Fibrillation  Rhythm:Irregular Rate:Normal - Systolic murmurs    Neuro/Psych negative neurological ROS  negative psych ROS   GI/Hepatic ,neg GERD  ,,(+)     substance abuse  alcohol use  Endo/Other  neg diabetes    Renal/GU negative Renal ROS     Musculoskeletal   Abdominal   Peds  Hematology   Anesthesia Other Findings Past Medical History: No date: AF (atrial fibrillation) (HCC) No date: Back pain No date: Dysphagia No date: Dysrhythmia No date: Hemorrhoids No date: Hyperlipidemia No date: Hypertension  Reproductive/Obstetrics                             Anesthesia Physical Anesthesia Plan  ASA: 3  Anesthesia Plan: General   Post-op Pain Management: Minimal or no pain anticipated   Induction: Intravenous  PONV Risk Score and Plan: 2 and Propofol  infusion, TIVA and Ondansetron   Airway Management Planned: Nasal Cannula  Additional Equipment: None  Intra-op Plan:   Post-operative Plan:   Informed Consent: I have reviewed the patients History and Physical, chart, labs and discussed the procedure including the risks, benefits and alternatives for the proposed anesthesia with the patient or authorized representative who has  indicated his/her understanding and acceptance.     Dental advisory given  Plan Discussed with: CRNA and Surgeon  Anesthesia Plan Comments: (Discussed risks of anesthesia with patient, including possibility of difficulty with spontaneous ventilation under anesthesia necessitating airway intervention, PONV, and rare risks such as cardiac or respiratory or neurological events, and allergic reactions. Discussed the role of CRNA in patient's perioperative care. Patient understands.)       Anesthesia Quick Evaluation

## 2024-02-09 NOTE — H&P (Signed)

## 2024-02-09 NOTE — CV Procedure (Signed)
Cardioversion procedure note For atrial fibrillation, persistent.  Procedure Details:  Consent: Risks of procedure as well as the alternatives and risks of each were explained to the (patient/caregiver).  Consent for procedure obtained.  Time Out: Verified patient identification, verified procedure, site/side was marked, verified correct patient position, special equipment/implants available, medications/allergies/relevent history reviewed, required imaging and test results available.  Performed  Patient placed on cardiac monitor, pulse oximetry, supplemental oxygen as necessary.   Sedation given: propofol IV, Dr. Bertell Maria Pacer pads placed anterior and posterior chest.   Cardioverted 1 time(s).   Cardioverted at  150 J. Synchronized biphasic Converted to NSR   Evaluation: Findings: Post procedure EKG shows: NSR Complications: None Patient did tolerate procedure well.  Time Spent Directly with the Patient:  52 minutes   Esmond Plants, M.D., Ph.D.

## 2024-02-10 ENCOUNTER — Encounter: Payer: Self-pay | Admitting: Cardiovascular Disease

## 2024-02-15 ENCOUNTER — Telehealth: Payer: Self-pay

## 2024-02-15 NOTE — Telephone Encounter (Signed)
 Telephone call from patient stating that he is ready for injections. The last offfice note says that he would call for face to face visits. But there is an order in, is it ok to schedule, or does he need an evaluation first?

## 2024-02-16 DIAGNOSIS — D225 Melanocytic nevi of trunk: Secondary | ICD-10-CM | POA: Diagnosis not present

## 2024-02-16 DIAGNOSIS — L905 Scar conditions and fibrosis of skin: Secondary | ICD-10-CM | POA: Diagnosis not present

## 2024-02-21 ENCOUNTER — Ambulatory Visit
Attending: Student in an Organized Health Care Education/Training Program | Admitting: Student in an Organized Health Care Education/Training Program

## 2024-02-21 ENCOUNTER — Telehealth: Payer: Self-pay

## 2024-02-21 ENCOUNTER — Encounter: Payer: Self-pay | Admitting: Student in an Organized Health Care Education/Training Program

## 2024-02-21 ENCOUNTER — Other Ambulatory Visit: Payer: Self-pay

## 2024-02-21 DIAGNOSIS — G8929 Other chronic pain: Secondary | ICD-10-CM | POA: Insufficient documentation

## 2024-02-21 DIAGNOSIS — G4733 Obstructive sleep apnea (adult) (pediatric): Secondary | ICD-10-CM | POA: Diagnosis not present

## 2024-02-21 DIAGNOSIS — M5414 Radiculopathy, thoracic region: Secondary | ICD-10-CM | POA: Insufficient documentation

## 2024-02-21 DIAGNOSIS — M5416 Radiculopathy, lumbar region: Secondary | ICD-10-CM | POA: Insufficient documentation

## 2024-02-21 DIAGNOSIS — G894 Chronic pain syndrome: Secondary | ICD-10-CM | POA: Diagnosis not present

## 2024-02-21 DIAGNOSIS — I4819 Other persistent atrial fibrillation: Secondary | ICD-10-CM

## 2024-02-21 DIAGNOSIS — M961 Postlaminectomy syndrome, not elsewhere classified: Secondary | ICD-10-CM | POA: Diagnosis not present

## 2024-02-21 MED ORDER — MIDAZOLAM HCL 2 MG/2ML IJ SOLN
INTRAMUSCULAR | Status: AC
  Filled 2024-02-21: qty 2

## 2024-02-21 MED ORDER — ROPIVACAINE HCL 2 MG/ML IJ SOLN
INTRAMUSCULAR | Status: AC
  Filled 2024-02-21: qty 20

## 2024-02-21 MED ORDER — LIDOCAINE HCL 2 % IJ SOLN
INTRAMUSCULAR | Status: AC
  Filled 2024-02-21: qty 20

## 2024-02-21 MED ORDER — DEXAMETHASONE SODIUM PHOSPHATE 10 MG/ML IJ SOLN
INTRAMUSCULAR | Status: AC
  Filled 2024-02-21: qty 1

## 2024-02-21 NOTE — Progress Notes (Signed)
 PROVIDER NOTE: Interpretation of information contained herein should be left to medically-trained personnel. Specific patient instructions are provided elsewhere under "Patient Instructions" section of medical record. This document was created in part using AI and STT-dictation technology, any transcriptional errors that may result from this process are unintentional.  Patient: Seth Goes Sr.  Service: E/M   PCP: Seth Crooks, MD  DOB: 1941/01/15  DOS: 02/21/2024  Provider: Cephus Collin, MD  MRN: [id number]  Delivery: Face-to-face  Specialty: Interventional Pain Management  Type: Established Patient  Setting: Ambulatory outpatient facility  Specialty designation: 09  Referring Prov.: Seth Collin, MD  Location: Outpatient office facility       History of present illness (HPI) Mr. Seth Agresta Sr., a 83 y.o. year old male, is here today because of his Chronic radicular lumbar pain [M54.16, G89.29]. Seth Stewart primary complain today is thoracic and lumbar pain   Vitals:  vitals were not taken for this visit.  BMI: Estimated body mass index is 25.84 kg/m as calculated from the following:   Height as of 01/25/24: 6\' 3"  (1.905 m).   Weight as of 01/25/24: 206 lb 11.2 oz (93.8 kg).  Last encounter: 08/02/2023. Last procedure: 06/28/2023.  Patient presents today for procedure.  He was under the understanding that he was getting a epidural injection that he did last time.  He previously had a T11-T12 ESI on 06/28/2023 that provided him with 50% pain relief for approximately 6 months.  Of note, the patient recently had a cardioversion for atrial fibrillation.  This was done 02/09/2024.  He is currently on Eliquis .  His last dose was this morning.  I instructed him that he would need to stop his Eliquis  for 3 days prior to his scheduled procedure.  He would need to get clearance from his cardiologist given his recent cardioversion.  He states that he has an appointment with cardiology next  Wednesday.  Will plan for epidural steroid injection the week after assuming cardiology supports patient holding his Eliquis  for 3 days prior to his epidural.  I told him that he can take 81 aspirin instead.  ROS  Constitutional: Denies any fever or chills Gastrointestinal: No reported hemesis, hematochezia, vomiting, or acute GI distress Musculoskeletal: Mid and low back pain Neurological: No reported episodes of acute onset apraxia, aphasia, dysarthria, agnosia, amnesia, paralysis, loss of coordination, or loss of consciousness  Medication Review  Flaxseed (Linseed), Lactobacillus, Omega-3, acetaminophen , amiodarone , apixaban , metoprolol  tartrate, multivitamin with minerals, pantoprazole, rosuvastatin , tadalafil , vitamin C, and vitamin E  History Review  Allergy: Seth Stewart has no known allergies. Drug: Seth Stewart  reports no history of drug use. Alcohol:  reports current alcohol use of about 7.0 - 14.0 standard drinks of alcohol per week. Tobacco:  reports that he has never smoked. He has never used smokeless tobacco. Social: Seth Stewart  reports that he has never smoked. He has never used smokeless tobacco. He reports current alcohol use of about 7.0 - 14.0 standard drinks of alcohol per week. He reports that he does not use drugs. Medical:  has a past medical history of AF (atrial fibrillation) (HCC), Back pain, Dysphagia, Dysrhythmia, Hemorrhoids, Hyperlipidemia, and Hypertension. Surgical: Seth Stewart  has a past surgical history that includes Eye surgery (09/21/2009); Laminectomy (09/21/2002); Colonoscopy (12/06/2014); Ablation; Hernia repair (Right, 01/07/2015); Esophagogastroduodenoscopy (N/A, 10/29/2022); Back surgery; Esophagogastroduodenoscopy; Esophagogastroduodenoscopy (egd) with propofol  (N/A, 12/04/2022); and Cardioversion (N/A, 02/09/2024). Family: family history includes Cancer in his mother; Heart attack in his father and paternal uncle;  Heart disease in his father.  Laboratory  Chemistry Profile   Renal Lab Results  Component Value Date   BUN 11 01/19/2024   CREATININE 0.80 01/19/2024   BCR 14 01/19/2024   GFRAA >60 05/09/2019   GFRNONAA >60 05/09/2019    Hepatic Lab Results  Component Value Date   AST 21 01/03/2024   ALT 16 01/03/2024   ALBUMIN 4.6 01/03/2024   ALKPHOS 47 01/03/2024    Electrolytes Lab Results  Component Value Date   NA 135 01/19/2024   K 4.6 01/19/2024   CL 100 01/19/2024   CALCIUM  10.0 01/19/2024   PHOS 3.3 11/11/2015    Bone No results found for: "VD25OH", "VD125OH2TOT", "VH8469GE9", "BM8413KG4", "25OHVITD1", "25OHVITD2", "25OHVITD3", "TESTOFREE", "TESTOSTERONE"  Inflammation (CRP: Acute Phase) (ESR: Chronic Phase) No results found for: "CRP", "ESRSEDRATE", "LATICACIDVEN"       Note: Above Lab results reviewed.  Recent Imaging Review  ECHOCARDIOGRAM COMPLETE    ECHOCARDIOGRAM REPORT       Patient Name:   [name] Date of Exam: 02/01/2024 Medical Rec #:  [id number]                     Height:       75.0 in Accession #:    [id number]                    Weight:       206.7 lb Date of Birth:  [birth year]                     BSA:          2.225 m Patient Age:    83 years                      BP:           130/80 mmHg Patient Gender: M                             HR:           79 bpm. Exam Location:  [facility]  Procedure: 2D Echo, Cardiac Doppler and Color Doppler (Both Spectral and Color            Flow Doppler were utilized during procedure).  Indications:    I48.0 Paroxysmal atrial fibrillation; I25.110 Atherosclerotic                 heart disease of native coronary artery with unstable angina                 pectoris   History:        Patient has no prior history of Echocardiogram examinations.                 Arrythmias:Atrial Fibrillation; Signs/Symptoms:Chest Pain.   Sonographer:    Alicia Inoue Referring Phys: [id number] [facility] [place]  IMPRESSIONS   1. Left ventricular ejection  fraction, by estimation, is 55 to 60%. Left ventricular ejection fraction by 2D MOD biplane is 57.4 %. The left ventricle has normal function. The left ventricle has no regional wall motion abnormalities. Left ventricular  diastolic parameters are indeterminate. The average left ventricular global longitudinal strain is -11.7 %. The global longitudinal strain is abnormal.  2. Right ventricular systolic function is normal. The right ventricular size is normal. There is mildly elevated pulmonary artery systolic pressure. The estimated  right ventricular systolic pressure is 39.3 mmHg.  3. Left atrial size was moderately dilated.  4. The mitral valve is normal in structure. Mild mitral valve regurgitation. No evidence of mitral stenosis.  5. The aortic valve is normal in structure. There is mild calcification of the aortic valve. Aortic valve regurgitation is mild. Aortic valve sclerosis is present, with no evidence of aortic valve stenosis.  6. The inferior vena cava is normal in size with greater than 50% respiratory variability, suggesting right atrial pressure of 3 mmHg.  FINDINGS  Left Ventricle: Left ventricular ejection fraction, by estimation, is 55 to 60%. Left ventricular ejection fraction by 2D MOD biplane is 57.4 %. The left ventricle has normal function. The left ventricle has no regional wall motion abnormalities. The  average left ventricular global longitudinal strain is -11.7 %. Strain was performed and the global longitudinal strain is abnormal. The left ventricular internal cavity size was normal in size. There is no left ventricular hypertrophy. Left ventricular  diastolic parameters are indeterminate.  Right Ventricle: The right ventricular size is normal. No increase in right ventricular wall thickness. Right ventricular systolic function is normal. There is mildly elevated pulmonary artery systolic pressure. The tricuspid regurgitant velocity is 2.93  m/s, and with an assumed right  atrial pressure of 5 mmHg, the estimated right ventricular systolic pressure is 39.3 mmHg.  Left Atrium: Left atrial size was moderately dilated.  Right Atrium: Right atrial size was normal in size.  Pericardium: There is no evidence of pericardial effusion.  Mitral Valve: The mitral valve is normal in structure. Mild mitral valve regurgitation. No evidence of mitral valve stenosis.  Tricuspid Valve: The tricuspid valve is normal in structure. Tricuspid valve regurgitation is mild . No evidence of tricuspid stenosis.  Aortic Valve: The aortic valve is normal in structure. There is mild calcification of the aortic valve. Aortic valve regurgitation is mild. Aortic valve sclerosis is present, with no evidence of aortic valve stenosis. Aortic valve mean gradient measures  4.0 mmHg. Aortic valve peak gradient measures 6.6 mmHg. Aortic valve area, by VTI measures 2.30 cm.  Pulmonic Valve: The pulmonic valve was normal in structure. Pulmonic valve regurgitation is not visualized. No evidence of pulmonic stenosis.  Aorta: The aortic root is normal in size and structure.  Venous: The inferior vena cava is normal in size with greater than 50% respiratory variability, suggesting right atrial pressure of 3 mmHg.  IAS/Shunts: No atrial level shunt detected by color flow Doppler.  Additional Comments: 3D was performed not requiring image post processing on an independent workstation and was indeterminate.    LEFT VENTRICLE PLAX 2D                        Biplane EF (MOD) LVIDd:         5.25 cm         LV Biplane EF:   Left LVIDs:         4.00 cm                          ventricular LV PW:         1.19 cm                          ejection LV IVS:        1.11 cm  fraction by LVOT diam:     2.40 cm                          2D MOD LV SV:         67                               biplane is LV SV Index:   30                               57.4 %. LVOT Area:     4.52 cm                                   2D Longitudinal LV Volumes (MOD)               Strain LV vol d, MOD    97.6 ml       2D Strain GLS   -11.7 % A2C:                           Avg: LV vol d, MOD    121.0 ml A4C: LV vol s, MOD    40.6 ml A2C: LV vol s, MOD    49.5 ml A4C: LV SV MOD A2C:   57.0 ml LV SV MOD A4C:   121.0 ml LV SV MOD BP:    62.7 ml  RIGHT VENTRICLE             IVC RV Basal diam:  3.50 cm     IVC diam: 2.18 cm RV Mid diam:    2.82 cm RV S prime:     13.60 cm/s TAPSE (M-mode): 1.9 cm  LEFT ATRIUM            Index        RIGHT ATRIUM           Index LA Vol (A2C): 122.0 ml 54.82 ml/m  RA Area:     33.10 cm LA Vol (A4C): 94.9 ml  42.65 ml/m  RA Volume:   119.00 ml 53.48 ml/m  AORTIC VALVE AV Area (Vmax):    2.91 cm AV Area (Vmean):   2.81 cm AV Area (VTI):     2.30 cm AV Vmax:           128.50 cm/s AV Vmean:          89.800 cm/s AV VTI:            0.292 m AV Peak Grad:      6.6 mmHg AV Mean Grad:      4.0 mmHg LVOT Vmax:         82.70 cm/s LVOT Vmean:        55.800 cm/s LVOT VTI:          0.148 m LVOT/AV VTI ratio: 0.51   AORTA Ao Sinus diam: 3.30 cm Ao STJ diam:   3.0 cm Ao Asc diam:   3.50 cm  MV E velocity: 73.40 cm/s  TRICUSPID VALVE                            TV Peak grad:   19.5 mmHg  TV Vmax:        2.21 m/s                            TR Peak grad:   34.3 mmHg                            TR Vmax:        293.00 cm/s                              SHUNTS                            Systemic VTI:  0.15 m                            Systemic Diam: 2.40 cm  Belva Boyden MD Electronically signed by Belva Boyden MD Signature Date/Time: 02/02/2024/1:25:38 PM      Final    EXAM:  Unenhanced Lumbar MRI  INDICATION:  Low back pain, bilateral sciatica.  TECHNIQUE: Axial and sagittal images of the lumbar spine were obtained using multiple pulse sequences.  CONTRAST DOSE:   None.  COMPARISON: MRI from October 03, 2014 exam  plain films from October 16, 2019.  FINDINGS:    Segmentation: Normal  Conus medullaris: The tip of the conus ends at L1-2 Vertebral Column: There is scoliosis convex left. Patient has undergone previous multilevel lumbar laminectomies.. There is grade 1 anterolisthesis at L4-5 of approximately 1.0 cm. There is also approximately 3 mm retrolisthesis L2 on L3  At T11-12, . There is mild disc bulge and spondylosis without central or foraminal stenosis  At T12-L1, there has been interval progression of degree of disc degeneration. There is diffuse disc bulge and spondylosis with a superimposed large disc herniation which has developed since the previous study. It is at the disc level and extends inferior to and even more superior to the disc level. Portion of this herniated disc lies posterior to the inferior one third of T12 and posterior to the proximal 1/ 6 of L1. There is resulting moderate to severe central canal stenosis with compression of the conus. There is bilateral lateral recess stenosis. There is moderate to severe bilateral foraminal stenosis.  L1-L2: The disc is degenerated. There is diffuse disc bulge and spondylosis. There are facet degenerative changes and hypertrophy and ligamentum flavum hypertrophy. There has been previous laminectomy at this level. There is distortion of the thecal sac with more compression posterior laterally from the right facet at the left. There is improvement in degree of central canal stenosis as the previous study however there is still mild to moderate central canal stenosis. There is bilateral lateral recess stenosis and moderate right and moderate to severe left foraminal stenosis  L2-L3: The disc is degenerated. There is diffuse disc bulge and spondylosis. Period there is superimposed small posterior right disc herniation extending into the right lateral recess. There are facet degenerative changes and hypertrophy and ligamentum  flavum hypertrophy. Patient has undergone decompression laminectomy. There has been improvement in degree of central canal stenosis however there is still moderate central canal stenosis present. There is bilateral lateral recess stenosis, right greater than left. There is severe right and moderate to severe left foraminal stenosis  L3-4: The disc is  degenerated. There is diffuse disc bulge and spondylosis. There are facet degenerative changes and hypertrophy. Patient has undergone laminectomy at this level. There is no longer significant central canal stenosis at this level. There is stenosis of the superior aspect of both lateral recesses. There is severe right and mild to moderate left foraminal stenosis.  At L4-5, there is grade 1 anterolisthesis. The disc is calcified as noted on plain film. There is diffuse disc bulge and spondylosis. Patient has undergone decompression laminectomy at this level. There are facet degenerative changes and hypertrophy and ligamentum flavum hypertrophy. There is persistent moderate to severe central canal stenosis. There is persistent bilateral lateral recess stenosis and persistent moderate to severe right and mild to moderate left foraminal stenosis  L5-S1: The disc is degenerated. Patient is undergone decompression laminectomy. There are facet degenerative changes and hypertrophy. There is no significant central canal stenosis. There is mild to moderate right and mild left foraminal stenosis. There is also stenosis of the superior aspect of both lateral recesses.  IMPRESSION: 1. Scoliosis 2. Status post decompression laminectomies at multiple levels with some improvement in degree of central canal stenosis at most levels with exception of L4-5 where there is persistent moderate to severe central canal stenosis and there is still moderate central canal stenosis at L2-3 but this represents an improvement compared with prior 3. Multilevel lateral  recess and foraminal stenosis as outlined above. 4. Interval development of increased degenerative disc disease at T12-L1 with superimposed large disc herniation as described. This has developed since the previous study and results in moderate to severe central canal stenosis and compression of the conus. There is also bilateral lateral recess and bilateral foraminal stenosis at this level  5. Please see individual levels above for more complete description  Electronically Signed by:  Alfonse Iha, MD, [facility] Electronically Signed on:  10/16/2020 1:39 PM Procedure Note    Note: Reviewed         Physical Exam  General appearance: Well nourished, well developed, and well hydrated. In no apparent acute distress Mental status: Alert, oriented x 3 (person, place, & time)       Respiratory: No evidence of acute respiratory distress Eyes: PERLA Vitals: There were no vitals taken for this visit. BMI: Estimated body mass index is 25.84 kg/m as calculated from the following:   Height as of 01/25/24: 6\' 3"  (1.905 m).   Weight as of 01/25/24: 206 lb 11.2 oz (93.8 kg). Ideal: Patient weight not recorded  Thoracic and lumbar spine pain, radicular  5 out of 5 strength bilateral lower extremity: Plantar flexion, dorsiflexion, knee flexion, knee extension.   Assessment   Diagnosis Status  1. Chronic radicular lumbar pain   2. Radicular pain of thoracic region   3. Failed back surgical syndrome   4. Chronic pain syndrome    Having a Flare-up Having a Flare-up Having a Flare-up   Updated Problems: No problems updated.  Plan of Care  1. Chronic radicular lumbar pain (Primary) - Lumbar Epidural Injection  2. Radicular pain of thoracic region - Lumbar Epidural Injection - Thoracic Epidural Injection; Future  3. Failed back surgical syndrome  4. Chronic pain syndrome  Increased thoracolumbar radicular pain has a history of failed back surgical syndrome and lumbar  decompression.  Previous T11-T12 ESI 06/28/2023 that provided him with greater than 50% pain relief for approximately 6 months.  Given return of pain, we discussed repeating.  He will need to stop his Eliquis  3 days prior to  his procedure.  Will need to get cardiac clearance for that.  Orders:  Orders Placed This Encounter  Procedures   Thoracic Epidural Injection    Standing Status:   Future    Expected Date:   03/06/2024    Expiration Date:   05/23/2024    Scheduling Instructions:     Level: T12.L1     Sedation: without     Timeframe: ASAA    Where will this procedure be performed?:   [facility]     T11/T12 ESI (hx of L1-L5 decompression): 03/22/2023, 06/28/2023    Return in about 2 weeks (around 03/06/2024) for T12/L1 ESI (stop Eliquis  3 days prior- get clearance), in clinic [facility].    Recent Visits No visits were found meeting these conditions. Showing recent visits within past 90 days and meeting all other requirements Today's Visits Date Type Provider Dept  02/21/24 Procedure visit Seth Collin, MD [facility]  Showing today's visits and meeting all other requirements Future Appointments Date Type Provider Dept  03/06/24 Appointment Seth Collin, MD [facility]  Showing future appointments within next 90 days and meeting all other requirements  I discussed the assessment and treatment plan with the patient. The patient was provided an opportunity to ask questions and all were answered. The patient agreed with the plan and demonstrated an understanding of the instructions.  Patient advised to call back or seek an in-person evaluation if the symptoms or condition worsens.  Duration of encounter: .  Total time on encounter, as per AMA guidelines included both the face-to-face and non-face-to-face time personally spent by the physician and/or other qualified health care professional(s) on the day of the encounter (includes time in activities that  require the physician or other qualified health care professional and does not include time in activities normally performed by clinical staff). Physician's time may include the following activities when performed: Preparing to see the patient (e.g., pre-charting review of records, searching for previously ordered imaging, lab work, and nerve conduction tests) Review of prior analgesic pharmacotherapies. Reviewing PMP Interpreting ordered tests (e.g., lab work, imaging, nerve conduction tests) Performing post-procedure evaluations, including interpretation of diagnostic procedures Obtaining and/or reviewing separately obtained history Performing a medically appropriate examination and/or evaluation Counseling and educating the patient/family/caregiver Ordering medications, tests, or procedures Referring and communicating with other health care professionals (when not separately reported) Documenting clinical information in the electronic or other health record Independently interpreting results (not separately reported) and communicating results to the patient/ family/caregiver Care coordination (not separately reported)  Note by: Seth Collin, MD (TTS and AI technology used. I apologize for any typographical errors that were not detected and corrected.) Date: 02/21/2024; Time: 12:07 PM

## 2024-02-21 NOTE — Telephone Encounter (Signed)
 Pt called me back and stated that he needs a back injection and will need to hold his Eliquis  x 3 days. His injection is scheduled for 6/16 and his Ablation is scheduled for 7/14.   He would like to know if it's ok for him to hold his Eliquis  for those 3 days and still have his Ablation.

## 2024-02-21 NOTE — Telephone Encounter (Signed)
 LM for pt to call back. Per MyChart he has a procedure scheduled on 6/16 that states (hold Eliquis  x 3 days)   Pt is scheduled for an Afib Ablation on 7/14 and can not hold his Eliquis  prior.

## 2024-02-22 ENCOUNTER — Ambulatory Visit: Admitting: Student in an Organized Health Care Education/Training Program

## 2024-02-25 ENCOUNTER — Ambulatory Visit: Payer: Self-pay

## 2024-02-29 NOTE — Progress Notes (Unsigned)
 Electrophysiology Office Follow up Visit Note:    Date:  03/01/2024   ID:  Seth Goes Sr., DOB [birth year], MRN [id number]  PCP:  Clarise Crooks, MD (Inactive)  [facility] Cardiologist:  Antionette Kirks, MD  [facility] Electrophysiologist:  Boyce Byes, MD    Interval History:     Seth Fusselman Sr. is a 83 y.o. male who presents for a follow up visit.   I last saw him 01/19/2024. At the last appointment I recommended DCCV. This was perfomred 02/09/2024. He is currently scheduled for redo PVI on 04/03/2024.   He is doing well today.  He he thinks he has maintained sinus rhythm.  His heart rates are slightly lower, now in the 40s.  He is having back injection on Monday.      Past medical, surgical, social and family history were reviewed.  ROS:   Please see the history of present illness.    All other systems reviewed and are negative.  EKGs/Labs/Other Studies Reviewed:    The following studies were reviewed today:     EKG Interpretation Date/Time:  Wednesday March 01 2024 13:59:18 EDT Ventricular Rate:  51 PR Interval:  270 QRS Duration:  104 QT Interval:  488 QTC Calculation: 449 R Axis:   30  Text Interpretation: Sinus bradycardia with 1st degree A-V block with occasional Premature ventricular complexes Confirmed by Harvie Liner [phone]) on 03/01/2024 2:06:03 PM    Physical Exam:    VS:  BP (!) 140/60 (BP Location: Left Arm)   Pulse (!) 49   Ht 6' 3 (1.905 m)   Wt 204 lb (92.5 kg)   SpO2 97%   BMI 25.50 kg/m     Wt Readings from Last 3 Encounters:  03/01/24 204 lb (92.5 kg)  01/25/24 206 lb 11.2 oz (93.8 kg)  01/19/24 206 lb (93.4 kg)     GEN: no distress CARD: RRR, No MRG RESP: No IWOB. CTAB.      ASSESSMENT:    1. Persistent atrial fibrillation (HCC)   2. Atrial flutter, unspecified type (HCC)    PLAN:    In order of problems listed above:  #Persistent atrial fibrillation and flutter S/p ablation of  flutter years ago at [facility]. Recently minireload with Amio and DCCV on 02/09/2024. PVI planned 04/03/2024. Continue OAC.  Discussed treatment options today for AF including antiarrhythmic drug therapy and ablation. Discussed risks, recovery and likelihood of success with each treatment strategy. Risk, benefits, and alternatives to EP study and ablation for afib were discussed. These risks include but are not limited to stroke, bleeding, vascular damage, tamponade, perforation, damage to the esophagus, lungs, phrenic nerve and other structures, pulmonary vein stenosis, worsening renal function, coronary vasospasm and death.  Discussed potential need for repeat ablation procedures and antiarrhythmic drugs after an initial ablation. The patient understands these risk and wishes to proceed.  We will therefore proceed with catheter ablation at the next available time.  Carto, ICE, anesthesia are requested for the procedure.  Will also obtain CT PV protocol prior to the procedure to exclude LAA thrombus and further evaluate atrial anatomy.  #Anticoagulation management The patient has a back injection scheduled for Monday.  The pain doctor has requested a 3-day hold of Eliquis .  I think that is reasonable.  He should restart the anticoagulant when his orthopedic physician thinks it is appropriate/safe.  He is at a mild increased stroke risk during the hold of his anticoagulant.  He understands this.  He  is considering not going through the ablation.  He will let us  know if should he wish to cancel.  Signed, Harvie Liner, MD, [facility], [facility] 03/01/2024 2:06 PM    Electrophysiology [facility]

## 2024-03-01 ENCOUNTER — Ambulatory Visit: Admitting: Cardiology

## 2024-03-01 ENCOUNTER — Ambulatory Visit: Attending: Cardiology | Admitting: Cardiology

## 2024-03-01 VITALS — BP 140/60 | HR 49 | Ht 75.0 in | Wt 204.0 lb

## 2024-03-01 DIAGNOSIS — D0439 Carcinoma in situ of skin of other parts of face: Secondary | ICD-10-CM | POA: Diagnosis not present

## 2024-03-01 DIAGNOSIS — I4892 Unspecified atrial flutter: Secondary | ICD-10-CM | POA: Insufficient documentation

## 2024-03-01 DIAGNOSIS — I4819 Other persistent atrial fibrillation: Secondary | ICD-10-CM | POA: Insufficient documentation

## 2024-03-01 MED ORDER — METOPROLOL TARTRATE 25 MG PO TABS: 12.5000 mg | ORAL_TABLET | Freq: Two times a day (BID) | ORAL | 3 refills | Status: DC

## 2024-03-01 NOTE — Patient Instructions (Addendum)
 Medication Instructions:  Your physician has recommended you make the following change in your medication:  1) DECREASE metoprolol  to 12.5 mg daily  *If you need a refill on your cardiac medications before your next appointment, please call your pharmacy*   Testing/Procedures: Ablation Your physician has recommended that you have an ablation. Catheter ablation is a medical procedure used to treat some cardiac arrhythmias (irregular heartbeats). During catheter ablation, a long, thin, flexible tube is put into a blood vessel in your groin (upper thigh), or neck. This tube is called an ablation catheter. It is then guided to your heart through the blood vessel. Radio frequency waves destroy small areas of heart tissue where abnormal heartbeats may cause an arrhythmia to start.   Please call us  when you decide if you would like to proceed with scheduled ablation on 7/14.  Follow-Up: At [facility], you and your health needs are our priority.  As part of our continuing mission to provide you with exceptional heart care, our providers are all part of one team.  This team includes your primary Cardiologist (physician) and Advanced Practice Providers or APPs (Physician Assistants and Nurse Practitioners) who all work together to provide you with the care you need, when you need it.

## 2024-03-02 ENCOUNTER — Encounter: Payer: Self-pay | Admitting: Cardiology

## 2024-03-06 ENCOUNTER — Encounter: Payer: Self-pay | Admitting: Student in an Organized Health Care Education/Training Program

## 2024-03-06 ENCOUNTER — Ambulatory Visit (HOSPITAL_BASED_OUTPATIENT_CLINIC_OR_DEPARTMENT_OTHER): Admitting: Student in an Organized Health Care Education/Training Program

## 2024-03-06 ENCOUNTER — Ambulatory Visit
Admission: RE | Admit: 2024-03-06 | Discharge: 2024-03-06 | Disposition: A | Discharge status: Home / Self Care | Source: Ambulatory Visit | Attending: Student in an Organized Health Care Education/Training Program | Admitting: Student in an Organized Health Care Education/Training Program

## 2024-03-06 VITALS — BP 168/92 | HR 63 | Temp 98.0°F | Resp 18 | Ht 75.0 in | Wt 200.0 lb

## 2024-03-06 DIAGNOSIS — G894 Chronic pain syndrome: Secondary | ICD-10-CM | POA: Diagnosis not present

## 2024-03-06 DIAGNOSIS — G8929 Other chronic pain: Secondary | ICD-10-CM | POA: Diagnosis not present

## 2024-03-06 DIAGNOSIS — M5414 Radiculopathy, thoracic region: Secondary | ICD-10-CM

## 2024-03-06 DIAGNOSIS — M5416 Radiculopathy, lumbar region: Secondary | ICD-10-CM | POA: Insufficient documentation

## 2024-03-06 MED ORDER — LIDOCAINE HCL 2 % IJ SOLN
20.0000 mL | Freq: Once | INTRAMUSCULAR | Status: AC
  Administered 2024-03-06: 100 mg

## 2024-03-06 MED ORDER — ROPIVACAINE HCL 2 MG/ML IJ SOLN
2.0000 mL | Freq: Once | INTRAMUSCULAR | Status: AC
  Administered 2024-03-06: 2 mL via EPIDURAL

## 2024-03-06 MED ORDER — IOHEXOL 180 MG/ML  SOLN
INTRAMUSCULAR | Status: AC
  Filled 2024-03-06: qty 20

## 2024-03-06 MED ORDER — DEXAMETHASONE SODIUM PHOSPHATE 10 MG/ML IJ SOLN
INTRAMUSCULAR | Status: AC
  Filled 2024-03-06: qty 1

## 2024-03-06 MED ORDER — IOHEXOL 180 MG/ML  SOLN
10.0000 mL | Freq: Once | INTRAMUSCULAR | Status: AC
  Administered 2024-03-06: 10 mL via EPIDURAL

## 2024-03-06 MED ORDER — SODIUM CHLORIDE 0.9% FLUSH: 2.0000 mL | Freq: Once | INTRAVENOUS | Status: AC

## 2024-03-06 MED ORDER — DEXAMETHASONE SODIUM PHOSPHATE 10 MG/ML IJ SOLN
10.0000 mg | Freq: Once | INTRAMUSCULAR | Status: AC
  Administered 2024-03-06: 10 mg

## 2024-03-06 MED ORDER — LIDOCAINE HCL (PF) 1 % IJ SOLN
INTRAMUSCULAR | Status: AC
  Filled 2024-03-06: qty 10

## 2024-03-06 MED ORDER — SODIUM CHLORIDE (PF) 0.9 % IJ SOLN
INTRAMUSCULAR | Status: AC
  Filled 2024-03-06: qty 10

## 2024-03-06 MED ORDER — ROPIVACAINE HCL 2 MG/ML IJ SOLN
INTRAMUSCULAR | Status: AC
  Filled 2024-03-06: qty 20

## 2024-03-06 NOTE — Progress Notes (Signed)
 PROVIDER NOTE: Interpretation of information contained herein should be left to medically-trained personnel. Specific patient instructions are provided elsewhere under Patient Instructions section of medical record. This document was created in part using STT-dictation technology, any transcriptional errors that may result from this process are unintentional.  Patient: Seth Strange Sr. Type: Established DOB: [birth year] MRN: [id number] PCP: Clarise Crooks, MD (Inactive)  Service: Procedure DOS: 03/06/2024 Setting: Ambulatory Location: Ambulatory outpatient facility Delivery: Face-to-face Provider: Cephus Collin, MD Specialty: Interventional Pain Management Specialty designation: 09 Location: Outpatient facility Ref. Prov.: Clarise Crooks, MD       Interventional Therapy   Primary Reason for Visit: Interventional Pain Management Treatment. CC: Back Pain (Lumbar worse on the left )  Procedure:           Inter-Laminar Thoracic Epidural Steroid Block/Injection  #1  Laterality:  Left Level: T11-12  Imaging: Fluoroscopic guidance Anesthesia: Local anesthesia (1-2% Lidocaine ) Anxiolysis: None Sedation: None.  DOS: 03/06/2024 Performed by: Cephus Collin, MD  Purpose: Diagnostic/Therapeutic Indications: Thoracic back pain, radicular pain, with degenerative disc disease severe enough to impact quality of life or function. 1. Chronic radicular lumbar pain   2. Radicular pain of thoracic region   3. Chronic pain syndrome     NAS-11 Pain score:   Pre-procedure: 3 /10   Post-procedure: 3 /10     Position / Prep / Materials:  Position: Prone  Prep solution: DuraPrep (Iodine Povacrylex [0.7% available iodine] and Isopropyl Alcohol, 74% w/w) Prep Area: Posterior Thoracolumbar (Upper back from shoulders to lower lumbar region).  Materials:  Tray: Epidural Needle(s) Type: Epidural needle Gauge (G): 22 Length: Regular (3.5-in) Qty: 1  H&P (Pre-op Assessment):  Sr. Burby is  a 83 y.o. (year old), male patient, seen today for interventional treatment. He  has a past surgical history that includes Eye surgery (09/21/2009); Laminectomy (09/21/2002); Colonoscopy (12/06/2014); Ablation; Hernia repair (Right, 01/07/2015); Esophagogastroduodenoscopy (N/A, 10/29/2022); Back surgery; Esophagogastroduodenoscopy; Esophagogastroduodenoscopy (egd) with propofol  (N/A, 12/04/2022); and Cardioversion (N/A, 02/09/2024). Sr. Every has a current medication list which includes the following prescription(s): acetaminophen , amiodarone , apixaban , vitamin c, flaxseed (linseed), lactobacillus, metoprolol  tartrate, multivitamin with minerals, omega-3, pantoprazole, rosuvastatin , tadalafil , and vitamin e. His primarily concern today is the Back Pain (Lumbar worse on the left )  Initial Vital Signs:  Pulse/HCG Rate: 63ECG Heart Rate: 60 Temp: 98 F (36.7 C) Resp: 16 BP: (!) 143/61 SpO2: 98 %  BMI: Estimated body mass index is 25 kg/m as calculated from the following:   Height as of this encounter: 6' 3 (1.905 m).   Weight as of this encounter: 200 lb (90.7 kg).  Risk Assessment: Allergies: Reviewed. He has no known allergies.  Allergy Precautions: None required Coagulopathies: Reviewed. None identified.  Blood-thinner therapy: None at this time Active Infection(s): Reviewed. None identified. Mr. Bracken is afebrile  Site Confirmation: Sr. Manthey was asked to confirm the procedure and laterality before marking the site Procedure checklist: Completed Consent: Before the procedure and under the influence of no sedative(s), amnesic(s), or anxiolytics, the patient was informed of the treatment options, risks and possible complications. To fulfill our ethical and legal obligations, as recommended by the American Medical Association's Code of Ethics, I have informed the patient of my clinical impression; the nature and purpose of the treatment or procedure; the risks, benefits, and possible  complications of the intervention; the alternatives, including doing nothing; the risk(s) and benefit(s) of the alternative treatment(s) or procedure(s); and the risk(s) and benefit(s) of doing nothing. The patient was provided  information about the general risks and possible complications associated with the procedure. These may include, but are not limited to: failure to achieve desired goals, infection, bleeding, organ or nerve damage, allergic reactions, paralysis, and death. In addition, the patient was informed of those risks and complications associated to Spine-related procedures, such as failure to decrease pain; infection (i.e.: Meningitis, epidural or intraspinal abscess); bleeding (i.e.: epidural hematoma, subarachnoid hemorrhage, or any other type of intraspinal or peri-dural bleeding); organ or nerve damage (i.e.: Any type of peripheral nerve, nerve root, or spinal cord injury) with subsequent damage to sensory, motor, and/or autonomic systems, resulting in permanent pain, numbness, and/or weakness of one or several areas of the body; allergic reactions; (i.e.: anaphylactic reaction); and/or death. Furthermore, the patient was informed of those risks and complications associated with the medications. These include, but are not limited to: allergic reactions (i.e.: anaphylactic or anaphylactoid reaction(s)); adrenal axis suppression; blood sugar elevation that in diabetics may result in ketoacidosis or comma; water retention that in patients with history of congestive heart failure may result in shortness of breath, pulmonary edema, and decompensation with resultant heart failure; weight gain; swelling or edema; medication-induced neural toxicity; particulate matter embolism and blood vessel occlusion with resultant organ, and/or nervous system infarction; and/or aseptic necrosis of one or more joints. Finally, the patient was informed that Medicine is not an exact science; therefore, there is also  the possibility of unforeseen or unpredictable risks and/or possible complications that may result in a catastrophic outcome. The patient indicated having understood very clearly. We have given the patient no guarantees and we have made no promises. Enough time was given to the patient to ask questions, all of which were answered to the patient's satisfaction. Sr. Atchley has indicated that he wanted to continue with the procedure. Attestation: I, the ordering provider, attest that I have discussed with the patient the benefits, risks, side-effects, alternatives, likelihood of achieving goals, and potential problems during recovery for the procedure that I have provided informed consent. Date  Time: 03/06/2024  1:45 PM   Pre-Procedure Preparation:  Monitoring: As per clinic protocol. Respiration, ETCO2, SpO2, BP, heart rate and rhythm monitor placed and checked for adequate function Safety Precautions: Patient was assessed for positional comfort and pressure points before starting the procedure. Time-out: I initiated and conducted the Time-out before starting the procedure, as per protocol. The patient was asked to participate by confirming the accuracy of the Time Out information. Verification of the correct person, site, and procedure were performed and confirmed by me, the nursing staff, and the patient. Time-out conducted as per Joint Commission's Universal Protocol (UP.01.01.01). Time: 1429 Start Time: 1429 hrs.  Description of Procedure:          Target Area: For Epidural Steroid injection(s), the target area is the  interlaminar space, initially targeting the lower border of the superior vertebral body lamina. Approach: Interlaminar approach. Area Prepped: Entire Posterior Thoracolumbar Region DuraPrep (Iodine Povacrylex [0.7% available iodine] and Isopropyl Alcohol, 74% w/w) Safety Precautions: Aspiration looking for blood return was conducted prior to all injections. At no point did we  inject any substances, as a needle was being advanced. No attempts were made at seeking any paresthesias. Safe injection practices and needle disposal techniques used. Medications properly checked for expiration dates. SDV (single dose vial) medications used. Description of the Procedure: Protocol guidelines were followed. The patient was placed in position over the fluoroscopy table. The target area was identified and the area prepped in the usual  manner. Skin & deeper tissues infiltrated with local anesthetic. Appropriate amount of time allowed to pass for local anesthetics to take effect. The procedure needles were then advanced to the target area. The inferior aspect of the superior lamina was contacted and the needle walked caudad, until the lamina was cleared. The epidural space was identified using loss-of-resistance technique with 0.9% PF-NSS (2-3mL), in a low friction 10cc LOR glass syringe. Proper needle placement was secured. Negative aspiration confirmed. Solution injected in intermittent fashion, asking for systemic symptoms every 0.5 cc of injectate. The needles were then removed and the area cleansed, making sure to leave some of the prepping solution behind to take advantage of its long term bactericidal properties. Vitals:   03/06/24 1354 03/06/24 1423 03/06/24 1428 03/06/24 1433  BP: (!) 143/61 (!) 156/75 (!) 165/86 (!) 168/92  Pulse: 63     Resp: 16 18 13 18   Temp: 98 F (36.7 C)     TempSrc: Temporal     SpO2: 98% 99% 99% 98%  Weight: 200 lb (90.7 kg)     Height: 6' 3 (1.905 m)       Start Time: 1429 hrs. End Time:   hrs. Imaging Guidance (Spinal):          Type of Imaging Technique: Fluoroscopy Guidance (Spinal) Indication(s): Assistance in needle guidance and placement for procedures requiring needle placement in or near specific anatomical locations not easily accessible without such assistance. Exposure Time: Please see nurses notes. Contrast: Before injecting any  contrast, we confirmed that the patient did not have an allergy to iodine, shellfish, or radiological contrast. Once satisfactory needle placement was completed at the desired level, radiological contrast was injected. Contrast injected under live fluoroscopy. No contrast complications. See chart for type and volume of contrast used. Fluoroscopic Guidance: I was personally present during the use of fluoroscopy. Tunnel Vision Technique used to obtain the best possible view of the target area. Parallax error corrected before commencing the procedure. Direction-depth-direction technique used to introduce the needle under continuous pulsed fluoroscopy. Once target was reached, antero-posterior, oblique, and lateral fluoroscopic projection used confirm needle placement in all planes. Images permanently stored in EMR. Interpretation: I personally interpreted the imaging intraoperatively. Adequate needle placement confirmed in multiple planes. Appropriate spread of contrast into desired area was observed. No evidence of afferent or efferent intravascular uptake. No intrathecal or subarachnoid spread observed. Permanent images saved into the patient's record.  Antibiotic Prophylaxis:   Anti-infectives (From admission, onward)    None      Indication(s): None identified  Post-operative Assessment:  Post-procedure Vital Signs:  Pulse/HCG Rate: 63(!) 58 Temp: 98 F (36.7 C) Resp: 18 BP: (!) 168/92 SpO2: 98 %  EBL: None  Complications: No immediate post-treatment complications observed by team, or reported by patient.  Note: The patient tolerated the entire procedure well. A repeat set of vitals were taken after the procedure and the patient was kept under observation following institutional policy, for this type of procedure. Post-procedural neurological assessment was performed, showing return to baseline, prior to discharge. The patient was provided with post-procedure discharge instructions,  including a section on how to identify potential problems. Should any problems arise concerning this procedure, the patient was given instructions to immediately contact us , at any time, without hesitation. In any case, we plan to contact the patient by telephone for a follow-up status report regarding this interventional procedure.  Comments:  No additional relevant information.  Plan of Care (POC)  Orders:  Orders Placed  This Encounter  Procedures   DG PAIN CLINIC C-ARM 1-60 MIN NO REPORT    Intraoperative interpretation by procedural physician at [facility].    Standing Status:   Standing    Number of Occurrences:   1    Reason for exam::   Assistance in needle guidance and placement for procedures requiring needle placement in or near specific anatomical locations not easily accessible without such assistance.     Medications ordered for procedure: Meds ordered this encounter  Medications   iohexol  (OMNIPAQUE ) 180 MG/ML injection 10 mL    Must be Myelogram-compatible. If not available, you may substitute with a water-soluble, non-ionic, hypoallergenic, myelogram-compatible radiological contrast medium.   lidocaine  (XYLOCAINE ) 2 % (with pres) injection 400 mg   ropivacaine  (PF) 2 mg/mL (0.2%) (NAROPIN ) injection 2 mL   sodium chloride  flush (NS) 0.9 % injection 2 mL   dexamethasone  (DECADRON ) injection 10 mg   Medications administered: We administered iohexol , lidocaine , ropivacaine  (PF) 2 mg/mL (0.2%), sodium chloride  flush, and dexamethasone .  See the medical record for exact dosing, route, and time of administration.  Follow-up plan:   Return in about 6 weeks (around 04/17/2024) for PPE, F2F.       T11/T12 ESI (hx of L1-L5 decompression) 06/2023    Recent Visits Date Type Provider Dept  02/21/24 Procedure visit Cephus Collin, MD [facility]  Showing recent visits within past 90 days and meeting all other requirements Today's Visits Date Type  Provider Dept  03/06/24 Procedure visit Cephus Collin, MD [facility]  Showing today's visits and meeting all other requirements Future Appointments Date Type Provider Dept  03/28/24 Appointment Cephus Collin, MD [facility]  Showing future appointments within next 90 days and meeting all other requirements  Disposition: Discharge home  Discharge (Date  Time): 03/06/2024;   hrs.   Primary Care Physician: Clarise Crooks, MD (Inactive) Location: [facility] Note by: Cephus Collin, MD (TTS technology used. I apologize for any typographical errors that were not detected and corrected.) Date: 03/06/2024; Time: 3:27 PM  Disclaimer:  Medicine is not an Visual merchandiser. The only guarantee in medicine is that nothing is guaranteed. It is important to note that the decision to proceed with this intervention was based on the information collected from the patient. The Data and conclusions were drawn from the patient's questionnaire, the interview, and the physical examination. Because the information was provided in large part by the patient, it cannot be guaranteed that it has not been purposely or unconsciously manipulated. Every effort has been made to obtain as much relevant data as possible for this evaluation. It is important to note that the conclusions that lead to this procedure are derived in large part from the available data. Always take into account that the treatment will also be dependent on availability of resources and existing treatment guidelines, considered by other Pain Management Practitioners as being common knowledge and practice, at the time of the intervention. For Medico-Legal purposes, it is also important to point out that variation in procedural techniques and pharmacological choices are the acceptable norm. The indications, contraindications, technique, and results of the above procedure should only be interpreted and judged by a  Board-Certified Interventional Pain Specialist with extensive familiarity and expertise in the same exact procedure and technique.

## 2024-03-06 NOTE — Patient Instructions (Signed)

## 2024-03-07 ENCOUNTER — Telehealth: Payer: Self-pay | Admitting: *Deleted

## 2024-03-07 NOTE — Telephone Encounter (Signed)
 Called for post procedure check. States doing well this AM. Denies HA or pain in his legs this morning. Pain has also decreased.

## 2024-03-08 ENCOUNTER — Ambulatory Visit: Admitting: Internal Medicine

## 2024-03-08 DIAGNOSIS — L97829 Non-pressure chronic ulcer of other part of left lower leg with unspecified severity: Secondary | ICD-10-CM | POA: Diagnosis not present

## 2024-03-08 DIAGNOSIS — D0472 Carcinoma in situ of skin of left lower limb, including hip: Secondary | ICD-10-CM | POA: Diagnosis not present

## 2024-03-10 ENCOUNTER — Ambulatory Visit
Admission: RE | Admit: 2024-03-10 | Discharge: 2024-03-10 | Disposition: A | Discharge status: Home / Self Care | Source: Ambulatory Visit | Attending: Cardiology | Admitting: Cardiology

## 2024-03-10 DIAGNOSIS — I4819 Other persistent atrial fibrillation: Secondary | ICD-10-CM | POA: Diagnosis not present

## 2024-03-10 MED ORDER — IOHEXOL 350 MG/ML SOLN
75.0000 mL | Freq: Once | INTRAVENOUS | Status: AC | PRN
  Administered 2024-03-10: 75 mL via INTRAVENOUS

## 2024-03-10 MED ORDER — SODIUM CHLORIDE 0.9 % IV SOLN: INTRAVENOUS | Status: DC

## 2024-03-13 ENCOUNTER — Ambulatory Visit (HOSPITAL_COMMUNITY)

## 2024-03-16 ENCOUNTER — Ambulatory Visit: Admitting: Internal Medicine

## 2024-03-23 ENCOUNTER — Telehealth (HOSPITAL_COMMUNITY): Payer: Self-pay

## 2024-03-23 DIAGNOSIS — Z01812 Encounter for preprocedural laboratory examination: Secondary | ICD-10-CM

## 2024-03-23 DIAGNOSIS — I4819 Other persistent atrial fibrillation: Secondary | ICD-10-CM

## 2024-03-23 NOTE — Telephone Encounter (Signed)
 Attempted to reach patient to discuss upcoming procedure, no answer. Left VM for patient to return call.  Lab orders placed/released for upcoming ablation.

## 2024-03-27 NOTE — Telephone Encounter (Signed)
 Attempted to reach patient to discuss upcoming procedure, no answer. Left VM for patient to return call.

## 2024-03-28 ENCOUNTER — Ambulatory Visit
Attending: Student in an Organized Health Care Education/Training Program | Admitting: Student in an Organized Health Care Education/Training Program

## 2024-03-28 ENCOUNTER — Encounter: Payer: Self-pay | Admitting: Student in an Organized Health Care Education/Training Program

## 2024-03-28 VITALS — BP 117/65 | HR 59 | Temp 97.5°F | Ht 75.0 in | Wt 200.0 lb

## 2024-03-28 DIAGNOSIS — M5416 Radiculopathy, lumbar region: Secondary | ICD-10-CM | POA: Diagnosis not present

## 2024-03-28 DIAGNOSIS — M5481 Occipital neuralgia: Secondary | ICD-10-CM | POA: Diagnosis not present

## 2024-03-28 DIAGNOSIS — G8929 Other chronic pain: Secondary | ICD-10-CM | POA: Insufficient documentation

## 2024-03-28 DIAGNOSIS — G894 Chronic pain syndrome: Secondary | ICD-10-CM | POA: Insufficient documentation

## 2024-03-28 DIAGNOSIS — G588 Other specified mononeuropathies: Secondary | ICD-10-CM | POA: Insufficient documentation

## 2024-03-28 DIAGNOSIS — M5414 Radiculopathy, thoracic region: Secondary | ICD-10-CM | POA: Insufficient documentation

## 2024-03-28 NOTE — Patient Instructions (Signed)
____________________________________________________________________________________________  General Risks and Possible Complications  Patient Responsibilities: It is important that you read this as it is part of your informed consent. It is our duty to inform you of the risks and possible complications associated with treatments offered to you. It is your responsibility as a patient to read this and to ask questions about anything that is not clear or that you believe was not covered in this document.  Patient's Rights: You have the right to refuse treatment. You also have the right to change your mind, even after initially having agreed to have the treatment done. However, under this last option, if you wait until the last second to change your mind, you may be charged for the materials used up to that point.  Introduction: Medicine is not an exact science. Everything in Medicine, including the lack of treatment(s), carries the potential for danger, harm, or loss (which is by definition: Risk). In Medicine, a complication is a secondary problem, condition, or disease that can aggravate an already existing one. All treatments carry the risk of possible complications. The fact that a side effects or complications occurs, does not imply that the treatment was conducted incorrectly. It must be clearly understood that these can happen even when everything is done following the highest safety standards.  No treatment: You can choose not to proceed with the proposed treatment alternative. The "PRO(s)" would include: avoiding the risk of complications associated with the therapy. The "CON(s)" would include: not getting any of the treatment benefits. These benefits fall under one of three categories: diagnostic; therapeutic; and/or palliative. Diagnostic benefits include: getting information which can ultimately lead to improvement of the disease or symptom(s). Therapeutic benefits are those associated with the  successful treatment of the disease. Finally, palliative benefits are those related to the decrease of the primary symptoms, without necessarily curing the condition (example: decreasing the pain from a flare-up of a chronic condition, such as incurable terminal cancer).  General Risks and Complications: These are associated to most interventional treatments. They can occur alone, or in combination. They fall under one of the following six (6) categories: no benefit or worsening of symptoms; bleeding; infection; nerve damage; allergic reactions; and/or death. No benefits or worsening of symptoms: In Medicine there are no guarantees, only probabilities. No healthcare provider can ever guarantee that a medical treatment will work, they can only state the probability that it may. Furthermore, there is always the possibility that the condition may worsen, either directly, or indirectly, as a consequence of the treatment. Bleeding: This is more common if the patient is taking a blood thinner, either prescription or over the counter (example: Goody Powders, Fish oil, Aspirin, Garlic, etc.), or if suffering a condition associated with impaired coagulation (example: Hemophilia, cirrhosis of the liver, low platelet counts, etc.). However, even if you do not have one on these, it can still happen. If you have any of these conditions, or take one of these drugs, make sure to notify your treating physician. Infection: This is more common in patients with a compromised immune system, either due to disease (example: diabetes, cancer, human immunodeficiency virus [HIV], etc.), or due to medications or treatments (example: therapies used to treat cancer and rheumatological diseases). However, even if you do not have one on these, it can still happen. If you have any of these conditions, or take one of these drugs, make sure to notify your treating physician. Nerve Damage: This is more common when the treatment is an invasive    one, but it can also happen with the use of medications, such as those used in the treatment of cancer. The damage can occur to small secondary nerves, or to large primary ones, such as those in the spinal cord and brain. This damage may be temporary or permanent and it may lead to impairments that can range from temporary numbness to permanent paralysis and/or brain death. Allergic Reactions: Any time a substance or material comes in contact with our body, there is the possibility of an allergic reaction. These can range from a mild skin rash (contact dermatitis) to a severe systemic reaction (anaphylactic reaction), which can result in death. Death: In general, any medical intervention can result in death, most of the time due to an unforeseen complication. ____________________________________________________________________________________________  

## 2024-03-28 NOTE — Progress Notes (Signed)
 PROVIDER NOTE: Interpretation of information contained herein should be left to medically-trained personnel. Specific patient instructions are provided elsewhere under Patient Instructions section of medical record. This document was created in part using AI and STT-dictation technology, any transcriptional errors that may result from this process are unintentional.  Patient: Seth GORMAN Holm Sr.  Service: E/M   PCP: Seth Cathryne BROCKS, MD (Inactive)  DOB: 10-16-1940  DOS: 03/28/2024  Provider: Wallie Sherry, MD  MRN: [id number]  Delivery: Face-to-face  Specialty: Interventional Pain Management  Type: Established Patient  Setting: Ambulatory outpatient facility  Specialty designation: 09  Referring Prov.: No ref. provider found  Location: Outpatient office facility       History of present illness (HPI) [name], a 83 y.o. year old male, is here today because of his Occipital neuralgia of left side [M54.81]. Seth Stewart primary complain today is Back Pain (Lower Back )  Pertinent problems: Seth Stewart has Lumbar canal stenosis; Cluneal neuropathy; History of lumbar laminectomy; Failed back surgical syndrome; Chronic radicular lumbar pain; and Chronic pain syndrome on their pertinent problem list.  Pain Assessment: Severity of Chronic pain is reported as a 5 /10. Location: Back Lower/Denies. Onset: More than a month ago. Quality: Dull, Discomfort. Timing: Constant. Modifying factor(s): Rest, procedure. Vitals:  height is 6' 3 (1.905 m) and weight is 200 lb (90.7 kg). His temperature is 97.5 F (36.4 C) (abnormal). His blood pressure is 117/65 and his pulse is 59 (abnormal). His oxygen saturation is 100%.  BMI: Estimated body mass index is 25 kg/m as calculated from the following:   Height as of this encounter: 6' 3 (1.905 m).   Weight as of this encounter: 200 lb (90.7 kg).  Last encounter: 08/02/2023. Last procedure: 03/06/2024.  Reason for encounter:  Post-Procedure Evaluation    Inter-Laminar Thoracic Epidural Steroid Block/Injection  #1  Laterality:  Left Level: T11-12  Imaging: Fluoroscopic guidance Anesthesia: Local anesthesia (1-2% Lidocaine ) Anxiolysis: None Sedation: None.  DOS: 03/06/2024 Performed by: Wallie Sherry, MD  Purpose: Diagnostic/Therapeutic Indications: Thoracic back pain, radicular pain, with degenerative disc disease severe enough to impact quality of life or function. 1. Chronic radicular lumbar pain   2. Radicular pain of thoracic region   3. Chronic pain syndrome     NAS-11 Pain score:   Pre-procedure: 3 /10   Post-procedure: 3 /10     Effectiveness:  Initial hour after procedure: 90 %  Subsequent 4-6 hours post-procedure: 50 %  Analgesia past initial 6 hours: 50 %  Ongoing improvement:  Analgesic:  50% Function: Seth Stewart reports improvement in function ROM: Seth Stewart reports improvement in ROM    History of Present Illness   Seth Stewart is an 83 year old male who presents with neck and low back pain.  He experiences neck pain that radiates from his neck up behind his ears, with an intensity reaching eight or nine out of ten. The pain is not constant and is related to certain neck positions or activities, wrapping over his scalp and off to the side. No constant pain, but pain intensity varies with neck positioning.  He also has pain overlying his iliac crest, which he associates with prior spine surgery. This pain is localized and has not been surgically addressed in the past.  He is currently taking Eliquis  for atrial fibrillation and is scheduled for a related procedure next week. He has not experienced pain during previous procedures and does not require sedation for upcoming interventions.  He recently returned from a trip and is planning another trip to [place] at the end of the month.        ROS  Constitutional: Denies any fever or chills Gastrointestinal: No reported hemesis, hematochezia,  vomiting, or acute GI distress Musculoskeletal: Denies any acute onset joint swelling, redness, loss of ROM, or weakness Neurological: Left occipital neuralgia  Medication Review  Flaxseed (Linseed), Lactobacillus, Omega-3, acetaminophen , amiodarone , apixaban , metoprolol  tartrate, multivitamin with minerals, pantoprazole, rosuvastatin , tadalafil , vitamin C, and vitamin E  History Review  Allergy: Seth Stewart has no known allergies. Drug: Seth Stewart  reports no history of drug use. Alcohol:  reports current alcohol use of about 7.0 - 14.0 standard drinks of alcohol per week. Tobacco:  reports that he has never smoked. He has never used smokeless tobacco. Social: [name]  reports that he has never smoked. He has never used smokeless tobacco. He reports current alcohol use of about 7.0 - 14.0 standard drinks of alcohol per week. He reports that he does not use drugs. Medical:  has a past medical history of AF (atrial fibrillation) (HCC), Back pain, Dysphagia, Dysrhythmia, Hemorrhoids, Hyperlipidemia, and Hypertension. Surgical: Seth Stewart  has a past surgical history that includes Eye surgery (09/21/2009); Laminectomy (09/21/2002); Colonoscopy (12/06/2014); Ablation; Hernia repair (Right, 01/07/2015); Esophagogastroduodenoscopy (N/A, 10/29/2022); Back surgery; Esophagogastroduodenoscopy; Esophagogastroduodenoscopy (egd) with propofol  (N/A, 12/04/2022); and Cardioversion (N/A, 02/09/2024). Family: family history includes Cancer in his mother; Heart attack in his father and paternal uncle; Heart disease in his father.  Laboratory Chemistry Profile   Renal Lab Results  Component Value Date   BUN 11 01/19/2024   CREATININE 0.80 01/19/2024   BCR 14 01/19/2024   GFRAA >60 05/09/2019   GFRNONAA >60 05/09/2019    Hepatic Lab Results  Component Value Date   AST 21 01/03/2024   ALT 16 01/03/2024   ALBUMIN 4.6 01/03/2024   ALKPHOS 47 01/03/2024    Electrolytes Lab Results  Component Value Date    NA 135 01/19/2024   K 4.6 01/19/2024   CL 100 01/19/2024   CALCIUM  10.0 01/19/2024   PHOS 3.3 11/11/2015    Bone No results found for: VD25OH, VD125OH2TOT, CI6874NY7, CI7874NY7, 25OHVITD1, 25OHVITD2, 25OHVITD3, TESTOFREE, TESTOSTERONE  Inflammation (CRP: Acute Phase) (ESR: Chronic Phase) No results found for: CRP, ESRSEDRATE, LATICACIDVEN       Note: Above Lab results reviewed.  Recent Imaging Review  CT CARDIAC MORPH/PULM VEIN W/CM&W/O CA SCORE Addendum: ADDENDUM REPORT: 03/19/2024 15:16   EXAM:  OVER-READ INTERPRETATION  CT CHEST   The following report is an over-read performed by radiologist Dr.  Fonda [facility] on 03/19/2024. This  over-read does not include interpretation of cardiac or coronary  anatomy or pathology. The coronary CTA interpretation by the  cardiologist is attached.   COMPARISON:  01/01/2022.   FINDINGS:  Cardiovascular:  See findings discussed in the body of the report.   Mediastinum/Nodes: No suspicious adenopathy identified. Imaged  mediastinal structures are unremarkable.   Lungs/Pleura: Imaged lungs are clear. No pleural effusion or  pneumothorax.   Upper Abdomen: No acute abnormality.   Musculoskeletal: No chest wall abnormality. No acute osseous  findings. There are thoracic degenerative changes.   IMPRESSION:  No acute extracardiac incidental findings.   Electronically Signed    By: Fonda Field M.D.    On: 03/19/2024 15:16 Narrative: CLINICAL DATA:  Atrial fibrillation scheduled for an ablation.  EXAM: Cardiac CT/CTA  TECHNIQUE: The patient was scanned on a Siemens Somatom scanner.  FINDINGS:  A 120 kV prospective scan was triggered in the descending thoracic aorta at 111 HU's. Gantry rotation speed was 280 msecs and collimation was .9 mm. No beta blockade and no NTG was given. The 3D data set was reconstructed in 5% intervals of the 0-95 % of the R-R cycle. Diastolic  phases were analyzed on a dedicated work station using MPR, MIP and VRT modes. The patient received 75mL OMNIPAQUE  IOHEXOL  350 MG/ML SOLN.  There is normal variant pulmonary vein drainage into the left atrium (3 on the right and 2 on the left).  No anomalous pulmonary venous drainage. No pulmonary vein stenosis.  Normal left atrial appendage, no left atrial appendage thrombus. No intracardiac mass or thrombus.  Normal intraatrial septum with no PFO or ASD.  The esophagus runs in the left atrial midline and is not in the proximity to any of the pulmonary veins.  Aorta:  Normal caliber.  No dissection or calcifications.  Aortic Valve: Trivial calcifications.  Coronary Arteries: Normal coronary origin. Right dominance. The study was performed without use of NTG and insufficient for plaque evaluation. Coronary artery calcium  score is 484, which is 49th percentile for age and sex matched peers.  Pulmonary artery: Normal caliber main pulmonary artery.  IMPRESSION: 1. There is normal variant pulmonary vein drainage into the left atrium. No pulmonary vein stenosis. 2. Normal left atrial appendage, no left atrial appendage thrombus. No intracardiac mass or thrombus. 3. The esophagus runs in the left atrial midline and is not in the proximity to any of the pulmonary veins. 4. Coronary artery calcium  score is 484, which is 49th percentile for age and sex matched peers.  Electronically Signed: By: Soyla Merck M.D. On: 03/10/2024 11:47 Note: Reviewed        Physical Exam  Vitals: BP 117/65   Pulse (!) 59   Temp (!) 97.5 F (36.4 C)   Ht 6' 3 (1.905 m)   Wt 200 lb (90.7 kg)   SpO2 100%   BMI 25.00 kg/m  BMI: Estimated body mass index is 25 kg/m as calculated from the following:   Height as of this encounter: 6' 3 (1.905 m).   Weight as of this encounter: 200 lb (90.7 kg). Ideal: Ideal body weight: 84.5 kg (186 lb 4.6 oz) Adjusted ideal body weight: 87 kg (191 lb  12.4 oz) General appearance: Well nourished, well developed, and well hydrated. In no apparent acute distress Mental status: Alert, oriented x 3 (person, place, & time)       Respiratory: No evidence of acute respiratory distress Eyes: PERLA  Pain overlying left scalp, worse with cervical rotation  Tender to palpation overlying bilateral iliac crest  Assessment   Diagnosis Status  1. Occipital neuralgia of left side   2. Cluneal neuropathy   3. Chronic radicular lumbar pain   4. Radicular pain of thoracic region   5. Chronic pain syndrome    Having a Flare-up Having a Flare-up Controlled   Updated Problems: Problem  Cluneal Neuropathy  Occipital Neuralgia of Left Side  Radicular Pain of Thoracic Region    Plan of Care  Problem-specific:  Assessment and Plan    Occipital neuralgia   Intermittent pain radiates behind the ears, likely due to occipital neuralgia, with severity reaching 8 or 9 out of 10, possibly related to certain neck positions. Occipital nerves from C1 and C2 can cause pain over the scalp and neck when irritated. An occipital nerve block is considered reliable due to preserved anatomy. Order  cervical spine X-rays and consider an MRI if needed. Perform an occipital nerve block on the left side.  Cluneal neuralgia   Pain over the iliac crest is likely due to cluneal neuralgia, with previous spine surgery increasing the risk due to potential nerve injury.  Perform a diagnostic bilateral cluneal nerve block.  Atrial fibrillation   Atrial fibrillation is managed with Eliquis . He is scheduled for a procedure next week with Dr. Cindie. Continue Eliquis  without interruption for the nerve block procedures.    1. Occipital neuralgia of left side (Primary) - GREATER OCCIPITAL NERVE BLOCK; Future  2. Cluneal neuropathy - CLUNEAL NERVE BLOCK; Future  3. Chronic radicular lumbar pain  4. Radicular pain of thoracic region  5. Chronic pain syndrome - CLUNEAL  NERVE BLOCK; Future - GREATER OCCIPITAL NERVE BLOCK; Future        Mr. ABRAN GAVIGAN Sr. has a current medication list which includes the following long-term medication(s): amiodarone , apixaban , metoprolol  tartrate, pantoprazole, and rosuvastatin .  Pharmacotherapy (Medications Ordered): No orders of the defined types were placed in this encounter.  Orders:  Orders Placed This Encounter  Procedures   CLUNEAL NERVE BLOCK    Standing Status:   Future    Expected Date:   04/19/2024    Expiration Date:   03/28/2025    Scheduling Instructions:     Side: Bilateral    Where will this procedure be performed?:   [facility]   GREATER OCCIPITAL NERVE BLOCK    Standing Status:   Future    Expiration Date:   06/28/2024    Scheduling Instructions:     Procedure: Occipital nerve block     Laterality: LEFT     Sedation: without     Timeframe: ASAA    Where will this procedure be performed?:   [facility]     T11/T12 ESI (hx of L1-L5 decompression) 06/2023   Return in about 13 days (around 04/10/2024) for B/L cluneal NB + left occipital NB, in clinic NS.    Recent Visits Date Type Provider Dept  03/06/24 Procedure visit Marcelino Nurse, MD [facility]  02/21/24 Procedure visit Marcelino Nurse, MD [facility]  Showing recent visits within past 90 days and meeting all other requirements Today's Visits Date Type Provider Dept  03/28/24 Office Visit Marcelino Nurse, MD [facility]  Showing today's visits and meeting all other requirements Future Appointments Date Type Provider Dept  04/10/24 Appointment Marcelino Nurse, MD [facility]  Showing future appointments within next 90 days and meeting all other requirements  I discussed the assessment and treatment plan with the patient. The patient was provided an opportunity to ask questions and all were answered. The patient agreed with the plan and demonstrated an understanding of the  instructions.  Patient advised to call back or seek an in-person evaluation if the symptoms or condition worsens.  Duration of encounter: 30 minutes.  Total time on encounter, as per AMA guidelines included both the face-to-face and non-face-to-face time personally spent by the physician and/or other qualified health care professional(s) on the day of the encounter (includes time in activities that require the physician or other qualified health care professional and does not include time in activities normally performed by clinical staff). Physician's time may include the following activities when performed: Preparing to see the patient (e.g., pre-charting review of records, searching for previously ordered imaging, lab work, and nerve conduction tests) Review of prior analgesic pharmacotherapies. Reviewing PMP Interpreting ordered  tests (e.g., lab work, imaging, nerve conduction tests) Performing post-procedure evaluations, including interpretation of diagnostic procedures Obtaining and/or reviewing separately obtained history Performing a medically appropriate examination and/or evaluation Counseling and educating the patient/family/caregiver Ordering medications, tests, or procedures Referring and communicating with other health care professionals (when not separately reported) Documenting clinical information in the electronic or other health record Independently interpreting results (not separately reported) and communicating results to the patient/ family/caregiver Care coordination (not separately reported)  Note by: Wallie Sherry, MD (TTS and AI technology used. I apologize for any typographical errors that were not detected and corrected.) Date: 03/28/2024; Time: 3:08 PM

## 2024-03-29 DIAGNOSIS — Z01812 Encounter for preprocedural laboratory examination: Secondary | ICD-10-CM | POA: Diagnosis not present

## 2024-03-29 DIAGNOSIS — I4819 Other persistent atrial fibrillation: Secondary | ICD-10-CM | POA: Diagnosis not present

## 2024-03-30 LAB — BASIC METABOLIC PANEL WITH GFR
BUN/Creatinine Ratio: 12 (ref 10–24)
BUN: 11 mg/dL (ref 8–27)
CO2: 21 mmol/L (ref 20–29)
Calcium: 9.7 mg/dL (ref 8.6–10.2)
Chloride: 98 mmol/L (ref 96–106)
Creatinine, Ser: 0.94 mg/dL (ref 0.76–1.27)
Glucose: 124 mg/dL — ABNORMAL HIGH (ref 70–99)
Potassium: 4.5 mmol/L (ref 3.5–5.2)
Sodium: 134 mmol/L (ref 134–144)
eGFR: 80 mL/min/1.73 (ref >59)

## 2024-03-30 LAB — CBC
Hematocrit: 40 % (ref 37.5–51.0)
Hemoglobin: 12.8 g/dL — ABNORMAL LOW (ref 13.0–17.7)
MCH: 31.9 pg (ref 26.6–33.0)
MCHC: 32 g/dL (ref 31.5–35.7)
MCV: 100 fL — ABNORMAL HIGH (ref 79–97)
Platelets: 233 x10E3/uL (ref 150–450)
RBC: 4.01 x10E6/uL — ABNORMAL LOW (ref 4.14–5.80)
RDW: 13.2 % (ref 11.6–15.4)
WBC: 6.2 x10E3/uL (ref 3.4–10.8)

## 2024-03-31 ENCOUNTER — Other Ambulatory Visit: Payer: Self-pay

## 2024-03-31 DIAGNOSIS — I4819 Other persistent atrial fibrillation: Secondary | ICD-10-CM

## 2024-03-31 MED ORDER — APIXABAN 5 MG PO TABS
5.0000 mg | ORAL_TABLET | Freq: Two times a day (BID) | ORAL | 5 refills | Status: DC
Start: 2024-03-31 — End: 2024-08-04

## 2024-03-31 NOTE — Telephone Encounter (Signed)
 Patient returned call to discuss upcoming procedure.   CT: completed.  Labs: completed.   Any recent signs of acute illness or been started on antibiotics? No Any new medications started? No Any medications to hold? No  Any missed doses of blood thinner? No  Advised patient to continue taking ANTICOAGULANT: Eliquis  (Apixaban ) twice daily without missing any doses.  Medication instructions:  On the morning of your procedure DO NOT take any medication., including Eliquis  or the procedure may be rescheduled. Nothing to eat or drink after midnight prior to your procedure.  Confirmed patient is scheduled for Atrial Fibrillation Ablation on Monday, July 14 with Dr. Ole Holts. Instructed patient to arrive at the Main Entrance A at [facility]: [address] [place], [place] [address] and check in at Admitting at 8:00 AM.  Advised of plan to go home the same day and will only stay overnight if medically necessary. You MUST have a responsible adult to drive you home and MUST be with you the first 24 hours after you arrive home or your procedure could be cancelled.  Patient verbalized understanding to all instructions provided and agreed to proceed with procedure.

## 2024-03-31 NOTE — Pre-Procedure Instructions (Signed)
 Instructed patient on the following items: Arrival time 0800 Nothing to eat or drink after midnight No meds AM of procedure Responsible person to drive you home and stay with you for 24 hrs  Have you missed any doses of anti-coagulant Elqiuis- takes twice a day, hasn't missed any doses.  Don't take dose morning of procedure.

## 2024-03-31 NOTE — Telephone Encounter (Signed)
 Spoke with the patient and reviewed instructions for his ablation on Monday including address, arrival time, NPO after midnight. He confirms that he has not missed any doses of his Elquis. All questions answered and patient verbalized understanding.

## 2024-03-31 NOTE — Telephone Encounter (Signed)
 Prescription refill request for Eliquis  received. Indication: Afib  Last office visit: 03/01/24 Marny)  Scr: 0.94 (03/29/24)  Age: 83 Weight: 90.7kg  Appropriate dose. Refill sent.

## 2024-04-02 ENCOUNTER — Other Ambulatory Visit: Payer: Self-pay | Admitting: Cardiovascular Disease

## 2024-04-02 NOTE — Anesthesia Preprocedure Evaluation (Addendum)
 Anesthesia Evaluation  Patient identified by MRN, date of birth, ID band Patient awake    Reviewed: Allergy & Precautions, NPO status , Patient's Chart, lab work & pertinent test results, reviewed documented beta blocker date and time   History of Anesthesia Complications Negative for: history of anesthetic complications  Airway Mallampati: II  TM Distance: >3 FB Neck ROM: Full    Dental no notable dental hx.    Pulmonary neg pulmonary ROS   Pulmonary exam normal        Cardiovascular hypertension, Pt. on home beta blockers and Pt. on medications Normal cardiovascular exam+ dysrhythmias (on Eliquis , amiodarone ) Atrial Fibrillation   TTE 02/01/24: EF 55-60%, mildly elevated PASP, RVSP 39.3 mmHg, moderate LAE, mild MR, mild AR    Neuro/Psych  Headaches    GI/Hepatic Neg liver ROS,GERD  Medicated and Controlled,,  Endo/Other  negative endocrine ROS    Renal/GU negative Renal ROS  negative genitourinary   Musculoskeletal negative musculoskeletal ROS (+)    Abdominal   Peds  Hematology  (+) Blood dyscrasia (Hgb 12.8), anemia   Anesthesia Other Findings Day of surgery medications reviewed with patient.  Reproductive/Obstetrics                              Anesthesia Physical Anesthesia Plan  ASA: 3  Anesthesia Plan: General   Post-op Pain Management: Minimal or no pain anticipated   Induction: Intravenous  PONV Risk Score and Plan: 2 and Treatment may vary due to age or medical condition, Ondansetron  and Dexamethasone   Airway Management Planned: Oral ETT  Additional Equipment: None  Intra-op Plan:   Post-operative Plan: Extubation in OR  Informed Consent: I have reviewed the patients History and Physical, chart, labs and discussed the procedure including the risks, benefits and alternatives for the proposed anesthesia with the patient or authorized representative who has indicated  his/her understanding and acceptance.     Dental advisory given  Plan Discussed with: CRNA  Anesthesia Plan Comments:          Anesthesia Quick Evaluation

## 2024-04-03 ENCOUNTER — Ambulatory Visit (HOSPITAL_COMMUNITY)
Admission: RE | Admit: 2024-04-03 | Discharge: 2024-04-03 | Disposition: A | Discharge status: Home / Self Care | Attending: Cardiology | Admitting: Cardiology

## 2024-04-03 ENCOUNTER — Ambulatory Visit (HOSPITAL_COMMUNITY): Payer: Self-pay | Admitting: Anesthesiology

## 2024-04-03 ENCOUNTER — Ambulatory Visit (HOSPITAL_COMMUNITY)
Admission: RE | Disposition: A | Discharge status: Home / Self Care | Payer: Self-pay | Source: Home / Self Care | Attending: Cardiology

## 2024-04-03 ENCOUNTER — Encounter (HOSPITAL_COMMUNITY): Payer: Self-pay | Admitting: Cardiology

## 2024-04-03 ENCOUNTER — Other Ambulatory Visit (HOSPITAL_COMMUNITY): Payer: Self-pay

## 2024-04-03 ENCOUNTER — Other Ambulatory Visit: Payer: Self-pay

## 2024-04-03 DIAGNOSIS — I4891 Unspecified atrial fibrillation: Secondary | ICD-10-CM | POA: Diagnosis not present

## 2024-04-03 DIAGNOSIS — I4892 Unspecified atrial flutter: Secondary | ICD-10-CM | POA: Insufficient documentation

## 2024-04-03 DIAGNOSIS — I4819 Other persistent atrial fibrillation: Secondary | ICD-10-CM | POA: Diagnosis not present

## 2024-04-03 DIAGNOSIS — K219 Gastro-esophageal reflux disease without esophagitis: Secondary | ICD-10-CM | POA: Insufficient documentation

## 2024-04-03 DIAGNOSIS — E785 Hyperlipidemia, unspecified: Secondary | ICD-10-CM | POA: Diagnosis not present

## 2024-04-03 DIAGNOSIS — Z79899 Other long term (current) drug therapy: Secondary | ICD-10-CM | POA: Insufficient documentation

## 2024-04-03 DIAGNOSIS — I10 Essential (primary) hypertension: Secondary | ICD-10-CM

## 2024-04-03 HISTORY — PX: ATRIAL FIBRILLATION ABLATION: EP1191

## 2024-04-03 MED ORDER — LIDOCAINE 2% (20 MG/ML) 5 ML SYRINGE
INTRAMUSCULAR | Status: DC | PRN
  Administered 2024-04-03: 100 mg via INTRAVENOUS

## 2024-04-03 MED ORDER — PROTAMINE SULFATE 10 MG/ML IV SOLN
INTRAVENOUS | Status: DC | PRN
  Administered 2024-04-03: 35 mg via INTRAVENOUS

## 2024-04-03 MED ORDER — COLCHICINE 0.6 MG PO TABS
0.6000 mg | ORAL_TABLET | Freq: Two times a day (BID) | ORAL | 0 refills | Status: DC
  Filled 2024-04-03: qty 10, 5d supply, fill #0

## 2024-04-03 MED ORDER — COLCHICINE 0.6 MG PO TABS
0.6000 mg | ORAL_TABLET | Freq: Two times a day (BID) | ORAL | Status: DC
  Administered 2024-04-03: 0.6 mg via ORAL
  Filled 2024-04-03 (×2): qty 1

## 2024-04-03 MED ORDER — SODIUM CHLORIDE 0.9 % IV SOLN
INTRAVENOUS | Status: DC
Start: 2024-04-03 — End: 2024-04-03

## 2024-04-03 MED ORDER — ROCURONIUM BROMIDE 10 MG/ML (PF) SYRINGE
PREFILLED_SYRINGE | INTRAVENOUS | Status: DC | PRN
  Administered 2024-04-03: 20 mg via INTRAVENOUS
  Administered 2024-04-03: 60 mg via INTRAVENOUS
  Administered 2024-04-03: 20 mg via INTRAVENOUS

## 2024-04-03 MED ORDER — PHENYLEPHRINE 80 MCG/ML (10ML) SYRINGE FOR IV PUSH (FOR BLOOD PRESSURE SUPPORT)
PREFILLED_SYRINGE | INTRAVENOUS | Status: DC | PRN
  Administered 2024-04-03: 80 ug via INTRAVENOUS

## 2024-04-03 MED ORDER — SUGAMMADEX SODIUM 200 MG/2ML IV SOLN
INTRAVENOUS | Status: DC | PRN
  Administered 2024-04-03: 400 mg via INTRAVENOUS

## 2024-04-03 MED ORDER — PHENYLEPHRINE HCL-NACL 20-0.9 MG/250ML-% IV SOLN
INTRAVENOUS | Status: DC | PRN
  Administered 2024-04-03: 15 ug/min via INTRAVENOUS

## 2024-04-03 MED ORDER — SODIUM CHLORIDE 0.9% FLUSH: 3.0000 mL | Freq: Two times a day (BID) | INTRAVENOUS | Status: DC

## 2024-04-03 MED ORDER — PANTOPRAZOLE SODIUM 40 MG PO TBEC
40.0000 mg | DELAYED_RELEASE_TABLET | Freq: Every day | ORAL | Status: DC
  Administered 2024-04-03: 40 mg via ORAL
  Filled 2024-04-03 (×2): qty 1

## 2024-04-03 MED ORDER — PROPOFOL 10 MG/ML IV BOLUS
INTRAVENOUS | Status: DC | PRN
  Administered 2024-04-03: 120 mg via INTRAVENOUS
  Administered 2024-04-03: 30 mg via INTRAVENOUS

## 2024-04-03 MED ORDER — PANTOPRAZOLE SODIUM 40 MG PO TBEC
40.0000 mg | DELAYED_RELEASE_TABLET | Freq: Every day | ORAL | 0 refills | Status: DC
  Filled 2024-04-03: qty 45, 45d supply, fill #0

## 2024-04-03 MED ORDER — ACETAMINOPHEN 325 MG PO TABS: 650.0000 mg | ORAL_TABLET | ORAL | Status: DC | PRN

## 2024-04-03 MED ORDER — FENTANYL CITRATE (PF) 100 MCG/2ML IJ SOLN
INTRAMUSCULAR | Status: AC
  Filled 2024-04-03: qty 2

## 2024-04-03 MED ORDER — ONDANSETRON HCL 4 MG/2ML IJ SOLN: 4.0000 mg | Freq: Four times a day (QID) | INTRAMUSCULAR | Status: DC | PRN

## 2024-04-03 MED ORDER — ONDANSETRON HCL 4 MG/2ML IJ SOLN
INTRAMUSCULAR | Status: DC | PRN
Start: 2024-04-03 — End: 2024-04-03
  Administered 2024-04-03: 4 mg via INTRAVENOUS

## 2024-04-03 MED ORDER — GLYCOPYRROLATE 0.2 MG/ML IJ SOLN
INTRAMUSCULAR | Status: DC | PRN
  Administered 2024-04-03: .2 mg via INTRAVENOUS

## 2024-04-03 MED ORDER — DEXAMETHASONE SODIUM PHOSPHATE 10 MG/ML IJ SOLN
INTRAMUSCULAR | Status: DC | PRN
  Administered 2024-04-03: 5 mg via INTRAVENOUS

## 2024-04-03 MED ORDER — APIXABAN 5 MG PO TABS
5.0000 mg | ORAL_TABLET | Freq: Two times a day (BID) | ORAL | Status: DC
  Administered 2024-04-03: 5 mg via ORAL
  Filled 2024-04-03: qty 1

## 2024-04-03 MED ORDER — HEPARIN SODIUM (PORCINE) 1000 UNIT/ML IJ SOLN
INTRAMUSCULAR | Status: DC | PRN
Start: 2024-04-03 — End: 2024-04-03
  Administered 2024-04-03: 4000 [IU] via INTRAVENOUS
  Administered 2024-04-03: 15000 [IU] via INTRAVENOUS

## 2024-04-03 MED ORDER — ATROPINE SULFATE 1 MG/ML IV SOLN
INTRAVENOUS | Status: DC | PRN
Start: 2024-04-03 — End: 2024-04-03
  Administered 2024-04-03: 1 mg via INTRAVENOUS

## 2024-04-03 MED ORDER — SODIUM CHLORIDE 0.9% FLUSH: 3.0000 mL | INTRAVENOUS | Status: DC | PRN

## 2024-04-03 MED ORDER — EPHEDRINE SULFATE-NACL 50-0.9 MG/10ML-% IV SOSY
PREFILLED_SYRINGE | INTRAVENOUS | Status: DC | PRN
  Administered 2024-04-03: 5 mg via INTRAVENOUS

## 2024-04-03 MED ORDER — FENTANYL CITRATE (PF) 250 MCG/5ML IJ SOLN
INTRAMUSCULAR | Status: DC | PRN
  Administered 2024-04-03: 100 ug via INTRAVENOUS

## 2024-04-03 MED ORDER — SODIUM CHLORIDE 0.9 % IV SOLN: 250.0000 mL | INTRAVENOUS | Status: DC | PRN

## 2024-04-03 MED ORDER — HEPARIN (PORCINE) IN NACL 1000-0.9 UT/500ML-% IV SOLN
INTRAVENOUS | Status: DC | PRN
  Administered 2024-04-03 (×3): 500 mL

## 2024-04-03 NOTE — Progress Notes (Signed)
 Dr. Cindie called and made aware of unifocal PVC's; sometimes trigiminy. No orders.

## 2024-04-03 NOTE — Anesthesia Postprocedure Evaluation (Signed)
 Anesthesia Post Note  Patient: Seth MUTO Sr.  Procedure(s) Performed: ATRIAL FIBRILLATION ABLATION     Patient location during evaluation: PACU Anesthesia Type: General Level of consciousness: awake and alert Pain management: pain level controlled Vital Signs Assessment: post-procedure vital signs reviewed and stable Respiratory status: spontaneous breathing, nonlabored ventilation and respiratory function stable Cardiovascular status: blood pressure returned to baseline Postop Assessment: no apparent nausea or vomiting Anesthetic complications: no   No notable events documented.  Last Vitals:  Vitals:   04/03/24 1240 04/03/24 1245  BP: (!) 157/70 (!) 157/76  Pulse: 64 65  Resp: (!) 8 16  Temp:    SpO2: 96% 93%    Last Pain:  Vitals:   04/03/24 1218  TempSrc: Oral  PainSc: 0-No pain                 Vertell Row

## 2024-04-03 NOTE — Discharge Instructions (Signed)

## 2024-04-03 NOTE — H&P (Signed)
 Electrophysiology Office Follow up Visit Note:     Date:  04/03/2024    ID:  Seth Elspeth Holm Sr., DOB 10/19/1940, MRN [id number]   PCP:  Joshua Cathryne BROCKS, MD (Inactive)          [facility] Cardiologist:  Deatrice Cage, MD  [facility] Electrophysiologist:  OLE ONEIDA HOLTS, MD      Interval History:       Seth Gander Sr. is a 83 y.o. male who presents for a follow up visit.    I last saw him 01/19/2024. At the last appointment I recommended DCCV. This was perfomred 02/09/2024. He is currently scheduled for redo PVI on 04/03/2024.    He is doing well today.  He he thinks he has maintained sinus rhythm.  His heart rates are slightly lower, now in the 40s.  He is having back injection on Monday.  Presents for AF ablation. Procedure reviewed.  Objective Past medical, surgical, social and family history were reviewed.   ROS:   Please see the history of present illness.    All other systems reviewed and are negative.   EKGs/Labs/Other Studies Reviewed:     The following studies were reviewed today:         EKG Interpretation Date/Time:                  Wednesday March 01 2024 13:59:18 EDT Ventricular Rate:         51 PR Interval:                 270 QRS Duration:             104 QT Interval:                 488 QTC Calculation:449 R Axis:                         30   Text Interpretation:Sinus bradycardia with 1st degree A-V block with occasional Premature ventricular complexes Confirmed by HOLTS OLE [phone]) on 03/01/2024 2:06:03 PM     Physical Exam:     VS:  BP 177/68 (BP Location: Left Arm)   Pulse 56   Ht 6' 3 (1.905 m)   Wt 204 lb (92.5 kg)   SpO2 97%   BMI 25.50 kg/m         Wt Readings from Last 3 Encounters:  03/01/24 204 lb (92.5 kg)  01/25/24 206 lb 11.2 oz (93.8 kg)  01/19/24 206 lb (93.4 kg)      GEN: no distress CARD: RRR, No MRG RESP: No IWOB. CTAB.     Assessment ASSESSMENT:     1. Persistent atrial fibrillation  (HCC)   2. Atrial flutter, unspecified type (HCC)     PLAN:     In order of problems listed above:   #Persistent atrial fibrillation and flutter S/p ablation of flutter years ago at [facility]. Recently minireload with Amio and DCCV on 02/09/2024. PVI planned 04/03/2024. Continue OAC.   Discussed treatment options today for AF including antiarrhythmic drug therapy and ablation. Discussed risks, recovery and likelihood of success with each treatment strategy. Risk, benefits, and alternatives to EP study and ablation for afib were discussed. These risks include but are not limited to stroke, bleeding, vascular damage, tamponade, perforation, damage to the esophagus, lungs, phrenic nerve and other structures, pulmonary vein stenosis, worsening renal function, coronary vasospasm and death.  Discussed potential need for repeat ablation  procedures and antiarrhythmic drugs after an initial ablation. The patient understands these risk and wishes to proceed.  We will therefore proceed with catheter ablation at the next available time.  Carto, ICE, anesthesia are requested for the procedure.  Will also obtain CT PV protocol prior to the procedure to exclude LAA thrombus and further evaluate atrial anatomy.   Presents for redo AF ablation. Procedure reviewed.    Signed, Ole Holts, MD, [facility], [facility] 04/03/2024 Electrophysiology [facility]

## 2024-04-03 NOTE — Anesthesia Procedure Notes (Signed)
 Procedure Name: Intubation Date/Time: 04/03/2024 10:42 AM  Performed by: Elby Raelene SAUNDERS, CRNAPre-anesthesia Checklist: Patient identified, Emergency Drugs available, Suction available and Patient being monitored Patient Re-evaluated:Patient Re-evaluated prior to induction Oxygen Delivery Method: Circle System Utilized Preoxygenation: Pre-oxygenation with 100% oxygen Induction Type: IV induction and Cricoid Pressure applied Ventilation: Mask ventilation without difficulty Laryngoscope Size: Miller and 2 Grade View: Grade II Tube type: Oral Tube size: 7.5 mm Number of attempts: 1 Airway Equipment and Method: Stylet and Oral airway Placement Confirmation: ETT inserted through vocal cords under direct vision, positive ETCO2 and breath sounds checked- equal and bilateral Secured at: 24 cm Tube secured with: Tape Dental Injury: Teeth and Oropharynx as per pre-operative assessment

## 2024-04-03 NOTE — Transfer of Care (Signed)
 Immediate Anesthesia Transfer of Care Note  Patient: Seth GRABEL Sr.  Procedure(s) Performed: ATRIAL FIBRILLATION ABLATION  Patient Location: Cath Lab  Anesthesia Type:General  Level of Consciousness: awake, alert , and oriented  Airway & Oxygen Therapy: Patient Spontanous Breathing  Post-op Assessment: Report given to RN and Post -op Vital signs reviewed and stable  Post vital signs: Reviewed and stable  Last Vitals:  Vitals Value Taken Time  BP 139/71 04/03/24 12:20  Temp 36.6 C 04/03/24 12:18  Pulse 64 04/03/24 12:21  Resp 8 04/03/24 12:21  SpO2 92 % 04/03/24 12:21  Vitals shown include unfiled device data.  Last Pain:  Vitals:   04/03/24 1218  TempSrc: Oral  PainSc: 0-No pain         Complications: No notable events documented.

## 2024-04-04 ENCOUNTER — Encounter (HOSPITAL_COMMUNITY): Payer: Self-pay | Admitting: Cardiology

## 2024-04-04 ENCOUNTER — Telehealth (HOSPITAL_COMMUNITY): Payer: Self-pay

## 2024-04-04 LAB — POCT ACTIVATED CLOTTING TIME: Activated Clotting Time: 314 s

## 2024-04-04 NOTE — Telephone Encounter (Signed)
 Spoke with patient to complete post procedure follow up call.  Patient reports no complications with groin sites.   Instructions reviewed with patient:  Remove large bandage at puncture site after 24 hours. It is normal to have bruising, tenderness, mild swelling, and a pea or marble sized lump/knot at the groin site which can take up to three months to resolve.  Get help right away if you notice sudden swelling at the puncture site.  Check your puncture site every day for signs of infection: fever, redness, swelling, pus drainage, warmth, foul odor or excessive pain. If this occurs, please call the office at [phone], to speak with the nurse. Get help right away if your puncture site is bleeding and the bleeding does not stop after applying firm pressure to the area.  You may continue to have skipped beats/ atrial fibrillation during the first several months after your procedure.  It is very important not to miss any doses of your blood thinner Eliquis .    You will follow up with the Afib clinic on 05/09/24 and follow up with the APP on 07/03/24.   Patient verbalized understanding to all instructions provided.

## 2024-04-05 ENCOUNTER — Ambulatory Visit
Admission: RE | Admit: 2024-04-05 | Discharge: 2024-04-05 | Disposition: A | Discharge status: Home / Self Care | Source: Ambulatory Visit | Attending: Student in an Organized Health Care Education/Training Program | Admitting: Student in an Organized Health Care Education/Training Program

## 2024-04-05 ENCOUNTER — Ambulatory Visit
Attending: Student in an Organized Health Care Education/Training Program | Admitting: Student in an Organized Health Care Education/Training Program

## 2024-04-05 ENCOUNTER — Other Ambulatory Visit: Payer: Self-pay | Admitting: Student in an Organized Health Care Education/Training Program

## 2024-04-05 ENCOUNTER — Encounter: Payer: Self-pay | Admitting: Student in an Organized Health Care Education/Training Program

## 2024-04-05 VITALS — BP 165/77 | HR 52 | Temp 97.2°F | Resp 17

## 2024-04-05 DIAGNOSIS — G894 Chronic pain syndrome: Secondary | ICD-10-CM

## 2024-04-05 DIAGNOSIS — G589 Mononeuropathy, unspecified: Secondary | ICD-10-CM | POA: Insufficient documentation

## 2024-04-05 DIAGNOSIS — M542 Cervicalgia: Secondary | ICD-10-CM | POA: Insufficient documentation

## 2024-04-05 DIAGNOSIS — G588 Other specified mononeuropathies: Secondary | ICD-10-CM | POA: Diagnosis not present

## 2024-04-05 DIAGNOSIS — M5481 Occipital neuralgia: Secondary | ICD-10-CM | POA: Insufficient documentation

## 2024-04-05 MED ORDER — DEXAMETHASONE SODIUM PHOSPHATE 10 MG/ML IJ SOLN
20.0000 mg | Freq: Once | INTRAMUSCULAR | Status: AC
  Administered 2024-04-05: 20 mg
  Filled 2024-04-05: qty 2

## 2024-04-05 MED ORDER — LIDOCAINE HCL 2 % IJ SOLN
20.0000 mL | Freq: Once | INTRAMUSCULAR | Status: AC
  Administered 2024-04-05: 400 mg
  Filled 2024-04-05: qty 40

## 2024-04-05 MED ORDER — DEXAMETHASONE SODIUM PHOSPHATE 10 MG/ML IJ SOLN
10.0000 mg | Freq: Once | INTRAMUSCULAR | Status: AC
  Administered 2024-04-05: 10 mg
  Filled 2024-04-05: qty 1

## 2024-04-05 MED ORDER — ROPIVACAINE HCL 2 MG/ML IJ SOLN
18.0000 mL | Freq: Once | INTRAMUSCULAR | Status: AC
  Administered 2024-04-05: 18 mL via PERINEURAL
  Filled 2024-04-05: qty 20

## 2024-04-05 MED FILL — Fentanyl Citrate Preservative Free (PF) Inj 100 MCG/2ML: INTRAMUSCULAR | Qty: 2 | Status: AC

## 2024-04-05 NOTE — Patient Instructions (Signed)

## 2024-04-05 NOTE — Progress Notes (Signed)
 PROVIDER NOTE: Interpretation of information contained herein should be left to medically-trained personnel. Specific patient instructions are provided elsewhere under Patient Instructions section of medical record. This document was created in part using STT-dictation technology, any transcriptional errors that may result from this process are unintentional.  Patient: Seth Stewart Sr. Type: Established DOB: 1940-11-26 MRN: [id number] PCP: Joshua Cathryne BROCKS, MD (Inactive)  Service: Procedure DOS: 04/05/2024 Setting: Ambulatory Location: Ambulatory outpatient facility Delivery: Face-to-face Provider: Wallie Sherry, MD Specialty: Interventional Pain Management Specialty designation: 09 Location: Outpatient facility Ref. Prov.: No ref. provider found       Interventional Therapy   Primary Reason for Visit: Interventional Pain Management Treatment. CC: Neck Pain    Procedure:          Anesthesia, Analgesia, Anxiolysis:  Type: Diagnostic, Greater, Occipital Nerve Block           Region: Posterolateral Cervical Level: Occipital Ridge   Laterality: Left  Anesthesia: Local (1-2% Lidocaine )  Anxiolysis: None  Sedation: None  Guidance: Fluoroscopy           Position: Prone   1. Occipital neuralgia of left side   2. Cluneal neuropathy    NAS-11 Pain score:   Pre-procedure: 4 /10   Post-procedure: 4 /10     H&P (Pre-op Assessment):  Seth Stewart is a 83 y.o. (year old), male patient, seen today for interventional treatment. He  has a past surgical history that includes Eye surgery (09/21/2009); Laminectomy (09/21/2002); Colonoscopy (12/06/2014); Ablation; Hernia repair (Right, 01/07/2015); Esophagogastroduodenoscopy (N/A, 10/29/2022); Back surgery; Esophagogastroduodenoscopy; Esophagogastroduodenoscopy (egd) with propofol  (N/A, 12/04/2022); Cardioversion (N/A, 02/09/2024); and ATRIAL FIBRILLATION ABLATION (N/A, 04/03/2024). Mr. Beaumier has a current medication list which includes the  following prescription(s): acetaminophen , amiodarone , apixaban , vitamin c, colchicine , flaxseed (linseed), lactobacillus, metoprolol  tartrate, multivitamin with minerals, omega-3, pantoprazole , rosuvastatin , tadalafil , and vitamin e, and the following Facility-Administered Medications: dexamethasone , dexamethasone , lidocaine , and ropivacaine  (pf) 2 mg/ml (0.2%). His primarily concern today is the Neck Pain  Initial Vital Signs:  Pulse/HCG Rate: (!) 52  Temp: (!) 97.2 F (36.2 C) Resp: 16 BP: (!) 115/100 SpO2: 99 %  BMI: Estimated body mass index is 24.87 kg/m as calculated from the following:   Height as of 04/03/24: 6' 3 (1.905 m).   Weight as of 04/03/24: 199 lb (90.3 kg).  Risk Assessment: Allergies: Reviewed. He has no known allergies.  Allergy Precautions: None required Coagulopathies: Reviewed. None identified.  Blood-thinner therapy: None at this time Active Infection(s): Reviewed. None identified. Mr. Vi is afebrile  Site Confirmation: Mr. Duman was asked to confirm the procedure and laterality before marking the site Procedure checklist: Completed Consent: Before the procedure and under the influence of no sedative(s), amnesic(s), or anxiolytics, the patient was informed of the treatment options, risks and possible complications. To fulfill our ethical and legal obligations, as recommended by the American Medical Association's Code of Ethics, I have informed the patient of my clinical impression; the nature and purpose of the treatment or procedure; the risks, benefits, and possible complications of the intervention; the alternatives, including doing nothing; the risk(s) and benefit(s) of the alternative treatment(s) or procedure(s); and the risk(s) and benefit(s) of doing nothing. The patient was provided information about the general risks and possible complications associated with the procedure. These may include, but are not limited to: failure to achieve desired goals,  infection, bleeding, organ or nerve damage, allergic reactions, paralysis, and death. In addition, the patient was informed of those risks and complications associated to the procedure, such  as failure to decrease pain; infection; bleeding; organ or nerve damage with subsequent damage to sensory, motor, and/or autonomic systems, resulting in permanent pain, numbness, and/or weakness of one or several areas of the body; allergic reactions; (i.e.: anaphylactic reaction); and/or death. Furthermore, the patient was informed of those risks and complications associated with the medications. These include, but are not limited to: allergic reactions (i.e.: anaphylactic or anaphylactoid reaction(s)); adrenal axis suppression; blood sugar elevation that in diabetics may result in ketoacidosis or comma; water retention that in patients with history of congestive heart failure may result in shortness of breath, pulmonary edema, and decompensation with resultant heart failure; weight gain; swelling or edema; medication-induced neural toxicity; particulate matter embolism and blood vessel occlusion with resultant organ, and/or nervous system infarction; and/or aseptic necrosis of one or more joints. Finally, the patient was informed that Medicine is not an exact science; therefore, there is also the possibility of unforeseen or unpredictable risks and/or possible complications that may result in a catastrophic outcome. The patient indicated having understood very clearly. We have given the patient no guarantees and we have made no promises. Enough time was given to the patient to ask questions, all of which were answered to the patient's satisfaction. Mr. Duffner has indicated that he wanted to continue with the procedure. Attestation: I, the ordering provider, attest that I have discussed with the patient the benefits, risks, side-effects, alternatives, likelihood of achieving goals, and potential problems during recovery for  the procedure that I have provided informed consent. Date  Time: 04/05/2024 11:12 AM  Pre-Procedure Preparation:  Monitoring: As per clinic protocol. Respiration, ETCO2, SpO2, BP, heart rate and rhythm monitor placed and checked for adequate function Safety Precautions: Patient was assessed for positional comfort and pressure points before starting the procedure. Time-out: I initiated and conducted the Time-out before starting the procedure, as per protocol. The patient was asked to participate by confirming the accuracy of the Time Out information. Verification of the correct person, site, and procedure were performed and confirmed by me, the nursing staff, and the patient. Time-out conducted as per Joint Commission's Universal Protocol (UP.01.01.01). Time:   Start Time:   hrs.  Description of Procedure:          Target Area: Area medial to the occipital artery at the level of the superior nuchal ridge Approach: Posterior approach Area Prepped: Entire Posterior Occipital Region ChloraPrep (2% chlorhexidine gluconate and 70% isopropyl alcohol) Safety Precautions: Aspiration looking for blood return was conducted prior to all injections. At no point did we inject any substances, as a needle was being advanced. No attempts were made at seeking any paresthesias. Safe injection practices and needle disposal techniques used. Medications properly checked for expiration dates. SDV (single dose vial) medications used. Description of the Procedure: Protocol guidelines were followed. The target area was identified and the area prepped in the usual manner. Skin & deeper tissues infiltrated with local anesthetic. Appropriate amount of time allowed to pass for local anesthetics to take effect. The procedure needles were then advanced to the target area. Proper needle placement secured. Negative aspiration confirmed. Solution injected in intermittent fashion, asking for systemic symptoms every 0.5cc of  injectate. The needles were then removed and the area cleansed, making sure to leave some of the prepping solution back to take advantage of its long term bactericidal properties.  Vitals:   04/05/24 1122 04/05/24 1123  BP: (!) 115/100 (!) 149/72  Pulse: (!) 52   Resp: 16   Temp: ROLLEN)  97.2 F (36.2 C)   TempSrc: Temporal   SpO2: 99%     Start Time:   hrs. End Time:   hrs. Materials:  Needle(s) Type: Regular needle Gauge: 22G Length: 1.5-in Medication(s): Please see orders for medications and dosing details. 5 cc solution made of 4 cc of 0.2% ropivacaine , 1 cc of Decadron  10 mg/cc.  Injected for the left greater occipital nerve Imaging Guidance (Non-Spinal):          Type of Imaging Technique: Fluoroscopy Guidance (Non-Spinal) Indication(s): Fluoroscopy guidance for needle placement to enhance accuracy in procedures requiring precise needle localization for targeted delivery of medication in or near specific anatomical locations not easily accessible without such real-time imaging assistance. Exposure Time: Please see nurses notes. Contrast: None used. Fluoroscopic Guidance: I was personally present during the use of fluoroscopy. Tunnel Vision Technique used to obtain the best possible view of the target area. Parallax error corrected before commencing the procedure. Direction-depth-direction technique used to introduce the needle under continuous pulsed fluoroscopy. Once target was reached, antero-posterior, oblique, and lateral fluoroscopic projection used confirm needle placement in all planes. Images permanently stored in EMR. Interpretation: No contrast injected. I personally interpreted the imaging intraoperatively. Adequate needle placement confirmed in multiple planes. Permanent images saved into the patient's record.  Antibiotic Prophylaxis:   Anti-infectives (From admission, onward)    None      Indication(s): None identified  Post-operative Assessment:   Post-procedure Vital Signs:  Pulse/HCG Rate: (!) 52  Temp: (!) 97.2 F (36.2 C) Resp: 16 BP: (!) 149/72 SpO2: 99 %  EBL: None  Complications: No immediate post-treatment complications observed by team, or reported by patient.  Note: The patient tolerated the entire procedure well. A repeat set of vitals were taken after the procedure and the patient was kept under observation following institutional policy, for this type of procedure. Post-procedural neurological assessment was performed, showing return to baseline, prior to discharge. The patient was provided with post-procedure discharge instructions, including a section on how to identify potential problems. Should any problems arise concerning this procedure, the patient was given instructions to immediately contact us , at any time, without hesitation. In any case, we plan to contact the patient by telephone for a follow-up status report regarding this interventional procedure.  Comments:  No additional relevant information.  Plan of Care (POC)    Medications ordered for procedure: Meds ordered this encounter  Medications   lidocaine  (XYLOCAINE ) 2 % (with pres) injection 400 mg   dexamethasone  (DECADRON ) injection 20 mg   ropivacaine  (PF) 2 mg/mL (0.2%) (NAROPIN ) injection 18 mL   dexamethasone  (DECADRON ) injection 10 mg   Medications administered: Kmarion S. Alben Karolee Shed had no medications administered during this visit.  See the medical record for exact dosing, route, and time of administration.    T11/T12 ESI (hx of L1-L5 decompression) 06/2023     Follow-up plan:   Return in about 5 weeks (around 05/10/2024) for PPE, F2F.     Recent Visits Date Type Provider Dept  03/28/24 Office Visit Marcelino Nurse, MD [facility]  03/06/24 Procedure visit Marcelino Nurse, MD [facility]  02/21/24 Procedure visit Marcelino Nurse, MD [facility]  Showing recent visits within past 90 days and meeting  all other requirements Today's Visits Date Type Provider Dept  04/05/24 Procedure visit Marcelino Nurse, MD [facility]  Showing today's visits and meeting all other requirements Future Appointments Date Type Provider Dept  05/11/24 Appointment Marcelino Nurse, MD [facility]  Showing future appointments within next 90 days and meeting all other requirements   Disposition: Discharge home  Discharge (Date  Time): 04/05/2024; 1200 hrs.   Primary Care Physician: Joshua Cathryne BROCKS, MD (Inactive) Location: [facility] Note by: Wallie Sherry, MD (TTS technology used. I apologize for any typographical errors that were not detected and corrected.) Date: 04/05/2024; Time: 1:22 PM  Disclaimer:  Medicine is not an Visual merchandiser. The only guarantee in medicine is that nothing is guaranteed. It is important to note that the decision to proceed with this intervention was based on the information collected from the patient. The Data and conclusions were drawn from the patient's questionnaire, the interview, and the physical examination. Because the information was provided in large part by the patient, it cannot be guaranteed that it has not been purposely or unconsciously manipulated. Every effort has been made to obtain as much relevant data as possible for this evaluation. It is important to note that the conclusions that lead to this procedure are derived in large part from the available data. Always take into account that the treatment will also be dependent on availability of resources and existing treatment guidelines, considered by other Pain Management Practitioners as being common knowledge and practice, at the time of the intervention. For Medico-Legal purposes, it is also important to point out that variation in procedural techniques and pharmacological choices are the acceptable norm. The indications, contraindications, technique, and results of the above  procedure should only be interpreted and judged by a Board-Certified Interventional Pain Specialist with extensive familiarity and expertise in the same exact procedure and technique.

## 2024-04-05 NOTE — Progress Notes (Signed)
 PROVIDER NOTE: Interpretation of information contained herein should be left to medically-trained personnel. Specific patient instructions are provided elsewhere under Patient Instructions section of medical record. This document was created in part using STT-dictation technology, any transcriptional errors that may result from this process are unintentional.  Patient: Seth DOXTATER Sr. Type: Established DOB: [birth year] MRN: [id number] PCP: Joshua Cathryne BROCKS, MD (Inactive)  Service: Procedure DOS: 04/05/2024 Setting: Ambulatory Location: Ambulatory outpatient facility Delivery: Face-to-face Provider: Wallie Sherry, MD Specialty: Interventional Pain Management Specialty designation: 09 Location: Outpatient facility Ref. Prov.: No ref. provider found       Interventional Therapy   Procedure: Cluneal Nerve block bilateral   Imaging: Fluoroscopy-guided Non-spinal ([id number]) Anesthesia: Local anesthesia (1-2% Lidocaine ) DOS: 04/05/2024  Performed by: Wallie Sherry, MD  Purpose: Diagnostic/Therapeutic Indications: Cluneal neuropathy Rationale (medical necessity): procedure needed and proper for the diagnosis and/or treatment of Seth Stewart medical symptoms and needs. Cluneal neuropathy NAS-11 Pain score:   Pre-procedure: 4 /10   Post-procedure: 4 /10      Position / Prep / Materials:  Position: Prone  Prep solution: ChloraPrep (2% chlorhexidine gluconate and 70% isopropyl alcohol) Prep Area: Entire posterior lumbosacral area  Materials:  Tray: Block Needle(s):  Type: Spinal  Gauge (G): 22  Length: 3.5-in Qty: One (1) per procedure side.  H&P (Pre-op Assessment):  Seth Stewart is a 83 y.o. (year old), male patient, seen today for interventional treatment. He  has a past surgical history that includes Eye surgery (09/21/2009); Laminectomy (09/21/2002); Colonoscopy (12/06/2014); Ablation; Hernia repair (Right, 01/07/2015); Esophagogastroduodenoscopy (N/A, 10/29/2022); Back surgery;  Esophagogastroduodenoscopy; Esophagogastroduodenoscopy (egd) with propofol  (N/A, 12/04/2022); Cardioversion (N/A, 02/09/2024); and ATRIAL FIBRILLATION ABLATION (N/A, 04/03/2024). Seth Stewart has a current medication list which includes the following prescription(s): acetaminophen , amiodarone , apixaban , vitamin c, colchicine , flaxseed (linseed), lactobacillus, metoprolol  tartrate, multivitamin with minerals, omega-3, pantoprazole , rosuvastatin , tadalafil , and vitamin e, and the following Facility-Administered Medications: dexamethasone , dexamethasone , lidocaine , and ropivacaine  (pf) 2 mg/ml (0.2%). His primarily concern today is the Neck Pain  Initial Vital Signs:  Pulse/HCG Rate: (!) 52  Temp: (!) 97.2 F (36.2 C) Resp: 16 BP: (!) 115/100 SpO2: 99 %  BMI: Estimated body mass index is 24.87 kg/m as calculated from the following:   Height as of 04/03/24: 6' 3 (1.905 m).   Weight as of 04/03/24: 199 lb (90.3 kg).  Risk Assessment: Allergies: Reviewed. He has no known allergies.  Allergy Precautions: None required Coagulopathies: Reviewed. None identified.  Blood-thinner therapy: None at this time Active Infection(s): Reviewed. None identified. Seth Stewart is afebrile  Site Confirmation: Seth Stewart was asked to confirm the procedure and laterality before marking the site Procedure checklist: Completed Consent: Before the procedure and under the influence of no sedative(s), amnesic(s), or anxiolytics, the patient was informed of the treatment options, risks and possible complications. To fulfill our ethical and legal obligations, as recommended by the American Medical Association's Code of Ethics, I have informed the patient of my clinical impression; the nature and purpose of the treatment or procedure; the risks, benefits, and possible complications of the intervention; the alternatives, including doing nothing; the risk(s) and benefit(s) of the alternative treatment(s) or procedure(s); and the  risk(s) and benefit(s) of doing nothing. The patient was provided information about the general risks and possible complications associated with the procedure. These may include, but are not limited to: failure to achieve desired goals, infection, bleeding, organ or nerve damage, allergic reactions, paralysis, and death. In addition, the patient was informed of those risks and complications associated  to the procedure, such as failure to decrease pain; infection; bleeding; organ or nerve damage with subsequent damage to sensory, motor, and/or autonomic systems, resulting in permanent pain, numbness, and/or weakness of one or several areas of the body; allergic reactions; (i.e.: anaphylactic reaction); and/or death. Furthermore, the patient was informed of those risks and complications associated with the medications. These include, but are not limited to: allergic reactions (i.e.: anaphylactic or anaphylactoid reaction(s)); adrenal axis suppression; blood sugar elevation that in diabetics may result in ketoacidosis or comma; water retention that in patients with history of congestive heart failure may result in shortness of breath, pulmonary edema, and decompensation with resultant heart failure; weight gain; swelling or edema; medication-induced neural toxicity; particulate matter embolism and blood vessel occlusion with resultant organ, and/or nervous system infarction; and/or aseptic necrosis of one or more joints. Finally, the patient was informed that Medicine is not an exact science; therefore, there is also the possibility of unforeseen or unpredictable risks and/or possible complications that may result in a catastrophic outcome. The patient indicated having understood very clearly. We have given the patient no guarantees and we have made no promises. Enough time was given to the patient to ask questions, all of which were answered to the patient's satisfaction. Seth Stewart has indicated that he wanted to  continue with the procedure. Attestation: I, the ordering provider, attest that I have discussed with the patient the benefits, risks, side-effects, alternatives, likelihood of achieving goals, and potential problems during recovery for the procedure that I have provided informed consent. Date  Time: 04/05/2024 11:12 AM  Pre-Procedure Preparation:  Monitoring: As per clinic protocol. Respiration, ETCO2, SpO2, BP, heart rate and rhythm monitor placed and checked for adequate function Safety Precautions: Patient was assessed for positional comfort and pressure points before starting the procedure. Time-out: I initiated and conducted the Time-out before starting the procedure, as per protocol. The patient was asked to participate by confirming the accuracy of the Time Out information. Verification of the correct person, site, and procedure were performed and confirmed by me, the nursing staff, and the patient. Time-out conducted as per Joint Commission's Universal Protocol (UP.01.01.01). Time:   Start Time:   hrs.  Description/Narrative of Procedure:          Start Time:   hrs.  Rationale (medical necessity): procedure needed and proper for the diagnosis and/or treatment of the patient's medical symptoms and needs.  The skin over the right and left posterior iliac crest region was prepped with chlorhexidine and draped in a sterile fashion. Using anatomic landmarks and fluoroscopic guidance, a 25-gauge spinal needle was advanced to the region approximately 7-8 cm lateral to midline and 1-2 cm superior to the right and left posterior superior iliac spine (PSIS), consistent with the expected course of the right and left superior cluneal nerve.  After negative aspiration, 10 mL of injectate composed of: 9 mL of 0.2% Ropivacaine , and 1 mL of Decadron  (10 mg/mL) was injected slowly without resistance for the right superior cluneal nerves  After negative aspiration, 10 mL of injectate composed of: 9  mL of 0.2% Ropivacaine , and 1 mL of Decadron  (10 mg/mL) was injected slowly without resistance for the leftsuperior cluneal nerves  The needle was withdrawn and hemostasis achieved. The patient tolerated the procedure well without immediate complications.   Vitals:   04/05/24 1122 04/05/24 1123  BP: (!) 115/100 (!) 149/72  Pulse: (!) 52   Resp: 16   Temp: (!) 97.2 F (36.2 C)  TempSrc: Temporal   SpO2: 99%      End Time:   hrs.  Imaging Guidance (Non-Spinal):          Type of Imaging Technique: Fluoroscopy Guidance (Non-Spinal) Indication(s): Fluoroscopy guidance for needle placement to enhance accuracy in procedures requiring precise needle localization for targeted delivery of medication in or near specific anatomical locations not easily accessible without such real-time imaging assistance. Exposure Time: Please see nurses notes. Contrast: None used. Fluoroscopic Guidance: I was personally present during the use of fluoroscopy. Tunnel Vision Technique used to obtain the best possible view of the target area. Parallax error corrected before commencing the procedure. Direction-depth-direction technique used to introduce the needle under continuous pulsed fluoroscopy. Once target was reached, antero-posterior, oblique, and lateral fluoroscopic projection used confirm needle placement in all planes. Images permanently stored in EMR. Interpretation: No contrast injected. I personally interpreted the imaging intraoperatively. Adequate needle placement confirmed in multiple planes. Permanent images saved into the patient's record.  Post-operative Assessment:  Post-procedure Vital Signs:  Pulse/HCG Rate: (!) 52  Temp: (!) 97.2 F (36.2 C) Resp: 16 BP: (!) 149/72 SpO2: 99 %  EBL: None  Complications: No immediate post-treatment complications observed by team, or reported by patient.  Note: The patient tolerated the entire procedure well. A repeat set of vitals were taken after  the procedure and the patient was kept under observation following institutional policy, for this type of procedure. Post-procedural neurological assessment was performed, showing return to baseline, prior to discharge. The patient was provided with post-procedure discharge instructions, including a section on how to identify potential problems. Should any problems arise concerning this procedure, the patient was given instructions to immediately contact us , at any time, without hesitation. In any case, we plan to contact the patient by telephone for a follow-up status report regarding this interventional procedure.  Comments:  No additional relevant information.  Plan of Care (POC)  Orders:  No orders of the defined types were placed in this encounter.   Medications ordered for procedure: Meds ordered this encounter  Medications   lidocaine  (XYLOCAINE ) 2 % (with pres) injection 400 mg   dexamethasone  (DECADRON ) injection 20 mg   ropivacaine  (PF) 2 mg/mL (0.2%) (NAROPIN ) injection 18 mL   dexamethasone  (DECADRON ) injection 10 mg   Medications administered: Eldrick S. Alben Karolee Shed had no medications administered during this visit.  See the medical record for exact dosing, route, and time of administration.    T11/T12 ESI (hx of L1-L5 decompression) 06/2023    Follow-up plan:   Return in about 5 weeks (around 05/10/2024) for PPE, F2F.     Recent Visits Date Type Provider Dept  03/28/24 Office Visit Marcelino Nurse, MD [facility]  03/06/24 Procedure visit Marcelino Nurse, MD [facility]  02/21/24 Procedure visit Marcelino Nurse, MD [facility]  Showing recent visits within past 90 days and meeting all other requirements Today's Visits Date Type Provider Dept  04/05/24 Procedure visit Marcelino Nurse, MD [facility]  Showing today's visits and meeting all other requirements Future Appointments Date Type Provider Dept  05/11/24 Appointment  Marcelino Nurse, MD [facility]  Showing future appointments within next 90 days and meeting all other requirements   Disposition: Discharge home  Discharge (Date  Time): 04/05/2024; 1200 hrs.   Primary Care Physician: Joshua Cathryne BROCKS, MD (Inactive) Location: [facility] Note by: Nurse Marcelino, MD (TTS technology used. I apologize for any typographical errors that were not detected  and corrected.) Date: 04/05/2024; Time: 11:56 AM  Disclaimer:  Medicine is not an Visual merchandiser. The only guarantee in medicine is that nothing is guaranteed. It is important to note that the decision to proceed with this intervention was based on the information collected from the patient. The Data and conclusions were drawn from the patient's questionnaire, the interview, and the physical examination. Because the information was provided in large part by the patient, it cannot be guaranteed that it has not been purposely or unconsciously manipulated. Every effort has been made to obtain as much relevant data as possible for this evaluation. It is important to note that the conclusions that lead to this procedure are derived in large part from the available data. Always take into account that the treatment will also be dependent on availability of resources and existing treatment guidelines, considered by other Pain Management Practitioners as being common knowledge and practice, at the time of the intervention. For Medico-Legal purposes, it is also important to point out that variation in procedural techniques and pharmacological choices are the acceptable norm. The indications, contraindications, technique, and results of the above procedure should only be interpreted and judged by a Board-Certified Interventional Pain Specialist with extensive familiarity and expertise in the same exact procedure and technique.

## 2024-04-06 ENCOUNTER — Telehealth: Payer: Self-pay

## 2024-04-06 ENCOUNTER — Ambulatory Visit: Admitting: Internal Medicine

## 2024-04-06 NOTE — Telephone Encounter (Signed)
 Post procedure follow up.  Patient states he is doing good.

## 2024-04-10 ENCOUNTER — Ambulatory Visit: Admitting: Student in an Organized Health Care Education/Training Program

## 2024-04-13 ENCOUNTER — Encounter (HOSPITAL_COMMUNITY): Payer: Self-pay | Admitting: Physician Assistant

## 2024-04-13 ENCOUNTER — Encounter: Payer: Self-pay | Admitting: Emergency Medicine

## 2024-04-13 ENCOUNTER — Ambulatory Visit (HOSPITAL_COMMUNITY)
Admission: RE | Admit: 2024-04-13 | Discharge: 2024-04-13 | Disposition: A | Discharge status: Home / Self Care | Source: Ambulatory Visit | Attending: Physician Assistant | Admitting: Physician Assistant

## 2024-04-13 VITALS — BP 136/66 | HR 54 | Ht 75.0 in | Wt 204.6 lb

## 2024-04-13 DIAGNOSIS — D6869 Other thrombophilia: Secondary | ICD-10-CM | POA: Insufficient documentation

## 2024-04-13 DIAGNOSIS — Z79899 Other long term (current) drug therapy: Secondary | ICD-10-CM | POA: Diagnosis not present

## 2024-04-13 DIAGNOSIS — I4819 Other persistent atrial fibrillation: Secondary | ICD-10-CM | POA: Insufficient documentation

## 2024-04-13 DIAGNOSIS — Z5181 Encounter for therapeutic drug level monitoring: Secondary | ICD-10-CM | POA: Diagnosis not present

## 2024-04-13 NOTE — Progress Notes (Signed)
 Primary Care Physician: Joshua Cathryne BROCKS, MD (Inactive) Primary Cardiologist: Deatrice Cage, MD Electrophysiologist: OLE ONEIDA HOLTS, MD  Referring Physician: Dr HOLTS Seth GORMAN Seth Sr. is a 83 y.o. male with a history of HLD, HTN, atrial flutter, atrial fibrillation who presents for follow up in the [facility].  The patient is s/p afib ablation remotely at [facility]. He has been maintained on amiodarone  for rhythm control. He had a DCCV on 02/09/24 and underwent afib ablation with Dr HOLTS on 04/03/24. Patient is on Eliquis  for stroke prevention.    Patient presents today for follow up for atrial fibrillation and amiodarone  monitoring. He reports that he has done well since his ablation. He remains in SR. He denies chest pain or groin issues. No bleeding issues on anticoagulation.   Today, he denies symptoms of palpitations, chest pain, shortness of breath, orthopnea, PND, lower extremity edema, dizziness, presyncope, syncope, snoring, daytime somnolence, bleeding, or neurologic sequela. The patient is tolerating medications without difficulties and is otherwise without complaint today.    Atrial Fibrillation Risk Factors:  he does not have symptoms or diagnosis of sleep apnea. he does not have a history of rheumatic fever.   Atrial Fibrillation Management history:  Previous antiarrhythmic drugs: amiodarone   Previous cardioversions: 02/09/24 Previous ablations: 04/03/24 Anticoagulation history: Eliquis   ROS- All systems are reviewed and negative except as per the HPI above.  Past Medical History:  Diagnosis Date   AF (atrial fibrillation) (HCC)    Back pain    Dysphagia    Dysrhythmia    Hemorrhoids    Hyperlipidemia    Hypertension     Current Outpatient Medications  Medication Sig Dispense Refill   acetaminophen  (TYLENOL ) 500 MG tablet Take 1,000 mg by mouth every 8 (eight) hours as needed for moderate pain (pain score 4-6).      amiodarone  (PACERONE ) 100 MG tablet Take 1 tablet (100 mg total) by mouth daily. 90 tablet 1   apixaban  (ELIQUIS ) 5 MG TABS tablet Take 1 tablet (5 mg total) by mouth 2 (two) times daily. 60 tablet 5   Ascorbic Acid (VITAMIN C) 1000 MG tablet Take 1,000 mg by mouth daily.     Flaxseed, Linseed, (FLAX SEEDS PO) Take 2 Scoops by mouth daily.     Lactobacillus (PROBIOTIC ACIDOPHILUS PO) Take 1 capsule by mouth.     metoprolol  tartrate (LOPRESSOR ) 25 MG tablet TAKE 1 TABLET BY MOUTH TWICE DAILY 180 tablet 1   Multiple Vitamins-Minerals (MULTIVITAMIN WITH MINERALS) tablet Take 1 tablet by mouth daily.     Omega-3 1000 MG CAPS Take 1,000 mg by mouth 2 (two) times daily. otc     pantoprazole  (PROTONIX ) 40 MG tablet Take 1 tablet (40 mg total) by mouth daily. Post procedure medication, does not need refills. 45 tablet 0   rosuvastatin  (CRESTOR ) 5 MG tablet Take 1 tablet (5 mg total) by mouth daily. 90 tablet 1   tadalafil  (CIALIS ) 20 MG tablet Take 20 mg by mouth daily as needed for erectile dysfunction.     vitamin E 400 UNIT capsule Take 400 Units by mouth daily.     colchicine  0.6 MG tablet Take 1 tablet (0.6 mg total) by mouth 2 (two) times daily for 5 days. Does not need refills, post procedure medication. 10 tablet 0   No current facility-administered medications for this encounter.    Physical Exam: BP 136/66   Pulse (!) 54   Ht 6' 3 (1.905 m)  Wt 92.8 kg   BMI 25.57 kg/m   GEN: Well nourished, well developed in no acute distress CARDIAC: Regular rate and rhythm, no murmurs, rubs, gallops RESPIRATORY:  Clear to auscultation without rales, wheezing or rhonchi  ABDOMEN: Soft, non-tender, non-distended EXTREMITIES:  No edema; No deformity   Wt Readings from Last 3 Encounters:  04/13/24 92.8 kg  04/03/24 90.3 kg  03/28/24 90.7 kg     EKG today demonstrates  SB, 1st degree AV block Vent. rate 54 BPM PR interval 230 ms QRS duration 104 ms QT/QTcB 462/438 ms   Echo 02/01/24  demonstrated   1. Left ventricular ejection fraction, by estimation, is 55 to 60%. Left  ventricular ejection fraction by 2D MOD biplane is 57.4 %. The left  ventricle has normal function. The left ventricle has no regional wall  motion abnormalities. Left ventricular diastolic parameters are indeterminate. The average left ventricular global longitudinal strain is -11.7 %. The global longitudinal strain is abnormal.   2. Right ventricular systolic function is normal. The right ventricular  size is normal. There is mildly elevated pulmonary artery systolic  pressure. The estimated right ventricular systolic pressure is 39.3 mmHg.   3. Left atrial size was moderately dilated.   4. The mitral valve is normal in structure. Mild mitral valve  regurgitation. No evidence of mitral stenosis.   5. The aortic valve is normal in structure. There is mild calcification  of the aortic valve. Aortic valve regurgitation is mild. Aortic valve  sclerosis is present, with no evidence of aortic valve stenosis.   6. The inferior vena cava is normal in size with greater than 50%  respiratory variability, suggesting right atrial pressure of 3 mmHg.    CHA2DS2-VASc Score = 3  The patient's score is based upon: CHF History: 0 HTN History: 0 Diabetes History: 0 Stroke History: 0 Vascular Disease History: 1 Age Score: 2 Gender Score: 0       ASSESSMENT AND PLAN: Persistent Atrial Fibrillation/atrial flutter (ICD10:  I48.19) The patient's CHA2DS2-VASc score is 3, indicating a 3.2% annual risk of stroke.   S/p remote flutter ablation at [facility]. S/p afib ablation 04/03/24 Patient appears to be maintaining SR Continue amiodarone  100 mg daily for now. Continue Lopressor  25 mg BID Continue Eliquis  5 mg BID with no missed doses for 3 months post ablation.   Secondary Hypercoagulable State (ICD10:  D68.69) The patient is at significant risk for stroke/thromboembolism based upon his CHA2DS2-VASc Score of 3.   Continue Apixaban  (Eliquis ). No bleeding issues.   High Risk Medication Monitoring (ICD 10: Z79.899) Intervals on ECG acceptable for amiodarone  monitoring.   CAD CAC score 484 No anginal symptoms    Follow up with Suzann Riddle as scheduled.        [facility] [address] [place], [place] [address] [phone]

## 2024-04-17 ENCOUNTER — Encounter: Payer: Self-pay | Admitting: Cardiology

## 2024-04-18 MED ORDER — AMIODARONE HCL 100 MG PO TABS: 100.0000 mg | ORAL_TABLET | Freq: Every day | ORAL | 1 refills | Status: DC

## 2024-05-02 ENCOUNTER — Ambulatory Visit (HOSPITAL_COMMUNITY): Admitting: Physician Assistant

## 2024-05-09 ENCOUNTER — Ambulatory Visit
Attending: Student in an Organized Health Care Education/Training Program | Admitting: Student in an Organized Health Care Education/Training Program

## 2024-05-09 ENCOUNTER — Encounter: Payer: Self-pay | Admitting: Student in an Organized Health Care Education/Training Program

## 2024-05-09 ENCOUNTER — Ambulatory Visit (HOSPITAL_COMMUNITY): Admitting: Physician Assistant

## 2024-05-09 VITALS — BP 168/74 | HR 53 | Temp 97.4°F | Ht 75.0 in | Wt 203.0 lb

## 2024-05-09 DIAGNOSIS — G8929 Other chronic pain: Secondary | ICD-10-CM | POA: Insufficient documentation

## 2024-05-09 DIAGNOSIS — M5481 Occipital neuralgia: Secondary | ICD-10-CM | POA: Insufficient documentation

## 2024-05-09 DIAGNOSIS — G588 Other specified mononeuropathies: Secondary | ICD-10-CM | POA: Insufficient documentation

## 2024-05-09 DIAGNOSIS — M5414 Radiculopathy, thoracic region: Secondary | ICD-10-CM | POA: Diagnosis not present

## 2024-05-09 DIAGNOSIS — M5416 Radiculopathy, lumbar region: Secondary | ICD-10-CM | POA: Diagnosis not present

## 2024-05-09 NOTE — Progress Notes (Signed)
 PROVIDER NOTE: Interpretation of information contained herein should be left to medically-trained personnel. Specific patient instructions are provided elsewhere under Patient Instructions section of medical record. This document was created in part using AI and STT-dictation technology, any transcriptional errors that may result from this process are unintentional.  Patient: Seth Stewart.  Service: E/M   PCP: Joshua Cathryne BROCKS, MD (Inactive)  DOB: 1940-09-27  DOS: 05/09/2024  Provider: Wallie Sherry, MD  MRN: [id number]  Delivery: Face-to-face  Specialty: Interventional Pain Management  Type: Established Patient  Setting: Ambulatory outpatient facility  Specialty designation: 09  Referring Prov.: No ref. provider found  Location: Outpatient office facility       History of present illness (HPI) Mr. DRESHON PROFFIT Stewart., a 83 y.o. year old male, is here today because of his Occipital neuralgia of left side [M54.81]. Seth Stewart primary complain today is Back Pain (lower)  Pertinent problems: Seth Stewart has Lumbar canal stenosis; Cluneal neuropathy; History of lumbar laminectomy; Failed back surgical syndrome; Chronic radicular lumbar pain; and Chronic pain syndrome on their pertinent problem list.  Pain Assessment: Severity of Chronic pain is reported as a 2 /10. Location: Back Lower/denies. Onset: More than a month ago. Quality: Aching. Timing: Intermittent. Modifying factor(s): sitting or laying down. Vitals:  height is 6' 3 (1.905 m) and weight is 203 lb (92.1 kg). His temperature is 97.4 F (36.3 C) (abnormal). His blood pressure is 168/74 (abnormal) and his pulse is 53 (abnormal). His oxygen saturation is 98%.  BMI: Estimated body mass index is 25.37 kg/m as calculated from the following:   Height as of this encounter: 6' 3 (1.905 m).   Weight as of this encounter: 203 lb (92.1 kg).  Last encounter: 03/28/2024. Last procedure: 04/05/2024.  Reason for encounter:    Discussed the use  of AI scribe software for clinical note transcription with the patient, who gave verbal consent to proceed.  History of Present Illness   Seth Stewart is an 83 year old male who presents for a follow-up regarding his lower back pain.  He has experienced gradual improvement in his lower back pain over the past two weeks. The pain is most pronounced when bending over, such as when tying his shoes, which he describes as the hardest daily task due to the pain.  Despite this, he experienced minimal discomfort during a recent trip, even when standing in line for two hours in [place]. The pain in his back has subsided significantly, and he has not experienced any major flare-ups since returning from his trip.  He attributes the improvement to the passage of time, noting that it took about two weeks to notice a gradual improvement.     Post-Procedure Evaluation    Procedure:          Anesthesia, Analgesia, Anxiolysis:  Type: Diagnostic, Greater, Occipital Nerve Block           Region: Posterolateral Cervical Level: Occipital Ridge   Laterality: Left  Anesthesia: Local (1-2% Lidocaine )  Anxiolysis: None  Sedation: None  Guidance: Fluoroscopy           Position: Prone   1. Occipital neuralgia of left side   2. Cluneal neuropathy    NAS-11 Pain score:   Pre-procedure: 4 /10   Post-procedure: 4 /10     Effectiveness:  Initial hour after procedure: 50 %  Subsequent 4-6 hours post-procedure: 60 %  Analgesia past initial 6 hours: 80 %  Ongoing improvement:  Analgesic:  80% Function: Somewhat improved ROM: Somewhat improved    ROS  Constitutional: Denies any fever or chills Gastrointestinal: No reported hemesis, hematochezia, vomiting, or acute GI distress Musculoskeletal: Denies any acute onset joint swelling, redness, loss of ROM, or weakness Neurological: No reported episodes of acute onset apraxia, aphasia, dysarthria, agnosia, amnesia, paralysis, loss of  coordination, or loss of consciousness  Medication Review  Flaxseed (Linseed), Lactobacillus, Omega-3, acetaminophen , amiodarone , apixaban , colchicine , metoprolol  tartrate, multivitamin with minerals, pantoprazole , rosuvastatin , tadalafil , vitamin C, and vitamin E  History Review  Allergy: Seth Stewart has no known allergies. Drug: Seth Stewart  reports no history of drug use. Alcohol:  reports current alcohol use of about 7.0 - 14.0 standard drinks of alcohol per week. Tobacco:  reports that he has never smoked. He has never used smokeless tobacco. Social: Mr. Burdi  reports that he has never smoked. He has never used smokeless tobacco. He reports current alcohol use of about 7.0 - 14.0 standard drinks of alcohol per week. He reports that he does not use drugs. Medical:  has a past medical history of AF (atrial fibrillation) (HCC), Back pain, Dysphagia, Dysrhythmia, Hemorrhoids, Hyperlipidemia, and Hypertension. Surgical: Seth Stewart  has a past surgical history that includes Eye surgery (09/21/2009); Laminectomy (09/21/2002); Colonoscopy (12/06/2014); Ablation; Hernia repair (Right, 01/07/2015); Esophagogastroduodenoscopy (N/A, 10/29/2022); Back surgery; Esophagogastroduodenoscopy; Esophagogastroduodenoscopy (egd) with propofol  (N/A, 12/04/2022); Cardioversion (N/A, 02/09/2024); and ATRIAL FIBRILLATION ABLATION (N/A, 04/03/2024). Family: family history includes Cancer in his mother; Heart attack in his father and paternal uncle; Heart disease in his father.  Laboratory Chemistry Profile   Renal Lab Results  Component Value Date   BUN 11 03/29/2024   CREATININE 0.94 03/29/2024   BCR 12 03/29/2024   GFRAA >60 05/09/2019   GFRNONAA >60 05/09/2019    Hepatic Lab Results  Component Value Date   AST 21 01/03/2024   ALT 16 01/03/2024   ALBUMIN 4.6 01/03/2024   ALKPHOS 47 01/03/2024    Electrolytes Lab Results  Component Value Date   NA 134 03/29/2024   K 4.5 03/29/2024   CL 98 03/29/2024    CALCIUM  9.7 03/29/2024   PHOS 3.3 11/11/2015    Bone No results found for: VD25OH, VD125OH2TOT, CI6874NY7, CI7874NY7, 25OHVITD1, 25OHVITD2, 25OHVITD3, TESTOFREE, TESTOSTERONE  Inflammation (CRP: Acute Phase) (ESR: Chronic Phase) No results found for: CRP, ESRSEDRATE, LATICACIDVEN       Note: Above Lab results reviewed.  Recent Imaging Review  DG PAIN CLINIC C-ARM 1-60 MIN NO REPORT Fluoro was used, but no Radiologist interpretation will be provided.  Please refer to NOTES tab for provider progress note. Note: Reviewed        Physical Exam  Vitals: BP (!) 168/74   Pulse (!) 53   Temp (!) 97.4 F (36.3 C)   Ht 6' 3 (1.905 m)   Wt 203 lb (92.1 kg)   SpO2 98%   BMI 25.37 kg/m  BMI: Estimated body mass index is 25.37 kg/m as calculated from the following:   Height as of this encounter: 6' 3 (1.905 m).   Weight as of this encounter: 203 lb (92.1 kg). Ideal: Ideal body weight: 84.5 kg (186 lb 4.6 oz) Adjusted ideal body weight: 87.5 kg (192 lb 15.6 oz) General appearance: Well nourished, well developed, and well hydrated. In no apparent acute distress Mental status: Alert, oriented x 3 (person, place, & time)       Respiratory: No evidence of acute respiratory distress Eyes: PERLA   Assessment   Diagnosis Status  1.  Occipital neuralgia of left side   2. Cluneal neuropathy   3. Chronic radicular lumbar pain   4. Radicular pain of thoracic region    Controlled Controlled Controlled   Updated Problems: No problems updated.  Plan of Care  Patient overall is doing well after his occipital nerve block and T11-T12 ESI.  He states that his trip to [place] was great and he had little pain during his journey and activities there.  Continue to monitor symptoms, follow-up as needed   Orders:  No orders of the defined types were placed in this encounter.    T11/T12 ESI (hx of L1-L5 decompression) 06/2023     Return for patient will call to  schedule F2F appt prn.    Recent Visits Date Type Provider Dept  04/05/24 Procedure visit Marcelino Nurse, MD [facility]  03/28/24 Office Visit Marcelino Nurse, MD [facility]  03/06/24 Procedure visit Marcelino Nurse, MD [facility]  02/21/24 Procedure visit Marcelino Nurse, MD [facility]  Showing recent visits within past 90 days and meeting all other requirements Today's Visits Date Type Provider Dept  05/09/24 Office Visit Marcelino Nurse, MD [facility]  Showing today's visits and meeting all other requirements Future Appointments No visits were found meeting these conditions. Showing future appointments within next 90 days and meeting all other requirements  I discussed the assessment and treatment plan with the patient. The patient was provided an opportunity to ask questions and all were answered. The patient agreed with the plan and demonstrated an understanding of the instructions.  Patient advised to call back or seek an in-person evaluation if the symptoms or condition worsens.  Duration of encounter: 15 minutes.  Total time on encounter, as per AMA guidelines included both the face-to-face and non-face-to-face time personally spent by the physician and/or other qualified health care professional(s) on the day of the encounter (includes time in activities that require the physician or other qualified health care professional and does not include time in activities normally performed by clinical staff). Physician's time may include the following activities when performed: Preparing to see the patient (e.g., pre-charting review of records, searching for previously ordered imaging, lab work, and nerve conduction tests) Review of prior analgesic pharmacotherapies. Reviewing PMP Interpreting ordered tests (e.g., lab work, imaging, nerve conduction tests) Performing post-procedure evaluations, including interpretation of diagnostic  procedures Obtaining and/or reviewing separately obtained history Performing a medically appropriate examination and/or evaluation Counseling and educating the patient/family/caregiver Ordering medications, tests, or procedures Referring and communicating with other health care professionals (when not separately reported) Documenting clinical information in the electronic or other health record Independently interpreting results (not separately reported) and communicating results to the patient/ family/caregiver Care coordination (not separately reported)  Note by: Nurse Marcelino, MD (TTS and AI technology used. I apologize for any typographical errors that were not detected and corrected.) Date: 05/09/2024; Time: 1:14 PM

## 2024-05-09 NOTE — Progress Notes (Signed)
 Safety precautions to be maintained throughout the outpatient stay will include: orient to surroundings, keep bed in low position, maintain call bell within reach at all times, provide assistance with transfer out of bed and ambulation.

## 2024-05-11 ENCOUNTER — Ambulatory Visit: Admitting: Student in an Organized Health Care Education/Training Program

## 2024-06-01 ENCOUNTER — Ambulatory Visit (INDEPENDENT_AMBULATORY_CARE_PROVIDER_SITE_OTHER)

## 2024-06-01 DIAGNOSIS — Z23 Encounter for immunization: Secondary | ICD-10-CM

## 2024-06-01 NOTE — Progress Notes (Signed)
 Patient is in office today for a nurse visit for Immunization. Patient Injection was given in the  Left deltoid. Patient tolerated injection well.

## 2024-06-09 DIAGNOSIS — H35373 Puckering of macula, bilateral: Secondary | ICD-10-CM | POA: Diagnosis not present

## 2024-06-09 DIAGNOSIS — H5051 Esophoria: Secondary | ICD-10-CM | POA: Diagnosis not present

## 2024-06-09 DIAGNOSIS — H532 Diplopia: Secondary | ICD-10-CM | POA: Diagnosis not present

## 2024-06-09 DIAGNOSIS — Z961 Presence of intraocular lens: Secondary | ICD-10-CM | POA: Diagnosis not present

## 2024-06-21 DIAGNOSIS — D2262 Melanocytic nevi of left upper limb, including shoulder: Secondary | ICD-10-CM | POA: Diagnosis not present

## 2024-06-21 DIAGNOSIS — L821 Other seborrheic keratosis: Secondary | ICD-10-CM | POA: Diagnosis not present

## 2024-06-21 DIAGNOSIS — Z23 Encounter for immunization: Secondary | ICD-10-CM | POA: Diagnosis not present

## 2024-06-21 DIAGNOSIS — D0461 Carcinoma in situ of skin of right upper limb, including shoulder: Secondary | ICD-10-CM | POA: Diagnosis not present

## 2024-06-21 DIAGNOSIS — Z85828 Personal history of other malignant neoplasm of skin: Secondary | ICD-10-CM | POA: Diagnosis not present

## 2024-06-21 DIAGNOSIS — L570 Actinic keratosis: Secondary | ICD-10-CM | POA: Diagnosis not present

## 2024-06-21 DIAGNOSIS — D2272 Melanocytic nevi of left lower limb, including hip: Secondary | ICD-10-CM | POA: Diagnosis not present

## 2024-06-21 DIAGNOSIS — D485 Neoplasm of uncertain behavior of skin: Secondary | ICD-10-CM | POA: Diagnosis not present

## 2024-06-21 DIAGNOSIS — D2261 Melanocytic nevi of right upper limb, including shoulder: Secondary | ICD-10-CM | POA: Diagnosis not present

## 2024-06-21 DIAGNOSIS — D225 Melanocytic nevi of trunk: Secondary | ICD-10-CM | POA: Diagnosis not present

## 2024-06-27 ENCOUNTER — Ambulatory Visit (INDEPENDENT_AMBULATORY_CARE_PROVIDER_SITE_OTHER): Admitting: Family Medicine

## 2024-06-27 VITALS — BP 142/60 | HR 56 | Ht 75.0 in | Wt 207.0 lb

## 2024-06-27 DIAGNOSIS — I480 Paroxysmal atrial fibrillation: Secondary | ICD-10-CM

## 2024-06-27 DIAGNOSIS — D0359 Melanoma in situ of other part of trunk: Secondary | ICD-10-CM | POA: Insufficient documentation

## 2024-06-27 DIAGNOSIS — R1319 Other dysphagia: Secondary | ICD-10-CM

## 2024-06-27 DIAGNOSIS — E782 Mixed hyperlipidemia: Secondary | ICD-10-CM

## 2024-06-27 DIAGNOSIS — I10 Essential (primary) hypertension: Secondary | ICD-10-CM

## 2024-06-27 NOTE — Progress Notes (Signed)
 Established Patient Office Visit  Subjective   Patient ID: Seth Mckey., male    DOB: 10-04-1940  Age: 83 y.o. MRN: [id number]  Chief Complaint  Patient presents with   Establish Care    Patient presents today to establish care. He is doing well and does not have any concerns for today's visit.     Assessment & Plan:   Problem List Items Addressed This Visit       Cardiovascular and Mediastinum   Atrial fibrillation (HCC)     Musculoskeletal and Integument   Melanoma in situ of other part of trunk (HCC)     Other   HLD (hyperlipidemia) - Primary   Other Visit Diagnoses       Esophageal dysphagia         Essential hypertension         Assessment and Plan Assessment & Plan Atrial fibrillation, status post repeat ablation Atrial fibrillation managed with repeat ablation. Asymptomatic with no episodes on home monitoring. Long-term amiodarone  use with plans to discontinue due to side effects. - Continue amiodarone  and metoprolol  until cardiology follow-up. - Follow up with cardiology next week to discuss discontinuation of amiodarone  and metoprolol .  Hypertension Blood pressure approximately 140/60 mmHg, acceptable for age. No antihypertensives other than metoprolol . - Monitor blood pressure at home. - Discuss blood pressure management with cardiologist at next week's appointment.  Chronic back pain, status post laminectomies Chronic back pain managed with periodic injections, effective for three months. Further surgery not recommended. - Continue periodic injections for back pain management. - Use Tylenol  as needed for pain relief.  Gastroesophageal reflux disease (GERD) GERD managed with Tums for symptomatic relief. Recommended switch to Pepcid for longer-lasting effect. Protonix  discontinued due to calcium  absorption concerns. - Use Tums for immediate relief of heartburn symptoms. - Switch to Pepcid for longer-lasting relief if symptoms  persist.  Dyslipidemia Dyslipidemia managed with Crestor . Cholesterol levels within normal range. - Continue Crestor  for cholesterol management.  Melanoma of chest, status post excision, under dermatologic surveillance Melanoma excised, under regular dermatologic surveillance. - Continue regular dermatologic surveillance. - Use sunscreen regularly to prevent further skin damage.    No follow-ups on file.   HPI Discussed the use of AI scribe software for clinical note transcription with the patient, who gave verbal consent to proceed.  History of Present Illness Seth Stewart is an 83 year old male with atrial fibrillation who presents for follow-up after a recent cardiac ablation.  He has a history of atrial fibrillation and underwent a cardiac ablation on April 04, 2024, at [facility]. Since the procedure, his cardiac monitor has not shown any episodes of atrial fibrillation. He is scheduled for a three-month follow-up with his cardiologist next week. He has been on amiodarone  for 12-15 years and hopes to discontinue it due to side effects. He is also taking metoprolol  and Eliquis .  He reports recent home blood pressure readings around 140/80 mmHg. He monitors his blood pressure at home and reports similar readings. He has never been on specific blood pressure medication other than metoprolol .  He has a history of back pain and has undergone two laminectomies. He continues to experience pain and receives injections every three months for relief. He uses Tylenol  as needed for pain management.  He has a history of melanoma and squamous cell carcinoma. He regularly visits a dermatologist for monitoring and treatment. He uses sunscreen and has reduced sun exposure due to back issues.  He reports  a past episode of food impaction in his throat, which led to an endoscopy 11 years ago. He no longer experiences swallowing difficulties. No current episodes of food impaction or swallowing  difficulties. Reports occasional heartburn managed with Tums.  He takes Crestor  for cholesterol management. He also takes over-the-counter multivitamins, omega-3, and vitamin E.     Review of Systems  All other systems reviewed and are negative.     Objective:     BP (!) 142/60   Pulse (!) 56   Ht 6' 3 (1.905 m)   Wt 207 lb (93.9 kg)   SpO2 96%   BMI 25.87 kg/m    Physical Exam Vitals and nursing note reviewed.  Constitutional:      Appearance: Normal appearance.  HENT:     Head: Normocephalic.     Right Ear: External ear normal.     Left Ear: External ear normal.  Eyes:     Conjunctiva/sclera: Conjunctivae normal.  Cardiovascular:     Rate and Rhythm: Normal rate.  Pulmonary:     Effort: Pulmonary effort is normal. No respiratory distress.  Abdominal:     Palpations: Abdomen is soft.  Musculoskeletal:        General: Normal range of motion.  Skin:    General: Skin is warm.  Neurological:     Mental Status: He is alert and oriented to person, place, and time.  Psychiatric:        Mood and Affect: Mood normal.      No results found for any visits on 06/27/24.    The ASCVD Risk score (Arnett DK, et al., 2019) failed to calculate for the following reasons:   The 2019 ASCVD risk score is only valid for ages 62 to 67      Ladoris MARLA Ny, MD

## 2024-06-28 ENCOUNTER — Encounter: Admitting: Family Medicine

## 2024-06-29 ENCOUNTER — Encounter: Payer: Self-pay | Admitting: Family Medicine

## 2024-07-01 ENCOUNTER — Other Ambulatory Visit: Payer: Self-pay | Admitting: Family Medicine

## 2024-07-01 DIAGNOSIS — E782 Mixed hyperlipidemia: Secondary | ICD-10-CM

## 2024-07-03 ENCOUNTER — Encounter: Payer: Self-pay | Admitting: Cardiology

## 2024-07-03 ENCOUNTER — Ambulatory Visit: Attending: Cardiology | Admitting: Cardiology

## 2024-07-03 VITALS — BP 138/62 | HR 50 | Ht 75.0 in | Wt 210.8 lb

## 2024-07-03 DIAGNOSIS — Z5181 Encounter for therapeutic drug level monitoring: Secondary | ICD-10-CM | POA: Diagnosis not present

## 2024-07-03 DIAGNOSIS — I4819 Other persistent atrial fibrillation: Secondary | ICD-10-CM | POA: Insufficient documentation

## 2024-07-03 DIAGNOSIS — Z79899 Other long term (current) drug therapy: Secondary | ICD-10-CM | POA: Insufficient documentation

## 2024-07-03 DIAGNOSIS — D6869 Other thrombophilia: Secondary | ICD-10-CM | POA: Insufficient documentation

## 2024-07-03 NOTE — Patient Instructions (Addendum)
 Medication Instructions:  Your physician recommends the following medication changes.  STOP TAKING: Amiodarone  after your trip   *If you need a refill on your cardiac medications before your next appointment, please call your pharmacy*  Lab Work: No labs ordered today  If you have labs (blood work) drawn today and your tests are completely normal, you will receive your results only by: MyChart Message (if you have MyChart) OR A paper copy in the mail If you have any lab test that is abnormal or we need to change your treatment, we will call you to review the results.  Testing/Procedures: No test ordered today   Follow-Up: At [facility], you and your health needs are our priority.  As part of our continuing mission to provide you with exceptional heart care, our providers are all part of one team.  This team includes your primary Cardiologist (physician) and Advanced Practice Providers or APPs (Physician Assistants and Nurse Practitioners) who all work together to provide you with the care you need, when you need it.  Your next appointment:   6 month(s)  Provider:   Suzann Riddle, NP    We recommend signing up for the patient portal called MyChart.  Sign up information is provided on this After Visit Summary.  MyChart is used to connect with patients for Virtual Visits (Telemedicine).  Patients are able to view lab/test results, encounter notes, upcoming appointments, etc.  Non-urgent messages can be sent to your provider as well.   To learn more about what you can do with MyChart, go to [email or web address].

## 2024-07-03 NOTE — Progress Notes (Signed)
 Electrophysiology Clinic Note    Date:  07/03/2024  Patient ID:  Seth Stewart Sr., DOB [birth year], MRN [id number] PCP:  Kotturi, Vinay K, MD  Cardiologist:  Deatrice Cage, MD  Electrophysiologist:  OLE ONEIDA HOLTS, MD  Electrophysiology APP:  Serene Kopf, NP    Discussed the use of AI scribe software for clinical note transcription with the patient, who gave verbal consent to proceed.   Patient Profile    Chief Complaint: 3 mon AF ablation follow-up  History of Present Illness: Seth LOTTS Sr. is a 83 y.o. male with PMH notable for persis AFib, aflutter, HTN, HLD; seen today for OLE ONEIDA HOLTS, MD for routine electrophysiology follow-up s/p AF Ablation.  He is s/p AF ablation remotely at [facility]. He is s/p AF abalation with redo isolation of pulm veins, posterior wall on 03/2024 by Dr. HOLTS.  He saw PA Fenton 03/2024 for routine 1 month post-ablation appt where he was in sinus rhythm. Amiodarone  100mg  daily was continued at that appt.   On follow-up today, he has not had any AF episodes since ablation. He uses [employer] to monitor heart rhythm. He watch notes his resting pulse is in mid-40s. He denies dizziness, LH, SOB with activity, though is limited d/t back pain.   He continues to take eliquis  BID, no bleeding concerns.  Continues to take 25mg  lopressor  BID, along with 100mg  amio daily.   He has 3 week cruise planned soon.    Arrhythmia/Device History Amiodarone     ROS:  Please see the history of present illness. All other systems are reviewed and otherwise negative.    Physical Exam    VS:  BP 138/62 (BP Location: Left Arm, Patient Position: Sitting, Cuff Size: Normal)   Pulse (!) 50   Ht 6' 3 (1.905 m)   Wt 210 lb 12.8 oz (95.6 kg)   SpO2 99%   BMI 26.35 kg/m  BMI: Body mass index is 26.35 kg/m.     Wt Readings from Last 3 Encounters:  07/03/24 210 lb 12.8 oz (95.6 kg)  06/27/24 207 lb (93.9 kg)  05/09/24 203 lb (92.1 kg)      GEN- The patient is well appearing, alert and oriented x 3 today.   Lungs- Clear to ausculation bilaterally, normal work of breathing.  Heart- Regular rate and rhythm, no murmurs, rubs or gallops Extremities- No peripheral edema, warm, dry   Studies Reviewed   Previous EP, cardiology notes.    EKG is ordered. Personal review of EKG from today shows:    EKG Interpretation Date/Time:  Monday July 03 2024 13:48:16 EDT Ventricular Rate:  50 PR Interval:  260 QRS Duration:  98 QT Interval:  492 QTC Calculation: 448 R Axis:   17  Text Interpretation: Sinus bradycardia with 1st degree A-V block Confirmed by Ezekiah Massie [phone]) on 07/03/2024 1:51:56 PM     Cardiac CT, 03/10/2024 1. There is normal variant pulmonary vein drainage into the left atrium. No pulmonary vein stenosis. 2. Normal left atrial appendage, no left atrial appendage thrombus. No intracardiac mass or thrombus. 3. The esophagus runs in the left atrial midline and is not in the proximity to any of the pulmonary veins. 4. Coronary artery calcium  score is 484, which is 49th percentile for age and sex matched peers.  TTE, 02/01/2024  1. Left ventricular ejection fraction, by estimation, is 55 to 60%. Left ventricular ejection fraction by 2D MOD biplane is 57.4 %. The left ventricle has normal function.  The left ventricle has no regional wall motion abnormalities. Left ventricular diastolic parameters are indeterminate. The average left ventricular global longitudinal strain is -11.7 %. The global longitudinal strain is abnormal.   2. Right ventricular systolic function is normal. The right ventricular size is normal. There is mildly elevated pulmonary artery systolic pressure. The estimated right ventricular systolic pressure is 39.3 mmHg.   3. Left atrial size was moderately dilated.   4. The mitral valve is normal in structure. Mild mitral valve regurgitation. No evidence of mitral stenosis.   5. The aortic valve is  normal in structure. There is mild calcification of the aortic valve. Aortic valve regurgitation is mild. Aortic valve sclerosis is present, with no evidence of aortic valve stenosis.   6. The inferior vena cava is normal in size with greater than 50% respiratory variability, suggesting right atrial pressure of 3 mmHg.    Assessment and Plan     #) persis aFib / aflutter #) amiodarone  monitoring S/p AF ablation remotely at [facility] S/p redo AFib ablation 03/2024 by Dr. Cindie Worsening 1st deg HB on today's EKG compared to prior, asymptomatic Will stop amiodarone  once returns from cruise Continue 25mg  lopressor  BID    #) Hypercoag d/t persis afib CHA2DS2-VASc Score = at least 3 [CHF History: 0, HTN History: 0, Diabetes History: 0, Stroke History: 0, Vascular Disease History: 1, Age Score: 2, Gender Score: 0].  Therefore, the patient's annual risk of stroke is 3.2 %.    Stroke ppx - 5mg  eliquis  BID, appropriately dosed No bleeding concerns         Current medicines are reviewed at length with the patient today.   The patient has concerns regarding his medicines.  The following changes were made today:   STOP amiodarone   Labs/ tests ordered today include:  Orders Placed This Encounter  Procedures   EKG 12-Lead     Disposition: Follow up with Dr. Cindie or EP APP in 6 months   Signed, Ariyel Jeangilles, NP  07/03/24  2:14 PM  Electrophysiology [facility]

## 2024-07-04 NOTE — Telephone Encounter (Signed)
 Requested Prescriptions  Pending Prescriptions Disp Refills   rosuvastatin  (CRESTOR ) 5 MG tablet [Pharmacy Med Name: ROSUVASTATIN  5MG  TABLETS] 90 tablet 1    Sig: TAKE 1 TABLET(5 MG) BY MOUTH DAILY     Cardiovascular:  Antilipid - Statins 2 Failed - 07/04/2024 12:59 PM      Failed - Lipid Panel in normal range within the last 12 months    Cholesterol, Total  Date Value Ref Range Status  01/13/2024 178 100 - 199 mg/dL Final   LDL Chol Calc (NIH)  Date Value Ref Range Status  01/13/2024 84 0 - 99 mg/dL Final   LDL Direct  Date Value Ref Range Status  01/25/2023 121 (H) 0 - 99 mg/dL Final   HDL  Date Value Ref Range Status  01/13/2024 79 >39 mg/dL Final   Triglycerides  Date Value Ref Range Status  01/13/2024 82 0 - 149 mg/dL Final         Passed - Cr in normal range and within 360 days    Creatinine  Date Value Ref Range Status  01/11/2013 0.85 0.60 - 1.30 mg/dL Final   Creatinine, Ser  Date Value Ref Range Status  03/29/2024 0.94 0.76 - 1.27 mg/dL Final         Passed - Patient is not pregnant      Passed - Valid encounter within last 12 months    Recent Outpatient Visits           1 week ago Mixed hyperlipidemia   [facility] Kotturi, Vinay K, MD   5 months ago Trigger finger, right middle finger   [facility] Alvia, Selinda PARAS, MD   5 months ago Mixed hyperlipidemia   [facility] Joshua Cathryne JAYSON, MD

## 2024-07-06 ENCOUNTER — Other Ambulatory Visit: Payer: Self-pay | Admitting: Cardiology

## 2024-08-04 ENCOUNTER — Other Ambulatory Visit: Payer: Self-pay | Admitting: Cardiology

## 2024-08-04 DIAGNOSIS — I4819 Other persistent atrial fibrillation: Secondary | ICD-10-CM

## 2024-08-04 MED ORDER — APIXABAN 5 MG PO TABS: 5.0000 mg | ORAL_TABLET | Freq: Two times a day (BID) | ORAL | 5 refills | Status: AC

## 2024-08-28 ENCOUNTER — Telehealth: Payer: Self-pay | Admitting: *Deleted

## 2024-08-28 DIAGNOSIS — Z7901 Long term (current) use of anticoagulants: Secondary | ICD-10-CM | POA: Insufficient documentation

## 2024-08-28 NOTE — Telephone Encounter (Signed)
 Spoke with patient and he did stop Eliquis  prior to procedure for tomorrow.

## 2024-08-28 NOTE — Progress Notes (Unsigned)
 PROVIDER NOTE: Interpretation of information contained herein should be left to medically-trained personnel. Specific patient instructions are provided elsewhere under Patient Instructions section of medical record. This document was created in part using STT-dictation technology, any transcriptional errors that may result from this process are unintentional.  Patient: Seth Stewart. Type: Established DOB: Jun 22, 1941 MRN: [id number] PCP: Kotturi, Vinay K, MD  Service: Procedure DOS: 08/29/2024 Setting: Ambulatory Location: Ambulatory outpatient facility Delivery: Face-to-face Provider: Eric DELENA Como, MD Specialty: Interventional Pain Management Specialty designation: 09 Location: Outpatient facility Ref. Prov.: Kotturi, Vinay K, MD       Interventional Therapy   Primary Reason for Visit: Interventional Pain Management Treatment. CC: No chief complaint on file.    Procedure:          Anesthesia, Analgesia, Anxiolysis:  Type: Diagnostic Superior Cluneal Nerve Block          Region: Lumbar Level: L1, L2, & L3 Dorsal Rami Level(s) Laterality: Bilateral  Anesthesia: Local (1-2% Lidocaine )  Anxiolysis: None  Sedation: None  Guidance: Fluoroscopy           Position: Prone   1. Chronic anticoagulation (Eliquis )    NAS-11 Pain score:   Pre-procedure:  /10   Post-procedure:  /10     H&P (Pre-op Assessment):  Seth Stewart is a 83 y.o. (year old), male patient, seen today for interventional treatment. He  has a past surgical history that includes Eye surgery (09/21/2009); Laminectomy (09/21/2002); Colonoscopy (12/06/2014); Ablation; Hernia repair (Right, 01/07/2015); Esophagogastroduodenoscopy (N/A, 10/29/2022); Back surgery; Esophagogastroduodenoscopy; Esophagogastroduodenoscopy (egd) with propofol  (N/A, 12/04/2022); Cardioversion (N/A, 02/09/2024); and ATRIAL FIBRILLATION ABLATION (N/A, 04/03/2024). Seth Stewart has a current medication list which includes the following  prescription(s): acetaminophen , apixaban , vitamin c, flaxseed (linseed), lactobacillus, metoprolol  tartrate, multivitamin with minerals, omega-3, rosuvastatin , tadalafil , and vitamin e. His primarily concern today is the No chief complaint on file.  Initial Vital Signs:  Pulse/HCG Rate:    Temp:   Resp:   BP:   SpO2:    BMI: Estimated body mass index is 26.35 kg/m as calculated from the following:   Height as of 07/03/24: 6' 3 (1.905 m).   Weight as of 07/03/24: 210 lb 12.8 oz (95.6 kg).  Risk Assessment: Allergies: Reviewed. He has no known allergies.  Allergy Precautions: None required Coagulopathies: Reviewed. None identified.  Blood-thinner therapy: None at this time Active Infection(s): Reviewed. None identified. Mr. Budney is afebrile  Site Confirmation: Seth Stewart was asked to confirm the procedure and laterality before marking the site Procedure checklist: Completed Consent: Before the procedure and under the influence of no sedative(s), amnesic(s), or anxiolytics, the patient was informed of the treatment options, risks and possible complications. To fulfill our ethical and legal obligations, as recommended by the American Medical Association's Code of Ethics, I have informed the patient of my clinical impression; the nature and purpose of the treatment or procedure; the risks, benefits, and possible complications of the intervention; the alternatives, including doing nothing; the risk(s) and benefit(s) of the alternative treatment(s) or procedure(s); and the risk(s) and benefit(s) of doing nothing. The patient was provided information about the general risks and possible complications associated with the procedure. These may include, but are not limited to: failure to achieve desired goals, infection, bleeding, organ or nerve damage, allergic reactions, paralysis, and death. In addition, the patient was informed of those risks and complications associated to Spine-related  procedures, such as failure to decrease pain; infection (i.e.: Meningitis, epidural or intraspinal abscess); bleeding (i.e.: epidural hematoma, subarachnoid  hemorrhage, or any other type of intraspinal or peri-dural bleeding); organ or nerve damage (i.e.: Any type of peripheral nerve, nerve root, or spinal cord injury) with subsequent damage to sensory, motor, and/or autonomic systems, resulting in permanent pain, numbness, and/or weakness of one or several areas of the body; allergic reactions; (i.e.: anaphylactic reaction); and/or death. Furthermore, the patient was informed of those risks and complications associated with the medications. These include, but are not limited to: allergic reactions (i.e.: anaphylactic or anaphylactoid reaction(s)); adrenal axis suppression; blood sugar elevation that in diabetics may result in ketoacidosis or comma; water retention that in patients with history of congestive heart failure may result in shortness of breath, pulmonary edema, and decompensation with resultant heart failure; weight gain; swelling or edema; medication-induced neural toxicity; particulate matter embolism and blood vessel occlusion with resultant organ, and/or nervous system infarction; and/or aseptic necrosis of one or more joints. Finally, the patient was informed that Medicine is not an exact science; therefore, there is also the possibility of unforeseen or unpredictable risks and/or possible complications that may result in a catastrophic outcome. The patient indicated having understood very clearly. We have given the patient no guarantees and we have made no promises. Enough time was given to the patient to ask questions, all of which were answered to the patient's satisfaction. Seth Stewart has indicated that he wanted to continue with the procedure. Attestation: I, the ordering provider, attest that I have discussed with the patient the benefits, risks, side-effects, alternatives, likelihood of  achieving goals, and potential problems during recovery for the procedure that I have provided informed consent. Date  Time: {CHL [facility] TIME CHOICES:21018001}  Pre-Procedure Preparation:  Monitoring: As per clinic protocol. Respiration, ETCO2, SpO2, BP, heart rate and rhythm monitor placed and checked for adequate function Safety Precautions: Patient was assessed for positional comfort and pressure points before starting the procedure. Time-out: I initiated and conducted the Time-out before starting the procedure, as per protocol. The patient was asked to participate by confirming the accuracy of the Time Out information. Verification of the correct person, site, and procedure were performed and confirmed by me, the nursing staff, and the patient. Time-out conducted as per Joint Commission's Universal Protocol (UP.01.01.01). Time:   Start Time:   hrs.  Description of Procedure:          Target Area: For the superior cluneal nerve block, the target is the area just above the angle formed by the junction of the transverse process and superior articular process of L2, L3, & L4, where the medial branch and dorsal rami of L1, L2, and L3 are located. Approach: Paramedial approach. Area Prepped: Entire Posterior Lumbosacral Region ChloraPrep (2% chlorhexidine gluconate and 70% isopropyl alcohol) Safety Precautions: Aspiration looking for blood return was conducted prior to all injections. At no point did we inject any substances, as a needle was being advanced. No attempts were made at seeking any paresthesias. Safe injection practices and needle disposal techniques used. Medications properly checked for expiration dates. SDV (single dose vial) medications used. Description of the Procedure: Protocol guidelines were followed. The patient was placed in position over the fluoroscopy table. The target area was identified and the area prepped in the usual manner. Skin & deeper tissues infiltrated  with local anesthetic. Appropriate amount of time allowed to pass for local anesthetics to take effect. The procedure needle was introduced through the skin, ipsilateral to the reported pain, and advanced to the target area. Employing the "Medial Branch Technique", the needles  were advanced to the angle made by the superior and medial portion of the transverse process, and the lateral and inferior portion of the superior articulating process of the targeted vertebral bodies. This area is known as "Burton's Eye" or the "Eye of the Scottish Dog". Negative aspiration confirmed. Solution injected in intermittent fashion, asking for systemic symptoms every 0.5cc of injectate. The needles were then removed and the area cleansed, making sure to leave some of the prepping solution back to take advantage of its long term bactericidal properties.   Illustration of the posterior view of the lumbar spine and the posterior neural structures. Laminae of L2 through S1 are labeled. DPRL5, dorsal primary ramus of L5; DPRS1, dorsal primary ramus of S1; DPR3, dorsal primary ramus of L3; FJ, facet (zygapophyseal) joint L3-L4; I, inferior articular process of L4; LB1, lateral branch of dorsal primary ramus of L1; IAB, inferior articular branches from L3 medial branch (supplies L4-L5 facet joint); IBP, intermediate branch plexus; MB3, medial branch of dorsal primary ramus of L3; NR3, third lumbar nerve root; S, superior articular process of L5; SAB, superior articular branches from L4 (supplies L4-5 facet joint also); TP3, transverse process of L3.  There were no vitals filed for this visit.  Start Time:   hrs. End Time:   hrs. Materials:  Needle(s) Type: Spinal Needle Gauge: 22G Length: 3.5-in Medication(s): Please see orders for medications and dosing details.  Imaging Guidance (Spinal):         Type of Imaging Technique: Fluoroscopy Guidance (Spinal) Indication(s): Fluoroscopy guidance for needle placement to enhance  accuracy in procedures requiring precise needle localization for targeted delivery of medication in or near specific anatomical locations not easily accessible without such real-time imaging assistance. Exposure Time: Please see nurses notes. Contrast: None used. Fluoroscopic Guidance: I was personally present during the use of fluoroscopy. Tunnel Vision Technique used to obtain the best possible view of the target area. Parallax error corrected before commencing the procedure. Direction-depth-direction technique used to introduce the needle under continuous pulsed fluoroscopy. Once target was reached, antero-posterior, oblique, and lateral fluoroscopic projection used confirm needle placement in all planes. Images permanently stored in EMR. Interpretation: No contrast injected. I personally interpreted the imaging intraoperatively. Adequate needle placement confirmed in multiple planes. Permanent images saved into the patient's record.  Antibiotic Prophylaxis:   Anti-infectives (From admission, onward)    None      Indication(s): None identified  Post-operative Assessment:  Post-procedure Vital Signs:  Pulse/HCG Rate:    Temp:   Resp:   BP:   SpO2:    EBL: None  Complications: No immediate post-treatment complications observed by team, or reported by patient.  Note: The patient tolerated the entire procedure well. A repeat set of vitals were taken after the procedure and the patient was kept under observation following institutional policy, for this type of procedure. Post-procedural neurological assessment was performed, showing return to baseline, prior to discharge. The patient was provided with post-procedure discharge instructions, including a section on how to identify potential problems. Should any problems arise concerning this procedure, the patient was given instructions to immediately contact us , at any time, without hesitation. In any case, we plan to contact the patient  by telephone for a follow-up status report regarding this interventional procedure.  Comments:  No additional relevant information.  Plan of Care (POC)  Orders:  No orders of the defined types were placed in this encounter.   {There is no content from the last Subjective section.}  Medications ordered for procedure: No orders of the defined types were placed in this encounter.  Medications administered: Zethan S. Alben Karolee Shed had no medications administered during this visit.  See the medical record for exact dosing, route, and time of administration.    Interventional Therapies  Risk Factors  Considerations  Medical Comorbidities:  Eliquis  Anticoagulation: (Stop: 3 days  Restart: 6 hours)     Planned  Pending:   Diagnostic/therapeutic bilateral cluneal NB  Diagnostic/therapeutic left occipital NB    Under consideration:   Pending   Completed: (Analgesic benefit)1  None at this time   Therapeutic  Palliative (PRN) options:   None established   Completed by other providers:   None reported  1(Analgesic benefit): Expressed in percentage (%). (Local anesthetic[LA] +/- sedation  L.A.Local Anesthetic  Steroid benefit  Ongoing benefit)    Follow-up plan:   No follow-ups on file.     Recent Visits No visits were found meeting these conditions. Showing recent visits within past 90 days and meeting all other requirements Future Appointments Date Type Provider Dept  08/29/24 Appointment Tanya Glisson, MD [facility]  Showing future appointments within next 90 days and meeting all other requirements   Disposition: Discharge home  Discharge (Date  Time): 08/29/2024;   hrs.   Primary Care Physician: Kotturi, Vinay K, MD Location: [facility] Note by: Glisson DELENA Tanya, MD (TTS technology used. I apologize for any typographical errors that were not detected and corrected.) Date: 08/29/2024; Time: 8:08  AM  Disclaimer:  Medicine is not an visual merchandiser. The only guarantee in medicine is that nothing is guaranteed. It is important to note that the decision to proceed with this intervention was based on the information collected from the patient. The Data and conclusions were drawn from the patient's questionnaire, the interview, and the physical examination. Because the information was provided in large part by the patient, it cannot be guaranteed that it has not been purposely or unconsciously manipulated. Every effort has been made to obtain as much relevant data as possible for this evaluation. It is important to note that the conclusions that lead to this procedure are derived in large part from the available data. Always take into account that the treatment will also be dependent on availability of resources and existing treatment guidelines, considered by other Pain Management Practitioners as being common knowledge and practice, at the time of the intervention. For Medico-Legal purposes, it is also important to point out that variation in procedural techniques and pharmacological choices are the acceptable norm. The indications, contraindications, technique, and results of the above procedure should only be interpreted and judged by a Board-Certified Interventional Pain Specialist with extensive familiarity and expertise in the same exact procedure and technique.

## 2024-08-29 ENCOUNTER — Ambulatory Visit: Admitting: Pain Medicine

## 2024-08-29 ENCOUNTER — Ambulatory Visit
Admission: RE | Admit: 2024-08-29 | Discharge: 2024-08-29 | Disposition: A | Source: Ambulatory Visit | Attending: Pain Medicine | Admitting: Pain Medicine

## 2024-08-29 ENCOUNTER — Ambulatory Visit
Admission: RE | Admit: 2024-08-29 | Discharge: 2024-08-29 | Disposition: A | Attending: Pain Medicine | Admitting: Pain Medicine

## 2024-08-29 VITALS — BP 135/63 | HR 63 | Temp 97.4°F | Resp 16 | Ht 75.0 in | Wt 207.0 lb

## 2024-08-29 DIAGNOSIS — M5459 Other low back pain: Secondary | ICD-10-CM | POA: Diagnosis present

## 2024-08-29 DIAGNOSIS — M47816 Spondylosis without myelopathy or radiculopathy, lumbar region: Secondary | ICD-10-CM | POA: Diagnosis present

## 2024-08-29 DIAGNOSIS — Z9889 Other specified postprocedural states: Secondary | ICD-10-CM

## 2024-08-29 DIAGNOSIS — M48061 Spinal stenosis, lumbar region without neurogenic claudication: Secondary | ICD-10-CM | POA: Insufficient documentation

## 2024-08-29 DIAGNOSIS — G6289 Other specified polyneuropathies: Secondary | ICD-10-CM | POA: Diagnosis not present

## 2024-08-29 DIAGNOSIS — R937 Abnormal findings on diagnostic imaging of other parts of musculoskeletal system: Secondary | ICD-10-CM | POA: Insufficient documentation

## 2024-08-29 DIAGNOSIS — G8929 Other chronic pain: Secondary | ICD-10-CM | POA: Diagnosis present

## 2024-08-29 DIAGNOSIS — M961 Postlaminectomy syndrome, not elsewhere classified: Secondary | ICD-10-CM

## 2024-08-29 DIAGNOSIS — R94130 Abnormal response to nerve stimulation, unspecified: Secondary | ICD-10-CM | POA: Diagnosis not present

## 2024-08-29 DIAGNOSIS — M4316 Spondylolisthesis, lumbar region: Secondary | ICD-10-CM

## 2024-08-29 DIAGNOSIS — Z7901 Long term (current) use of anticoagulants: Secondary | ICD-10-CM

## 2024-08-29 DIAGNOSIS — M545 Low back pain, unspecified: Secondary | ICD-10-CM | POA: Insufficient documentation

## 2024-08-29 DIAGNOSIS — M4185 Other forms of scoliosis, thoracolumbar region: Secondary | ICD-10-CM | POA: Insufficient documentation

## 2024-08-29 DIAGNOSIS — G608 Other hereditary and idiopathic neuropathies: Secondary | ICD-10-CM | POA: Insufficient documentation

## 2024-08-29 DIAGNOSIS — D0461 Carcinoma in situ of skin of right upper limb, including shoulder: Secondary | ICD-10-CM | POA: Diagnosis not present

## 2024-08-29 DIAGNOSIS — M4310 Spondylolisthesis, site unspecified: Secondary | ICD-10-CM | POA: Insufficient documentation

## 2024-08-29 NOTE — Patient Instructions (Addendum)
 GENERAL RISKS AND COMPLICATIONS  What are the risk, side effects and possible complications? Generally speaking, most procedures are safe.  However, with any procedure there are risks, side effects, and the possibility of complications.  The risks and complications are dependent upon the sites that are lesioned, or the type of nerve block to be performed.  The closer the procedure is to the spine, the more serious the risks are.  Great care is taken when placing the radio frequency needles, block needles or lesioning probes, but sometimes complications can occur. Infection: Any time there is an injection through the skin, there is a risk of infection.  This is why sterile conditions are used for these blocks.  There are four possible types of infection. Localized skin infection. Central Nervous System Infection-This can be in the form of Meningitis, which can be deadly. Epidural Infections-This can be in the form of an epidural abscess, which can cause pressure inside of the spine, causing compression of the spinal cord with subsequent paralysis. This would require an emergency surgery to decompress, and there are no guarantees that the patient would recover from the paralysis. Discitis-This is an infection of the intervertebral discs.  It occurs in about 1% of discography procedures.  It is difficult to treat and it may lead to surgery.        2. Pain: the needles have to go through skin and soft tissues, will cause soreness.       3. Damage to internal structures:  The nerves to be lesioned may be near blood vessels or    other nerves which can be potentially damaged.       4. Bleeding: Bleeding is more common if the patient is taking blood thinners such as  aspirin, Coumadin, Ticiid, Plavix, etc., or if he/she have some genetic predisposition  such as hemophilia. Bleeding into the spinal canal can cause compression of the spinal  cord with subsequent paralysis.  This would require an emergency  surgery to  decompress and there are no guarantees that the patient would recover from the  paralysis.       5. Pneumothorax:  Puncturing of a lung is a possibility, every time a needle is introduced in  the area of the chest or upper back.  Pneumothorax refers to free air around the  collapsed lung(s), inside of the thoracic cavity (chest cavity).  Another two possible  complications related to a similar event would include: Hemothorax and Chylothorax.   These are variations of the Pneumothorax, where instead of air around the collapsed  lung(s), you may have blood or chyle, respectively.       6. Spinal headaches: They may occur with any procedures in the area of the spine.       7. Persistent CSF (Cerebro-Spinal Fluid) leakage: This is a rare problem, but may occur  with prolonged intrathecal or epidural catheters either due to the formation of a fistulous  track or a dural tear.       8. Nerve damage: By working so close to the spinal cord, there is always a possibility of  nerve damage, which could be as serious as a permanent spinal cord injury with  paralysis.       9. Death:  Although rare, severe deadly allergic reactions known as Anaphylactic  reaction can occur to any of the medications used.      10. Worsening of the symptoms:  We can always make thing worse.  What are the chances  of something like this happening? Chances of any of this occuring are extremely low.  By statistics, you have more of a chance of getting killed in a motor vehicle accident: while driving to the hospital than any of the above occurring .  Nevertheless, you should be aware that they are possibilities.  In general, it is similar to taking a shower.  Everybody knows that you can slip, hit your head and get killed.  Does that mean that you should not shower again?  Nevertheless always keep in mind that statistics do not mean anything if you happen to be on the wrong side of them.  Even if a procedure has a 1 (one) in a  1,000,000 (million) chance of going wrong, it you happen to be that one..Also, keep in mind that by statistics, you have more of a chance of having something go wrong when taking medications.  Who should not have this procedure? If you are on a blood thinning medication (e.g. Coumadin, Plavix, see list of Blood Thinners), or if you have an active infection going on, you should not have the procedure.  If you are taking any blood thinners, please inform your physician.  How should I prepare for this procedure? Do not eat or drink anything at least six hours prior to the procedure. Bring a driver with you .  It cannot be a taxi. Come accompanied by an adult that can drive you back, and that is strong enough to help you if your legs get weak or numb from the local anesthetic. Take all of your medicines the morning of the procedure with just enough water to swallow them. If you have diabetes, make sure that you are scheduled to have your procedure done first thing in the morning, whenever possible. If you have diabetes, take only half of your insulin dose and notify our nurse that you have done so as soon as you arrive at the clinic. If you are diabetic, but only take blood sugar pills (oral hypoglycemic), then do not take them on the morning of your procedure.  You may take them after you have had the procedure. Do not take aspirin or any aspirin-containing medications, at least eleven (11) days prior to the procedure.  They may prolong bleeding. Wear loose fitting clothing that may be easy to take off and that you would not mind if it got stained with Betadine or blood. Do not wear any jewelry or perfume Remove any nail coloring.  It will interfere with some of our monitoring equipment.  NOTE: Remember that this is not meant to be interpreted as a complete list of all possible complications.  Unforeseen problems may occur.  BLOOD THINNERS The following drugs contain aspirin or other products,  which can cause increased bleeding during surgery and should not be taken for 2 weeks prior to and 1 week after surgery.  If you should need take something for relief of minor pain, you may take acetaminophen  which is found in Tylenol ,m Datril, Anacin-3 and Panadol. It is not blood thinner. The products listed below are.  Do not take any of the products listed below in addition to any listed on your instruction sheet.  A.P.C or A.P.C with Codeine  Codeine  Phosphate Capsules #3 Ibuprofen Ridaura  ABC compound Congesprin Imuran rimadil  Advil Cope Indocin Robaxisal  Alka-Seltzer Effervescent Pain Reliever and Antacid Coricidin or Coricidin-D  Indomethacin Rufen  Alka-Seltzer plus Cold Medicine Cosprin Ketoprofen S-A-C Tablets  Anacin Analgesic Tablets or Capsules Coumadin  Korlgesic Salflex  Anacin Extra Strength Analgesic tablets or capsules CP-2 Tablets Lanoril Salicylate  Anaprox  Cuprimine Capsules Levenox Salocol  Anexsia-D Dalteparin Magan Salsalate  Anodynos Darvon compound Magnesium Salicylate Sine-off  Ansaid Dasin Capsules Magsal Sodium Salicylate  Anturane Depen Capsules Marnal Soma  APF Arthritis pain formula Dewitt's Pills Measurin Stanback  Argesic Dia-Gesic Meclofenamic Sulfinpyrazone  Arthritis Bayer Timed Release Aspirin Diclofenac  Meclomen Sulindac  Arthritis pain formula Anacin Dicumarol Medipren Supac  Analgesic (Safety coated) Arthralgen Diffunasal Mefanamic Suprofen  Arthritis Strength Bufferin Dihydrocodeine Mepro Compound Suprol  Arthropan liquid Dopirydamole Methcarbomol with Aspirin Synalgos  ASA tablets/Enseals Disalcid Micrainin Tagament  Ascriptin Doan's Midol Talwin  Ascriptin A/D Dolene Mobidin Tanderil  Ascriptin Extra Strength Dolobid Moblgesic Ticlid  Ascriptin with Codeine  Doloprin or Doloprin with Codeine  Momentum Tolectin  Asperbuf Duoprin Mono-gesic Trendar  Aspergum Duradyne Motrin or Motrin IB Triminicin  Aspirin plain, buffered or enteric coated  Durasal Myochrisine Trigesic  Aspirin Suppositories Easprin Nalfon Trillsate  Aspirin with Codeine  Ecotrin Regular or Extra Strength Naprosyn  Uracel  Atromid-S Efficin Naproxen  Ursinus  Auranofin Capsules Elmiron Neocylate Vanquish  Axotal Emagrin Norgesic Verin  Azathioprine Empirin or Empirin with Codeine  Normiflo Vitamin E  Azolid Emprazil Nuprin Voltaren   Bayer Aspirin plain, buffered or children's or timed BC Tablets or powders Encaprin Orgaran Warfarin Sodium  Buff-a-Comp Enoxaparin Orudis Zorpin  Buff-a-Comp with Codeine  Equegesic Os-Cal-Gesic   Buffaprin Excedrin plain, buffered or Extra Strength Oxalid   Bufferin Arthritis Strength Feldene Oxphenbutazone   Bufferin plain or Extra Strength Feldene Capsules Oxycodone with Aspirin   Bufferin with Codeine  Fenoprofen Fenoprofen Pabalate or Pabalate-SF   Buffets II Flogesic Panagesic   Buffinol plain or Extra Strength Florinal or Florinal with Codeine  Panwarfarin   Buf-Tabs Flurbiprofen Penicillamine   Butalbital Compound Four-way cold tablets Penicillin   Butazolidin Fragmin Pepto-Bismol   Carbenicillin Geminisyn Percodan   Carna Arthritis Reliever Geopen Persantine   Carprofen Gold's salt Persistin   Chloramphenicol Goody's Phenylbutazone   Chloromycetin Haltrain Piroxlcam   Clmetidine heparin  Plaquenil   Cllnoril Hyco-pap Ponstel   Clofibrate Hydroxy chloroquine Propoxyphen         Before stopping any of these medications, be sure to consult the physician who ordered them.  Some, such as Coumadin (Warfarin) are ordered to prevent or treat serious conditions such as deep thrombosis, pumonary embolisms, and other heart problems.  The amount of time that you may need off of the medication may also vary with the medication and the reason for which you were taking it.  If you are taking any of these medications, please make sure you notify your pain physician before you undergo any procedures.         Facet Blocks Patient  Information  Description: The facets are joints in the spine between the vertebrae.  Like any joints in the body, facets can become irritated and painful.  Arthritis can also effect the facets.  By injecting steroids and local anesthetic in and around these joints, we can temporarily block the nerve supply to them.  Steroids act directly on irritated nerves and tissues to reduce selling and inflammation which often leads to decreased pain.  Facet blocks may be done anywhere along the spine from the neck to the low back depending upon the location of your pain.   After numbing the skin with local anesthetic (like Novocaine), a small needle is passed onto the facet joints under x-ray guidance.  You may experience a sensation of pressure while this is being done.  The  entire block usually lasts about 15-25 minutes.   Conditions which may be treated by facet blocks:  Low back/buttock pain Neck/shoulder pain Certain types of headaches  Preparation for the injection:  Do not eat any solid food or dairy products within 8 hours of your appointment. You may drink clear liquid up to 3 hours before appointment.  Clear liquids include water, black coffee, juice or soda.  No milk or cream please. You may take your regular medication, including pain medications, with a sip of water before your appointment.  Diabetics should hold regular insulin (if taken separately) and take 1/2 normal NPH dose the morning of the procedure.  Carry some sugar containing items with you to your appointment. A driver must accompany you and be prepared to drive you home after your procedure. Bring all your current medications with you. An IV may be inserted and sedation may be given at the discretion of the physician. A blood pressure cuff, EKG and other monitors will often be applied during the procedure.  Some patients may need to have extra oxygen administered for a short period. You will be asked to provide medical information,  including your allergies and medications, prior to the procedure.  We must know immediately if you are taking blood thinners (like Coumadin/Warfarin) or if you are allergic to IV iodine contrast (dye).  We must know if you could possible be pregnant.  Possible side-effects:  Bleeding from needle site Infection (rare, may require surgery) Nerve injury (rare) Numbness & tingling (temporary) Difficulty urinating (rare, temporary) Spinal headache (a headache worse with upright posture) Light-headedness (temporary) Pain at injection site (serveral days) Decreased blood pressure (rare, temporary) Weakness in arm/leg (temporary) Pressure sensation in back/neck (temporary)   Call if you experience:  Fever/chills associated with headache or increased back/neck pain Headache worsened by an upright position New onset, weakness or numbness of an extremity below the injection site Hives or difficulty breathing (go to the emergency room) Inflammation or drainage at the injection site(s) Severe back/neck pain greater than usual New symptoms which are concerning to you  Please note:  Although the local anesthetic injected can often make your back or neck feel good for several hours after the injection, the pain will likely return. It takes 3-7 days for steroids to work.  You may not notice any pain relief for at least one week.  If effective, we will often do a series of 2-3 injections spaced 3-6 weeks apart to maximally decrease your pain.  After the initial series, you may be a candidate for a more permanent nerve block of the facets.  If you have any questions, please call #[phone] [facility] ______________________________________________________________________    Procedure instructions  Stop blood-thinners  Do not eat or drink fluids (other than water) for 6 hours before your procedure  No water for 2 hours before your procedure  Take your  blood pressure medicine with a sip of water  Arrive 30 minutes before your appointment  If sedation is planned, bring suitable driver. [place], [place], & public transportation are NOT APPROVED)  Carefully read the Preparing for your procedure detailed instructions  If you have questions call us  at [phone]  Procedure appointments are for procedures only.   NO medication refills or new problem evaluations will be done on procedure days.   Only the scheduled, pre-approved procedure and side will be done.   ______________________________________________________________________     ______________________________________________________________________    Preparing for your  procedure  Appointments: If you think you may not be able to keep your appointment, call 24-48 hours in advance to cancel. We need time to make it available to others.  Procedure visits are for procedures only. During your procedure appointment there will be: NO Prescription Refills*. NO medication changes or discussions*. NO discussion of disability issues*. NO unrelated pain problem evaluations*. NO evaluations to order other pain procedures*. *These will be addressed at a separate and distinct evaluation encounter on the provider's evaluation schedule and not during procedure days.  Instructions: Food intake: Avoid eating anything solid for at least 8 hours prior to your procedure. Clear liquid intake: You may take clear liquids such as water up to 2 hours prior to your procedure. (No carbonated drinks. No soda.) Transportation: Unless otherwise stated by your physician, bring a driver. (Driver cannot be a Market Researcher, Pharmacist, Community, or any other form of public transportation.) Morning Medicines: Except for blood thinners, take all of your other morning medications with a sip of water. Make sure to take your heart and blood pressure medicines. If your blood pressure's lower number is above 100, the case will be  rescheduled. Blood thinners: Make sure to stop your blood thinners as instructed.  If you take a blood thinner, but were not instructed to stop it, call our office [phone] and ask to talk to a nurse. Not stopping a blood thinner prior to certain procedures could lead to serious complications. Diabetics on insulin: Notify the staff so that you can be scheduled 1st case in the morning. If your diabetes requires high dose insulin, take only  of your normal insulin dose the morning of the procedure and notify the staff that you have done so. Preventing infections: Shower with an antibacterial soap the morning of your procedure.  Build-up your immune system: Take 1000 mg of Vitamin C with every meal (3 times a day) the day prior to your procedure. Antibiotics: Inform the nursing staff if you are taking any antibiotics or if you have any conditions that may require antibiotics prior to procedures. (Example: recent joint implants)   Pregnancy: If you are pregnant make sure to notify the nursing staff. Not doing so may result in injury to the fetus, including death.  Sickness: If you have a cold, fever, or any active infections, call and cancel or reschedule your procedure. Receiving steroids while having an infection may result in complications. Arrival: You must be in the facility at least 30 minutes prior to your scheduled procedure. Tardiness: Your scheduled time is also the cutoff time. If you do not arrive at least 15 minutes prior to your procedure, you will be rescheduled.  Children: Do not bring any children with you. Make arrangements to keep them home. Dress appropriately: There is always a possibility that your clothing may get soiled. Avoid long dresses. Valuables: Do not bring any jewelry or valuables.  Reasons to call and reschedule or cancel your procedure: (Following these recommendations will minimize the risk of a serious complication.) Surgeries: Avoid having procedures within 2  weeks of any surgery. (Avoid for 2 weeks before or after any surgery). Flu Shots: Avoid having procedures within 2 weeks of a flu shots or . (Avoid for 2 weeks before or after immunizations). Barium: Avoid having a procedure within 7-10 days after having had a radiological study involving the use of radiological contrast. (Myelograms, Barium swallow or enema study). Heart attacks: Avoid any elective procedures or surgeries for the initial 6 months after a Myocardial  Infarction (Heart Attack). Blood thinners: It is imperative that you stop these medications before procedures. Let us  know if you if you take any blood thinner.  Infection: Avoid procedures during or within two weeks of an infection (including chest colds or gastrointestinal problems). Symptoms associated with infections include: Localized redness, fever, chills, night sweats or profuse sweating, burning sensation when voiding, cough, congestion, stuffiness, runny nose, sore throat, diarrhea, nausea, vomiting, cold or Flu symptoms, recent or current infections. It is specially important if the infection is over the area that we intend to treat. Heart and lung problems: Symptoms that may suggest an active cardiopulmonary problem include: cough, chest pain, breathing difficulties or shortness of breath, dizziness, ankle swelling, uncontrolled high or unusually low blood pressure, and/or palpitations. If you are experiencing any of these symptoms, cancel your procedure and contact your primary care physician for an evaluation.  Remember:  Regular Business hours are:  Monday to Thursday 8:00 AM to 4:00 PM  Provider's Schedule: Eric Como, MD:  Procedure days: Tuesday and Thursday 7:30 AM to 4:00 PM  Wallie Sherry, MD:  Procedure days: Monday and Wednesday 7:30 AM to 4:00 PM Last  Updated: 08/31/2023 ______________________________________________________________________      ______________________________________________________________________    General Risks and Possible Complications  Patient Responsibilities: It is important that you read this as it is part of your informed consent. It is our duty to inform you of the risks and possible complications associated with treatments offered to you. It is your responsibility as a patient to read this and to ask questions about anything that is not clear or that you believe was not covered in this document.  Patient's Rights: You have the right to refuse treatment. You also have the right to change your mind, even after initially having agreed to have the treatment done. However, under this last option, if you wait until the last second to change your mind, you may be charged for the materials used up to that point.  Introduction: Medicine is not an visual merchandiser. Everything in Medicine, including the lack of treatment(s), carries the potential for danger, harm, or loss (which is by definition: Risk). In Medicine, a complication is a secondary problem, condition, or disease that can aggravate an already existing one. All treatments carry the risk of possible complications. The fact that a side effects or complications occurs, does not imply that the treatment was conducted incorrectly. It must be clearly understood that these can happen even when everything is done following the highest safety standards.  No treatment: You can choose not to proceed with the proposed treatment alternative. The "PRO(s)" would include: avoiding the risk of complications associated with the therapy. The "CON(s)" would include: not getting any of the treatment benefits. These benefits fall under one of three categories: diagnostic; therapeutic; and/or palliative. Diagnostic benefits include: getting information which can ultimately lead to improvement of the disease or symptom(s). Therapeutic benefits are those associated with the successful  treatment of the disease. Finally, palliative benefits are those related to the decrease of the primary symptoms, without necessarily curing the condition (example: decreasing the pain from a flare-up of a chronic condition, such as incurable terminal cancer).  General Risks and Complications: These are associated to most interventional treatments. They can occur alone, or in combination. They fall under one of the following six (6) categories: no benefit or worsening of symptoms; bleeding; infection; nerve damage; allergic reactions; and/or death. No benefits or worsening of symptoms: In Medicine there are no  guarantees, only probabilities. No healthcare provider can ever guarantee that a medical treatment will work, they can only state the probability that it may. Furthermore, there is always the possibility that the condition may worsen, either directly, or indirectly, as a consequence of the treatment. Bleeding: This is more common if the patient is taking a blood thinner, either prescription or over the counter (example: Goody Powders, Fish oil, Aspirin, Garlic, etc.), or if suffering a condition associated with impaired coagulation (example: Hemophilia, cirrhosis of the liver, low platelet counts, etc.). However, even if you do not have one on these, it can still happen. If you have any of these conditions, or take one of these drugs, make sure to notify your treating physician. Infection: This is more common in patients with a compromised immune system, either due to disease (example: diabetes, cancer, human immunodeficiency virus [HIV], etc.), or due to medications or treatments (example: therapies used to treat cancer and rheumatological diseases). However, even if you do not have one on these, it can still happen. If you have any of these conditions, or take one of these drugs, make sure to notify your treating physician. Nerve Damage: This is more common when the treatment is an invasive one, but it  can also happen with the use of medications, such as those used in the treatment of cancer. The damage can occur to small secondary nerves, or to large primary ones, such as those in the spinal cord and brain. This damage may be temporary or permanent and it may lead to impairments that can range from temporary numbness to permanent paralysis and/or brain death. Allergic Reactions: Any time a substance or material comes in contact with our body, there is the possibility of an allergic reaction. These can range from a mild skin rash (contact dermatitis) to a severe systemic reaction (anaphylactic reaction), which can result in death. Death: In general, any medical intervention can result in death, most of the time due to an unforeseen complication. ______________________________________________________________________      ______________________________________________________________________    Blood Thinners  IMPORTANT NOTICE:  If you take any of these, make sure to notify the nursing staff.  Failure to do so may result in serious injury.  Recommended time intervals to stop and restart blood-thinners, before & after invasive procedures  Generic Name Brand Name Pre-procedure: Stop medication for this amount of time before your procedure: Post-procedure: Wait this amount of time after the procedure before restarting your medication:  Abciximab Reopro 15 days 2 hrs  Alteplase Activase 10 days 10 days  Anagrelide Agrylin    Apixaban  Eliquis  3 days 6 hrs  Cilostazol Pletal 3 days 5 hrs  Clopidogrel Plavix 7-10 days 2 hrs  Dabigatran Pradaxa 5 days 6 hrs  Dalteparin Fragmin 24 hours 4 hrs  Dipyridamole Aggrenox 11days 2 hrs  Edoxaban Lixiana; Savaysa 3 days 2 hrs  Enoxaparin  Lovenox 24 hours 4 hrs  Eptifibatide Integrillin 8 hours 2 hrs  Fondaparinux  Arixtra 72 hours 12 hrs  Hydroxychloroquine Plaquenil 11 days   Prasugrel Effient 7-10 days 6 hrs  Reteplase Retavase 10 days 10 days   Rivaroxaban  Xarelto  3 days 6 hrs  Ticagrelor Brilinta 5-7 days 6 hrs  Ticlopidine Ticlid 10-14 days 2 hrs  Tinzaparin Innohep 24 hours 4 hrs  Tirofiban Aggrastat 8 hours 2 hrs  Warfarin Coumadin 5 days 2 hrs   Other medications with blood-thinning effects  NOTE: Consider stopping these if you have prolonged bleeding despite not taking any of the  above blood thinners. Otherwise ask your provider and this will be decided on a case-by-case basis.  Product indications Generic (Brand) names Note  Cholesterol Lipitor Stop 4 days before procedure  Blood thinner (injectable) Heparin  (LMW or LMWH Heparin ) Stop 24 hours before procedure  Cancer Ibrutinib (Imbruvica) Stop 7 days before procedure  Malaria/Rheumatoid Hydroxychloroquine (Plaquenil) Stop 11 days before procedure  Thrombolytics  10 days before or after procedures   Over-the-counter (OTC) Products with blood-thinning effects  NOTE: Consider stopping these if you have prolonged bleeding despite not taking any of the above blood thinners. Otherwise ask your provider and this will be decided on a case-by-case basis.  Product Common names Stop Time  Aspirin > 325 mg Goody Powders, Excedrin, etc. 11 days  Aspirin <= 81 mg  7 days  Fish oil  4 days  Garlic supplements  7 days  Ginkgo biloba  36 hours  Ginseng  24 hours  NSAIDs Ibuprofen, Naprosyn , etc. 3 days  Vitamin E  4 days   ______________________________________________________________________     ______________________________________________________________________    Steroid injections  Common steroids for injections Triamcinolone : Used by many sports medicine physicians for large joint and bursal injections, often combined with a local anesthetic like lidocaine . A study focusing on coccydynia (tailbone pain) found triamcinolone  was more effective than betamethasone , suggesting it may also be preferable for other localized inflammation conditions. Methylprednisolone : A common  alternative to triamcinolone  that is also a strong anti-inflammatory. It is available in different formulations, with the acetate suspension being the long-acting option for intra-articular injections. Dexamethasone : This is a non-particulate steroid, meaning it has a lower risk of tissue damage compared to particulate steroids like triamcinolone  and methylprednisolone . While less common for this specific use, it is an option for targeted injections.   Considerations for physicians Particulate vs. non-particulate steroids: Triamcinolone  and methylprednisolone  are particulate, meaning they can clump together. Dexamethasone  is non-particulate. Particulate steroids are often preferred for their longer-lasting effects but carry a theoretical higher risk for certain injections (though this is less of a concern in the costochondral joints). Combined injectate: Corticosteroids are typically mixed with a local anesthetic like lidocaine  to provide both immediate pain relief (from the anesthetic) and longer-term inflammation reduction (from the steroid). Imaging guidance: To ensure accurate placement of the needle and medication, physicians may use ultrasound or fluoroscopic guidance for the injection, especially in complex or refractory cases.   Patient guidance Before undergoing a steroid injection, discuss the options with your physician. They will determine the best steroid, dosage, and procedure for your specific case based on factors like: Severity of your condition History of response to other treatments Your overall health status Experience and preference of the physician  Last  Updated: 05/16/2024 ______________________________________________________________________      ______________________________________________________________________  Spondylolisthesis  Spondylolisthesis is a condition that occurs when a vertebra in the spine slips out of place, usually in the lower back. Symptoms can  vary from mild to severe, and a person may have no symptoms.  Some common symptoms include:  Back pain, especially chronic pain  Pain that radiates down the legs  Pain that worsens with exercise  Tightness in the hamstrings  Neck stiffness  Loss of spine flexibility  Weakness in the legs or trouble walking  Numbness and tingling in the groin and/or buttocks   Some causes of spondylolisthesis include: Birth defects. Sudden injury. Abnormal wear on the cartilage and bones, such as arthritis. Bone disease and fractures. Certain sports activities, such as gymnastics, weightlifting, and  football.  A doctor can diagnose spondylolisthesis with a physical exam, X-rays, and possibly a CT scan.    Forward slippage is known as "Anterolisthesis".  Backward slippage is known as "Retrolisthesis".   Pathophysiology of Spondylolisthesis:   Grading Classification of Spondylolisthesis Grade I spondylolisthesis is 1 to 25% slippage, grade II is up to 50% slippage, grade III is up to 75% slippage, and grade IV is 76-100% slippage. If there is more than 100% slippage, it is known as spondyloptosis or grade V spondylolisthesis.       ______________________________________________________________________

## 2024-08-29 NOTE — Progress Notes (Signed)
 Safety precautions to be maintained throughout the outpatient stay will include: orient to surroundings, keep bed in low position, maintain call bell within reach at all times, provide assistance with transfer out of bed and ambulation.

## 2024-09-04 ENCOUNTER — Ambulatory Visit: Admission: RE | Admit: 2024-09-04 | Discharge: 2024-09-04 | Attending: Pain Medicine | Admitting: Pain Medicine

## 2024-09-04 DIAGNOSIS — M5459 Other low back pain: Secondary | ICD-10-CM | POA: Diagnosis present

## 2024-09-04 DIAGNOSIS — M4185 Other forms of scoliosis, thoracolumbar region: Secondary | ICD-10-CM | POA: Diagnosis present

## 2024-09-04 DIAGNOSIS — M4316 Spondylolisthesis, lumbar region: Secondary | ICD-10-CM | POA: Diagnosis present

## 2024-09-04 DIAGNOSIS — G6289 Other specified polyneuropathies: Secondary | ICD-10-CM

## 2024-09-04 DIAGNOSIS — M961 Postlaminectomy syndrome, not elsewhere classified: Secondary | ICD-10-CM

## 2024-09-04 DIAGNOSIS — G608 Other hereditary and idiopathic neuropathies: Secondary | ICD-10-CM | POA: Diagnosis present

## 2024-09-04 DIAGNOSIS — M47816 Spondylosis without myelopathy or radiculopathy, lumbar region: Secondary | ICD-10-CM | POA: Diagnosis present

## 2024-09-04 DIAGNOSIS — R937 Abnormal findings on diagnostic imaging of other parts of musculoskeletal system: Secondary | ICD-10-CM | POA: Diagnosis present

## 2024-09-04 DIAGNOSIS — R94130 Abnormal response to nerve stimulation, unspecified: Secondary | ICD-10-CM | POA: Diagnosis present

## 2024-09-04 DIAGNOSIS — G8929 Other chronic pain: Secondary | ICD-10-CM

## 2024-09-04 DIAGNOSIS — M545 Low back pain, unspecified: Secondary | ICD-10-CM

## 2024-09-04 DIAGNOSIS — M48061 Spinal stenosis, lumbar region without neurogenic claudication: Secondary | ICD-10-CM | POA: Diagnosis present

## 2024-09-04 DIAGNOSIS — M4310 Spondylolisthesis, site unspecified: Secondary | ICD-10-CM | POA: Diagnosis present

## 2024-09-05 ENCOUNTER — Encounter: Payer: Self-pay | Admitting: Pain Medicine

## 2024-09-05 ENCOUNTER — Ambulatory Visit
Admission: RE | Admit: 2024-09-05 | Discharge: 2024-09-05 | Disposition: A | Source: Ambulatory Visit | Attending: Pain Medicine | Admitting: Pain Medicine

## 2024-09-05 ENCOUNTER — Ambulatory Visit: Admitting: Pain Medicine

## 2024-09-05 VITALS — BP 153/71 | HR 102 | Temp 98.2°F | Resp 16 | Ht 75.0 in | Wt 208.0 lb

## 2024-09-05 DIAGNOSIS — M961 Postlaminectomy syndrome, not elsewhere classified: Secondary | ICD-10-CM

## 2024-09-05 DIAGNOSIS — G6289 Other specified polyneuropathies: Secondary | ICD-10-CM | POA: Diagnosis present

## 2024-09-05 DIAGNOSIS — Z7901 Long term (current) use of anticoagulants: Secondary | ICD-10-CM

## 2024-09-05 DIAGNOSIS — M47816 Spondylosis without myelopathy or radiculopathy, lumbar region: Secondary | ICD-10-CM

## 2024-09-05 DIAGNOSIS — R937 Abnormal findings on diagnostic imaging of other parts of musculoskeletal system: Secondary | ICD-10-CM | POA: Diagnosis present

## 2024-09-05 DIAGNOSIS — M4310 Spondylolisthesis, site unspecified: Secondary | ICD-10-CM | POA: Insufficient documentation

## 2024-09-05 DIAGNOSIS — M5459 Other low back pain: Secondary | ICD-10-CM

## 2024-09-05 DIAGNOSIS — M545 Low back pain, unspecified: Secondary | ICD-10-CM

## 2024-09-05 DIAGNOSIS — M4316 Spondylolisthesis, lumbar region: Secondary | ICD-10-CM

## 2024-09-05 DIAGNOSIS — G8929 Other chronic pain: Secondary | ICD-10-CM

## 2024-09-05 MED ORDER — ROPIVACAINE HCL 2 MG/ML IJ SOLN
18.0000 mL | Freq: Once | INTRAMUSCULAR | Status: AC
  Administered 2024-09-05: 14:00:00 18 mL via PERINEURAL
  Filled 2024-09-05: qty 20

## 2024-09-05 MED ORDER — MIDAZOLAM HCL 2 MG/2ML IJ SOLN
INTRAMUSCULAR | Status: AC
  Filled 2024-09-05: qty 2

## 2024-09-05 MED ORDER — PENTAFLUOROPROP-TETRAFLUOROETH EX AERO: INHALATION_SPRAY | Freq: Once | CUTANEOUS | Status: AC

## 2024-09-05 MED ORDER — FENTANYL CITRATE (PF) 100 MCG/2ML IJ SOLN: 25.0000 ug | INTRAMUSCULAR | Status: DC | PRN

## 2024-09-05 MED ORDER — TRIAMCINOLONE ACETONIDE 40 MG/ML IJ SUSP
80.0000 mg | Freq: Once | INTRAMUSCULAR | Status: AC
  Administered 2024-09-05: 14:00:00 80 mg
  Filled 2024-09-05: qty 2

## 2024-09-05 MED ORDER — LIDOCAINE HCL 2 % IJ SOLN
20.0000 mL | Freq: Once | INTRAMUSCULAR | Status: AC
  Administered 2024-09-05: 14:00:00 400 mg
  Filled 2024-09-05: qty 20

## 2024-09-05 MED ORDER — MIDAZOLAM HCL 5 MG/5ML IJ SOLN
0.5000 mg | Freq: Once | INTRAMUSCULAR | Status: AC
  Administered 2024-09-05: 14:00:00 2 mg via INTRAVENOUS

## 2024-09-05 NOTE — Patient Instructions (Signed)
 ______________________________________________________________________    Post-Procedure Discharge Instructions  INSTRUCTIONS Apply ice:  Purpose: This will minimize any swelling and discomfort after procedure.  When: Day of procedure, as soon as you get home. How: Fill a plastic sandwich bag with crushed ice. Cover it with a small towel and apply to injection site. How long: (15 min on, 15 min off) Apply for 15 minutes then remove x 15 minutes.  Repeat sequence on day of procedure, until you go to bed. Apply heat:  Purpose: To treat any soreness and discomfort from the procedure. When: Starting the next day after the procedure. How: Apply heat to procedure site starting the day following the procedure. How long: May continue to repeat daily, until discomfort goes away. Food intake: Start with clear liquids (like water) and advance to regular food, as tolerated.  Physical activities: Keep activities to a minimum for the first 8 hours after the procedure. After that, then as tolerated. Driving: If you have received any sedation, be responsible and do not drive. You are not allowed to drive for 24 hours after having sedation. Blood thinner: (Applies only to those taking blood thinners) You may restart your blood thinner 6 hours after your procedure. Insulin: (Applies only to Diabetic patients taking insulin) As soon as you can eat, you may resume your normal dosing schedule. Infection prevention: Keep procedure site clean and dry. Shower daily and clean area with soap and water.  PAIN DIARY Post-procedure Pain Diary: Extremely important that this be done correctly and accurately. Recorded information will be used to determine the next step in treatment. For the purpose of accuracy, follow these rules: Evaluate only the area treated. Do not report or include pain from an untreated area. For the purpose of this evaluation, ignore all other areas of pain, except for the treated area. After your  procedure, avoid taking a long nap and attempting to complete the pain diary after you wake up. Instead, set your alarm clock to go off every hour, on the hour, for the initial 8 hours after the procedure. Document the duration of the numbing medicine, and the relief you are getting from it. Do not go to sleep and attempt to complete it later. It will not be accurate. If you received sedation, it is likely that you were given a medication that may cause amnesia. Because of this, completing the diary at a later time may cause the information to be inaccurate. This information is needed to plan your care. Follow-up appointment: Keep your post-procedure follow-up evaluation appointment after the procedure (usually 2 weeks for most procedures, 6 weeks for radiofrequencies). DO NOT FORGET to bring you pain diary with you.   EXPECT... (What should I expect to see with my procedure?) From numbing medicine (AKA: Local Anesthetics): Numbness or decrease in pain. You may also experience some weakness, which if present, could last for the duration of the local anesthetic. Onset: Full effect within 15 minutes of injected. Duration: It will depend on the type of local anesthetic used. On the average, 1 to 8 hours.  From steroids (Applies only if steroids were used): Decrease in swelling or inflammation. Once inflammation is improved, relief of the pain will follow. Onset of benefits: Depends on the amount of swelling present. The more swelling, the longer it will take for the benefits to be seen. In some cases, up to 10 days. Duration: Steroids will stay in the system x 2 weeks. Duration of benefits will depend on multiple posibilities including persistent irritating  factors. Side-effects: If present, they may typically last 2 weeks (the duration of the steroids). Frequent: Cramps (if they occur, drink Gatorade and take over-the-counter Magnesium 450-500 mg once to twice a day); water retention with temporary weight  gain; increases in blood sugar; decreased immune system response; increased appetite. Occasional: Facial flushing (red, warm cheeks); mood swings; menstrual changes. Uncommon: Long-term decrease or suppression of natural hormones; bone thinning. (These are more common with higher doses or more frequent use. This is why we prefer that our patients avoid having any injection therapies in other practices.)  Very Rare: Severe mood changes; psychosis; aseptic necrosis. From procedure: Some discomfort is to be expected once the numbing medicine wears off. This should be minimal if ice and heat are applied as instructed.  CALL IF... (When should I call?) You experience numbness and weakness that gets worse with time, as opposed to wearing off. New onset bowel or bladder incontinence. (Applies only to procedures done in the spine)  Emergency Numbers: Durning business hours (Monday - Thursday, 8:00 AM - 4:00 PM) (Friday, 9:00 AM - 12:00 Noon): [phone] After hours: [phone] NOTE: If you are having a problem and are unable connect with, or to talk to a provider, then go to your nearest urgent care or emergency department. If the problem is serious and urgent, please call 911. ______________________________________________________________________     ______________________________________________________________________    Steroid injections  Common steroids for injections Triamcinolone : Used by many sports medicine physicians for large joint and bursal injections, often combined with a local anesthetic like lidocaine . A study focusing on coccydynia (tailbone pain) found triamcinolone  was more effective than betamethasone, suggesting it may also be preferable for other localized inflammation conditions. Methylprednisolone: A common alternative to triamcinolone  that is also a strong anti-inflammatory. It is available in different formulations, with the acetate suspension being the long-acting  option for intra-articular injections. Dexamethasone : This is a non-particulate steroid, meaning it has a lower risk of tissue damage compared to particulate steroids like triamcinolone  and methylprednisolone. While less common for this specific use, it is an option for targeted injections.   Considerations for physicians Particulate vs. non-particulate steroids: Triamcinolone  and methylprednisolone are particulate, meaning they can clump together. Dexamethasone  is non-particulate. Particulate steroids are often preferred for their longer-lasting effects but carry a theoretical higher risk for certain injections (though this is less of a concern in the costochondral joints). Combined injectate: Corticosteroids are typically mixed with a local anesthetic like lidocaine  to provide both immediate pain relief (from the anesthetic) and longer-term inflammation reduction (from the steroid). Imaging guidance: To ensure accurate placement of the needle and medication, physicians may use ultrasound or fluoroscopic guidance for the injection, especially in complex or refractory cases.   Patient guidance Before undergoing a steroid injection, discuss the options with your physician. They will determine the best steroid, dosage, and procedure for your specific case based on factors like: Severity of your condition History of response to other treatments Your overall health status Experience and preference of the physician  Last  Updated: 05/16/2024 ______________________________________________________________________

## 2024-09-05 NOTE — Progress Notes (Signed)
 PROVIDER NOTE: Interpretation of information contained herein should be left to medically-trained personnel. Specific patient instructions are provided elsewhere under Patient Instructions section of medical record. This document was created in part using STT-dictation technology, any transcriptional errors that may result from this process are unintentional.  Patient: Seth MAJANO Sr. Type: Established DOB: 08/19/1941 MRN: [id number] PCP: Kotturi, Vinay K, MD  Service: Procedure DOS: 09/05/2024 Setting: Ambulatory Location: Ambulatory outpatient facility Delivery: Face-to-face Provider: Eric DELENA Como, MD Specialty: Interventional Pain Management Specialty designation: 09 Location: Outpatient facility Ref. Prov.: Como Eric, MD       Interventional Therapy   Type: Lumbar Facet, Medial Branch Block(s) (w/ fluoroscopic mapping) #1  Laterality: Bilateral  Level: L2, L3, L4, L5, and S1 Medial Branch/Dorsal Rami Level(s). Injecting these levels blocks the L3-4, L4-5, and L5-S1 lumbar facet joints. Imaging: Fluoroscopic guidance Spinal ([id number]) Anesthesia: Local anesthesia (1-2% Lidocaine ) Anxiolysis: IV Versed  2.0 mg            Sedation: No Sedation                       DOS: 09/05/2024 Performed by: Eric DELENA Como, MD  Primary Purpose: Diagnostic/Therapeutic Indications: Low back pain severe enough to impact quality of life or function. 1. Chronic low back pain (Bilateral) w/o sciatica   2. Low back pain of over 3 months duration   3. Grade 1 Anterolisthesis (10 mm) of lumbar spine (L4/L5)   4. Grade 1 Retrolisthesis (3 mm) of L2/L3   5. Lumbar facet arthropathy (Multilevel) (Bilateral)   6. Lumbar facet hypertrophy (Multilevel) (Bilateral)   7. Lumbar facet joint pain   8. Mechanical low back pain   9. Failed back surgical syndrome (x2)   10. Abnormal MRI, lumbar spine (10/16/2020)   11. Spondylosis without myelopathy or radiculopathy, lumbar region   12.  Motor polyneuropathy (Lower Extremities) (Bilateral)   13. Chronic anticoagulation (Eliquis )    NAS-11 Pain score:   Pre-procedure: 6 /10   Post-procedure: 0-No pain/10   Note: The patient had his lumbar MRI done yesterday but as of the time of this note the report had not been filed.  The diagnostic x-rays of the lumbar spine with flexion-extension views done on 08/29/2024 demonstrated severe asymmetric disc space narrowing at L3-4, more pronounced on the right side.  There is also grade 1 anterolisthesis of L4 over L5 which increases by approximately 2 mm on flexion images without significantly changing on extension images.  There is facet arthrosis that is present at multiple levels, bilaterally, but most pronounced at the L4-5 and L5-S1 levels.    Position / Prep / Materials:  Position: Prone  Prep solution: ChloraPrep (2% chlorhexidine gluconate and 70% isopropyl alcohol) Area Prepped: Posterolateral Lumbosacral Spine (Wide prep: From the lower border of the scapula down to the end of the tailbone and from flank to flank.)  Materials:  Tray: Block Needle(s):  Type: Spinal  Gauge (G): 22  Length: 5-in Qty: 4     H&P (Pre-op Assessment):  Mr. Seth Stewart is a 83 y.o. (year old), male patient, seen today for interventional treatment. He  has a past surgical history that includes Eye surgery (09/21/2009); Laminectomy (09/21/2002); Colonoscopy (12/06/2014); Ablation; Hernia repair (Right, 01/07/2015); Esophagogastroduodenoscopy (N/A, 10/29/2022); Back surgery; Esophagogastroduodenoscopy; Esophagogastroduodenoscopy (egd) with propofol  (N/A, 12/04/2022); Cardioversion (N/A, 02/09/2024); and ATRIAL FIBRILLATION ABLATION (N/A, 04/03/2024). Mr. Seth Stewart has a current medication list which includes the following prescription(s): acetaminophen , apixaban , vitamin c, flaxseed (linseed), lactobacillus, metoprolol  tartrate, multivitamin  with minerals, omega-3, rosuvastatin , tadalafil , and vitamin e, and the  following Facility-Administered Medications: fentanyl . His primarily concern today is the Back Pain (lower)  Initial Vital Signs:  Pulse/HCG Rate: (!) 102ECG Heart Rate: (!) 58 Temp: 98.2 F (36.8 C) Resp: 16 BP: 119/74 SpO2: 96 %  BMI: Estimated body mass index is 26 kg/m as calculated from the following:   Height as of this encounter: 6' 3 (1.905 m).   Weight as of this encounter: 208 lb (94.3 kg).  Risk Assessment: Allergies: Reviewed. He has no known allergies.  Allergy Precautions: None required Coagulopathies: Reviewed. None identified.  Blood-thinner therapy: None at this time Active Infection(s): Reviewed. None identified. Mr. Seth Stewart is afebrile  Site Confirmation: Mr. Seth Stewart was asked to confirm the procedure and laterality before marking the site Procedure checklist: Completed Consent: Before the procedure and under the influence of no sedative(s), amnesic(s), or anxiolytics, the patient was informed of the treatment options, risks and possible complications. To fulfill our ethical and legal obligations, as recommended by the American Medical Association's Code of Ethics, I have informed the patient of my clinical impression; the nature and purpose of the treatment or procedure; the risks, benefits, and possible complications of the intervention; the alternatives, including doing nothing; the risk(s) and benefit(s) of the alternative treatment(s) or procedure(s); and the risk(s) and benefit(s) of doing nothing. The patient was provided information about the general risks and possible complications associated with the procedure. These may include, but are not limited to: failure to achieve desired goals, infection, bleeding, organ or nerve damage, allergic reactions, paralysis, and death. In addition, the patient was informed of those risks and complications associated to Spine-related procedures, such as failure to decrease pain; infection (i.e.: Meningitis, epidural or  intraspinal abscess); bleeding (i.e.: epidural hematoma, subarachnoid hemorrhage, or any other type of intraspinal or peri-dural bleeding); organ or nerve damage (i.e.: Any type of peripheral nerve, nerve root, or spinal cord injury) with subsequent damage to sensory, motor, and/or autonomic systems, resulting in permanent pain, numbness, and/or weakness of one or several areas of the body; allergic reactions; (i.e.: anaphylactic reaction); and/or death. Furthermore, the patient was informed of those risks and complications associated with the medications. These include, but are not limited to: allergic reactions (i.e.: anaphylactic or anaphylactoid reaction(s)); adrenal axis suppression; blood sugar elevation that in diabetics may result in ketoacidosis or comma; water retention that in patients with history of congestive heart failure may result in shortness of breath, pulmonary edema, and decompensation with resultant heart failure; weight gain; swelling or edema; medication-induced neural toxicity; particulate matter embolism and blood vessel occlusion with resultant organ, and/or nervous system infarction; and/or aseptic necrosis of one or more joints. Finally, the patient was informed that Medicine is not an exact science; therefore, there is also the possibility of unforeseen or unpredictable risks and/or possible complications that may result in a catastrophic outcome. The patient indicated having understood very clearly. We have given the patient no guarantees and we have made no promises. Enough time was given to the patient to ask questions, all of which were answered to the patient's satisfaction. Mr. Seth Stewart has indicated that he wanted to continue with the procedure. Attestation: I, the ordering provider, attest that I have discussed with the patient the benefits, risks, side-effects, alternatives, likelihood of achieving goals, and potential problems during recovery for the procedure that I have  provided informed consent. Date  Time: 09/05/2024  1:10 PM  Pre-Procedure Preparation:  Monitoring: As per clinic protocol. Respiration,  ETCO2, SpO2, BP, heart rate and rhythm monitor placed and checked for adequate function Safety Precautions: Patient was assessed for positional comfort and pressure points before starting the procedure. Time-out: I initiated and conducted the Time-out before starting the procedure, as per protocol. The patient was asked to participate by confirming the accuracy of the Time Out information. Verification of the correct person, site, and procedure were performed and confirmed by me, the nursing staff, and the patient. Time-out conducted as per Joint Commission's Universal Protocol (UP.01.01.01). Time: 1340 Start Time: 1341 hrs.  Description of Procedure:          Laterality: (see above) Targeted Levels: (see above)  Safety Precautions: Aspiration looking for blood return was conducted prior to all injections. At no point did we inject any substances, as a needle was being advanced. Before injecting, the patient was told to immediately notify me if he was experiencing any new onset of ringing in the ears, or metallic taste in the mouth. No attempts were made at seeking any paresthesias. Safe injection practices and needle disposal techniques used. Medications properly checked for expiration dates. SDV (single dose vial) medications used. After the completion of the procedure, all disposable equipment used was discarded in the proper designated medical waste containers. Local Anesthesia: Protocol guidelines were followed. The patient was positioned over the fluoroscopy table. The area was prepped in the usual manner. The time-out was completed. The target area was identified using fluoroscopy. A 12-in long, straight, sterile hemostat was used with fluoroscopic guidance to locate the targets for each level blocked. Once located, the skin was marked with an  approved surgical skin marker. Once all sites were marked, the skin (epidermis, dermis, and hypodermis), as well as deeper tissues (fat, connective tissue and muscle) were infiltrated with a small amount of a short-acting local anesthetic, loaded on a 10cc syringe with a 25G, 1.5-in  Needle. An appropriate amount of time was allowed for local anesthetics to take effect before proceeding to the next step. Local Anesthetic: Lidocaine  2.0% The unused portion of the local anesthetic was discarded in the proper designated containers. Technical description of process:  Medial Branch  Dorsal Rami Nerve Block (MBB):  Neuroanatomy note: Each lumbar facet joint receives dual innervation from medial branches arising from the posterior primary rami at the same level and one level above. The target for each lumbar medial branch is the junction of the ipsilateral superior articular and transverse process of the lower vertebral body. (i.e.: The L4-L5 facet joint is innervated by the L4 medial branch [located at L5] and the L3 medial branch [located at L4]. Blocking the L4 Medial Branch is therefore achieved by injecting at the junction of the ipsilateral superior articular and transverse process of the lower vertebral body [L5].).  Exception: The exception to the above rule is the L5-S1 facet joint which has triple innervation requiring the L4 medial branch, as well as the L5 and the S1 Dorsal Rami(s) to be blocked to fully denervate the joint.  Under fluoroscopic guidance, a needle was inserted until contact was made with os over the target area. After negative aspiration, 0.5 mL of the nerve block solution was injected without difficulty or complication. Paresthesia were avoided during injection. The needle(s) were removed intact and without complication.  Once the entire procedure was completed, the treated area was cleaned, making sure to leave some of the prepping solution back to take advantage of its long term  bactericidal properties.  Illustration of the posterior view of the lumbar spine and the posterior neural structures. Laminae of L2 through S1 are labeled. DPRL5, dorsal primary ramus of L5; DPRS1, dorsal primary ramus of S1; DPR3, dorsal primary ramus of L3; FJ, facet (zygapophyseal) joint L3-L4; I, inferior articular process of L4; LB1, lateral branch of dorsal primary ramus of L1; IAB, inferior articular branches from L3 medial branch (supplies L4-L5 facet joint); IBP, intermediate branch plexus; MB3, medial branch of dorsal primary ramus of L3; NR3, third lumbar nerve root; S, superior articular process of L5; SAB, superior articular branches from L4 (supplies L4-5 facet joint also); TP3, transverse process of L3.   Facet Joint Innervation (* possible contribution)  L1-2 T12, L1 (L2*)  Medial Branch  L2-3 L1, L2 (L3*)                     L3-4 L2, L3 (L4*)                     L4-5 L3, L4 (L5*)                     L5-S1 L4, L5, S1                        Vitals:   09/05/24 1345 09/05/24 1350 09/05/24 1353 09/05/24 1400  BP: 135/79 (!) 140/79 (!) 144/85 (!) 153/71  Pulse:      Resp: 15 16 17 16   Temp:      TempSrc:      SpO2: 99% (!) 62% 98% 99%  Weight:      Height:         End Time: 1352 hrs.  Imaging Guidance (Spinal):         Type of Imaging Technique: Fluoroscopy Guidance (Spinal) Indication(s): Fluoroscopy guidance for needle placement to enhance accuracy in procedures requiring precise needle localization for targeted delivery of medication in or near specific anatomical locations not easily accessible without such real-time imaging assistance. Exposure Time: Please see nurses notes. Contrast: None used. Fluoroscopic Guidance: I was personally present during the use of fluoroscopy. Tunnel Vision Technique used to obtain the best possible view of the target area. Parallax error corrected before commencing the procedure. Direction-depth-direction technique  used to introduce the needle under continuous pulsed fluoroscopy. Once target was reached, antero-posterior, oblique, and lateral fluoroscopic projection used confirm needle placement in all planes. Images permanently stored in EMR. Interpretation: No contrast injected. I personally interpreted the imaging intraoperatively. Adequate needle placement confirmed in multiple planes. Permanent images saved into the patient's record.  Post-operative Assessment:  Post-procedure Vital Signs:  Pulse/HCG Rate: (!) 102(!) 48 Temp: 98.2 F (36.8 C) Resp: 16 BP: (!) 153/71 SpO2: 99 %  EBL: None  Complications: No immediate post-treatment complications observed by team, or reported by patient.  Note: The patient tolerated the entire procedure well. A repeat set of vitals were taken after the procedure and the patient was kept under observation following institutional policy, for this type of procedure. Post-procedural neurological assessment was performed, showing return to baseline, prior to discharge. The patient was provided with post-procedure discharge instructions, including a section on how to identify potential problems. Should any problems arise concerning this procedure, the patient was given instructions to immediately contact us , at any time, without hesitation. In any case, we plan to contact the patient by telephone for a follow-up status report regarding this interventional procedure.  Comments:  No  additional relevant information.  Plan of Care (POC)  Orders:  Orders Placed This Encounter  Procedures   LUMBAR FACET(MEDIAL BRANCH NERVE BLOCK) MBNB    Scheduling Instructions:     Procedure: Lumbar facet block (AKA.: Lumbosacral medial branch nerve block)     Side: Bilateral     Level: L3-4, L4-5, and L5-S1 Facets (L2, L3, L4, L5, and S1 Medial Branch Nerves)     Sedation: Patient's choice.     Date: 09/05/2024    Where will this procedure be performed?:   [facility]   DG  PAIN CLINIC C-ARM 1-60 MIN NO REPORT    Intraoperative interpretation by procedural physician at [facility].    Standing Status:   Standing    Number of Occurrences:   1    Reason for exam::   Assistance in needle guidance and placement for procedures requiring needle placement in or near specific anatomical locations not easily accessible without such assistance.   Informed Consent Details: Physician/Practitioner Attestation; Transcribe to consent form and obtain patient signature    Nursing Order: Transcribe to consent form and obtain patient signature. Note: Always confirm laterality of pain with Mr. Seth Stewart, before procedure.    Physician/Practitioner attestation of informed consent for procedure/surgical case:   I, the physician/practitioner, attest that I have discussed with the patient the benefits, risks, side effects, alternatives, likelihood of achieving goals and potential problems during recovery for the procedure that I have provided informed consent.    Procedure:   Lumbar Facet Block  under fluoroscopic guidance    Physician/Practitioner performing the procedure:   Ellaree Gear A. Tanya MD    Indication/Reason:   Low Back Pain, with our without leg pain, due to Facet Joint Arthralgia (Joint Pain) Spondylosis (Arthritis of the Spine), without myelopathy or radiculopathy (Nerve Damage).   Care order/instruction: Please confirm that the patient has stopped the Eliquis  (Apixaban ) x 3 days prior to procedure or surgery.    Please confirm that the patient has stopped the Eliquis  (Apixaban ) x 3 days prior to procedure or surgery.    Standing Status:   Standing    Number of Occurrences:   1   Provide equipment / supplies at bedside    Procedure tray: Block Tray (Disposable  single use) Skin infiltration needle: Regular 1.5-in, 25-G, (x1) Block Needle type: Spinal Amount/quantity: 4 Size: Regular (3.5-inch) Gauge: 22G    Standing Status:   Standing    Number of Occurrences:    1    Specify:   Block Tray   Saline lock IV    Have LR [phone] mL available and administer at 125 mL/hr if patient becomes hypotensive.    Standing Status:   Standing    Number of Occurrences:   1   Bleeding precautions    Standing Status:   Standing    Number of Occurrences:   1     Opioid Analgesics: None  MME: 0 mg/day    Medications ordered for procedure: Meds ordered this encounter  Medications   lidocaine  (XYLOCAINE ) 2 % (with pres) injection 400 mg   pentafluoroprop-tetrafluoroeth (GEBAUERS) aerosol   midazolam  (VERSED ) 5 MG/5ML injection 0.5-2 mg    Make sure Flumazenil is available in the pyxis when using this medication. If oversedation occurs, administer 0.2 mg IV over 15 sec. If after 45 sec no response, administer 0.2 mg again over 1 min; may repeat at 1 min intervals; not to exceed 4 doses (1 mg)   fentaNYL  (SUBLIMAZE ) injection  25-50 mcg    Make sure Narcan is available in the pyxis when using this medication. In the event of respiratory depression (RR< 8/min): Titrate NARCAN (naloxone) in increments of 0.1 to 0.2 mg IV at 2-3 minute intervals, until desired degree of reversal.   ropivacaine  (PF) 2 mg/mL (0.2%) (NAROPIN ) injection 18 mL   triamcinolone  acetonide (KENALOG -40) injection 80 mg   Medications administered: We administered lidocaine , pentafluoroprop-tetrafluoroeth, midazolam , ropivacaine  (PF) 2 mg/mL (0.2%), and triamcinolone  acetonide.  See the medical record for exact dosing, route, and time of administration.    Interventional Therapies  Risk Factors  Considerations  Medical Comorbidities:  Eliquis  Anticoagulation: (Stop: 3 days  Restart: 6 hours)  A-Fib        Planned  Pending:   Diagnostic bilateral lumbar facet MBB #1 (09/05/2024)    Under consideration:   Diagnostic bilateral lumbar facet MBB #1  Referral to physical therapy entered (08/29/2024)  MRI of the lumbar spine ordered (08/29/2024)  X-rays of the lumbar spine and  flexion-extension ordered (08/29/2024)    Completed: (Analgesic benefit)1  Diagnostic bilateral cluneal NB x1 (04/05/2024) (unknown)  Diagnostic left greater occipital NB x1 (04/05/2024) (50/60/80/80)  Diagnostic left T11-12 IL thoracic ESI x3 (03/06/2024) (90/50/50/50)    Therapeutic  Palliative (PRN) options:   None established   Completed by other providers:   Therapeutic L5-S1 IL LESI x1 (11/22/2020) by Aloha Sharps, MD ([facility]) note: Patient indicates having developed a spinal block x 6 hours where he was unable to feel or move either lower extremity.  Diagnostic EMG/PNCV (LE) (03/21/2020) by Arthea Farrow, MD [facility]) Dx: Chronic severe sensorimotor polyneuropathy and moderate chronic motor polyneuropathy  Surgery: Bilateral L1-L4 decompressive laminectomies ([facility]) by Maude Lamar Campi, MD (03/27/2015)  Surgery: Left L4-5 and L5-S1 decompressive laminectomies ([facility]) by Gretel Lamar BRAVO, MD (10/30/2002)   1(Analgesic benefit): Expressed in percentage (%). (Local anesthetic[LA] +/- sedation  L.A.Local Anesthetic  Steroid benefit  Ongoing benefit)    Follow-up plan:   Return in about 2 weeks (around 09/19/2024) for (Face2F), (PPE), w/ Dr. Marcelino.     Recent Visits Date Type Provider Dept  08/29/24 Office Visit Tanya Glisson, MD [facility]  Showing recent visits within past 90 days and meeting all other requirements Today's Visits Date Type Provider Dept  09/05/24 Procedure visit Tanya Glisson, MD [facility]  Showing today's visits and meeting all other requirements Future Appointments Date Type Provider Dept  10/10/24 Appointment Marcelino Nurse, MD [facility]  Showing future appointments within next 90 days and meeting all other requirements   Disposition: Discharge home  Discharge (Date  Time): 09/05/2024; 1402 hrs.   Primary Care Physician: Kotturi, Vinay K, MD Location: [facility]  Outpatient [facility] Facility Note by: Glisson DELENA Tanya, MD (TTS technology used. I apologize for any typographical errors that were not detected and corrected.) Date: 09/05/2024; Time: 3:03 PM  Disclaimer:  Medicine is not an visual merchandiser. The only guarantee in medicine is that nothing is guaranteed. It is important to note that the decision to proceed with this intervention was based on the information collected from the patient. The Data and conclusions were drawn from the patient's questionnaire, the interview, and the physical examination. Because the information was provided in large part by the patient, it cannot be guaranteed that it has not been purposely or unconsciously manipulated. Every effort has been made to obtain as much relevant data as possible for this evaluation. It is important to note  that the conclusions that lead to this procedure are derived in large part from the available data. Always take into account that the treatment will also be dependent on availability of resources and existing treatment guidelines, considered by other Pain Management Practitioners as being common knowledge and practice, at the time of the intervention. For Medico-Legal purposes, it is also important to point out that variation in procedural techniques and pharmacological choices are the acceptable norm. The indications, contraindications, technique, and results of the above procedure should only be interpreted and judged by a Board-Certified Interventional Pain Specialist with extensive familiarity and expertise in the same exact procedure and technique.

## 2024-10-10 ENCOUNTER — Encounter: Payer: Self-pay | Admitting: Student in an Organized Health Care Education/Training Program

## 2024-10-10 ENCOUNTER — Ambulatory Visit
Attending: Student in an Organized Health Care Education/Training Program | Admitting: Student in an Organized Health Care Education/Training Program

## 2024-10-10 VITALS — BP 126/67 | HR 60 | Temp 98.8°F | Resp 16 | Ht 75.0 in | Wt 205.0 lb

## 2024-10-10 DIAGNOSIS — M961 Postlaminectomy syndrome, not elsewhere classified: Secondary | ICD-10-CM | POA: Diagnosis not present

## 2024-10-10 DIAGNOSIS — M5459 Other low back pain: Secondary | ICD-10-CM | POA: Insufficient documentation

## 2024-10-10 DIAGNOSIS — M47816 Spondylosis without myelopathy or radiculopathy, lumbar region: Secondary | ICD-10-CM | POA: Diagnosis present

## 2024-10-10 DIAGNOSIS — G8929 Other chronic pain: Secondary | ICD-10-CM | POA: Diagnosis not present

## 2024-10-10 DIAGNOSIS — M545 Low back pain, unspecified: Secondary | ICD-10-CM | POA: Diagnosis not present

## 2024-10-10 NOTE — Progress Notes (Signed)
 Safety precautions to be maintained throughout the outpatient stay will include: orient to surroundings, keep bed in low position, maintain call bell within reach at all times, provide assistance with transfer out of bed and ambulation.

## 2024-10-10 NOTE — Progress Notes (Signed)
 PROVIDER NOTE: Interpretation of information contained herein should be left to medically-trained personnel. Specific patient instructions are provided elsewhere under Patient Instructions section of medical record. This document was created in part using AI and STT-dictation technology, any transcriptional errors that may result from this process are unintentional.  Patient: Seth GORMAN Holm Sr.  Service: E/M   PCP: Kotturi, Vinay K, MD  DOB: 02-Oct-1940  DOS: 10/10/2024  Provider: Wallie Sherry, MD  MRN: [id number]  Delivery: Face-to-face  Specialty: Interventional Pain Management  Type: Established Patient  Setting: Ambulatory outpatient facility  Specialty designation: 09  Referring Prov.: Kotturi, Vinay K, MD  Location: Outpatient office facility       History of present illness (HPI) Mr. Seth TESAR Sr., a 84 y.o. year old male, is here today because of his Chronic bilateral low back pain without sciatica [M54.50, G89.29]. Mr. Milliron primary complain today is Back Pain (lower)  Pertinent problems: Mr. Sondgeroth has Lumbar central spinal stenosis, w/o neurogenic claudication; Cluneal neuropathy; History of lumbar laminectomy; Failed back surgical syndrome (x2); Chronic radicular lumbar pain; and Chronic pain syndrome on their pertinent problem list.  Pain Assessment: Severity of Chronic pain is reported as a 2 /10. Location: Back Lower/denies. Onset: More than a month ago. Quality: Sharp. Timing: Constant. Modifying factor(s): rest, procedure. Vitals:  height is 6' 3 (1.905 m) and weight is 205 lb (93 kg). His temperature is 98.8 F (37.1 C). His blood pressure is 126/67 and his pulse is 60. His respiration is 16 and oxygen saturation is 96%.  BMI: Estimated body mass index is 25.62 kg/m as calculated from the following:   Height as of this encounter: 6' 3 (1.905 m).   Weight as of this encounter: 205 lb (93 kg).  Last encounter: 05/09/2024. Last procedure: 04/05/2024.  Reason for  encounter:   Post-Procedure Evaluation     Interventional Therapy    Procedure Type: Lumbar Facet, Medial Branch Block(s) (w/ fluoroscopic mapping) #1  Laterality: Bilateral  Level: L2, L3, L4, L5, and S1 Medial Branch/Dorsal Rami Level(s). Injecting these levels blocks the L3-4, L4-5, and L5-S1 lumbar facet joints. Imaging: Fluoroscopic guidance Spinal ([id number]) Anesthesia: Local anesthesia (1-2% Lidocaine ) Anxiolysis: IV Versed  2.0 mg            Sedation: No Sedation                       DOS: 09/05/2024 Performed by: Eric DELENA Como, MD    Effectiveness:  Initial hour after procedure: 100 %  Subsequent 4-6 hours post-procedure: 75 %  Analgesia past initial 6 hours: 80 %  Ongoing improvement:  Analgesic:  80% Function: Somewhat improved ROM: Somewhat improved    History of Present Illness   Seth Stewart is an 84 year old male who presents for follow-up regarding bilateral L2, L3, L4, L5, S1 medial branch nerve block done by my colleague Dr. Como.  He is experiencing improved mobility and comfort following his recent spinal injections which was performed in preparation for an upcoming trip. He is pleased with the outcome but is uncertain about future surgical interventions for his pain, as his surgeon advised against it at this time.  His wife is scheduled to have a knee replacement surgery on February 3rd in [place], [place] , at [facility].       Medication Review  Flaxseed (Linseed), Lactobacillus, Omega-3, acetaminophen , apixaban , metoprolol  tartrate, multivitamin with minerals, rosuvastatin , tadalafil , vitamin C, and vitamin E  History  Review  Allergy: Seth Stewart has no known allergies. Drug: Seth Stewart  reports no history of drug use. Alcohol:  reports current alcohol use of about 7.0 - 14.0 standard drinks of alcohol per week. Tobacco:  reports that he has never smoked. He has never used smokeless tobacco. Social: Mr. Glasscock   reports that he has never smoked. He has never used smokeless tobacco. He reports current alcohol use of about 7.0 - 14.0 standard drinks of alcohol per week. He reports that he does not use drugs. Medical:  has a past medical history of AF (atrial fibrillation) (HCC), Back pain, Dysphagia, Dysrhythmia, Hemorrhoids, Hyperlipidemia, and Hypertension. Surgical: Seth Stewart  has a past surgical history that includes Eye surgery (09/21/2009); Laminectomy (09/21/2002); Colonoscopy (12/06/2014); Ablation; Hernia repair (Right, 01/07/2015); Esophagogastroduodenoscopy (N/A, 10/29/2022); Back surgery; Esophagogastroduodenoscopy; Esophagogastroduodenoscopy (egd) with propofol  (N/A, 12/04/2022); Cardioversion (N/A, 02/09/2024); and ATRIAL FIBRILLATION ABLATION (N/A, 04/03/2024). Family: family history includes Cancer in his mother; Heart attack in his father and paternal uncle; Heart disease in his father.  Laboratory Chemistry Profile   Renal Lab Results  Component Value Date   BUN 11 03/29/2024   CREATININE 0.94 03/29/2024   BCR 12 03/29/2024   GFRAA >60 05/09/2019   GFRNONAA >60 05/09/2019    Hepatic Lab Results  Component Value Date   AST 21 01/03/2024   ALT 16 01/03/2024   ALBUMIN 4.6 01/03/2024   ALKPHOS 47 01/03/2024    Electrolytes Lab Results  Component Value Date   NA 134 03/29/2024   K 4.5 03/29/2024   CL 98 03/29/2024   CALCIUM  9.7 03/29/2024   PHOS 3.3 11/11/2015    Bone No results found for: VD25OH, VD125OH2TOT, CI6874NY7, CI7874NY7, 25OHVITD1, 25OHVITD2, 25OHVITD3, TESTOFREE, TESTOSTERONE  Inflammation (CRP: Acute Phase) (ESR: Chronic Phase) No results found for: CRP, ESRSEDRATE, LATICACIDVEN       Note: Above Lab results reviewed.  Recent Imaging Review  MR LUMBAR SPINE WO CONTRAST EXAM: MRI LUMBAR SPINE 09/04/2024 10:12:20 AM  TECHNIQUE: Multiplanar multisequence MRI of the lumbar spine was performed without the administration of intravenous  contrast.  COMPARISON: [facility]RE Lumbar Spine 03/16/2012.  CLINICAL HISTORY: Low back pain, symptoms persist with > 6 weeks treatment; Lumbar radiculopathy, symptoms persist with > 6 weeks treatment; CNS evaluation for degenerative disease, tumor, compression or inflammation causing dysfunction. To help plan for interventional therapy, decompressive surgery, or spinal fusion, for treatment of progressively worsening recurrent or chronic symptoms.  FINDINGS:  BONES AND ALIGNMENT: Normal alignment. Normal vertebral body heights. Bone marrow signal is unremarkable. Since a previous study, the patient has undergone laminectomies at L1-L2, L2-L3, L3-L4, L4-L5, and L5-S1. There has been interval worsening of chronic degenerative disc disease at T12-L1, L1-L2, and L2-L3.  SPINAL CORD: The conus medullaris terminates at T12-L1.  SOFT TISSUES: No paraspinal mass.  T12-L1: Moderate central spinal canal stenosis and mild-to-moderate bilateral lateral recess stenosis.  L1-L2: Moderate central spinal canal stenosis and moderate bilateral lateral recess stenosis, with questionable impingement of the left L2 nerve and lateral recess.  L2-L3: Chronic disc bulging and bilateral facet arthrosis with mild central spinal canal stenosis and mild-to-moderate bilateral lateral recess stenosis. No definite nerve root impingement.  L3-L4: Central bulging disc osteophyte complex which is eccentric to the right, causing moderate central spinal canal stenosis and moderate right lateral recess stenosis, with questionable impingement of the right L3 nerve and the lateral recess.  L4-L5: Moderate central spinal canal stenosis and mild-to-moderate bilateral lateral recess stenosis, without apparent nerve root impingement.  L5-S1: Small-to-moderate  central spinal canal stenosis and mild-to-moderate bilateral lateral recess stenosis without definite nerve root impingement.  IMPRESSION: 1. Status post  interval laminectomies at L1-2, L2-3, and L3-4, with prior laminectomies at L4-5 and L5-S1. 2. Interval worsening of chronic degenerative disc disease at T12-L1, L1-2, and L2-3. 3. Moderate central spinal canal stenosis and mild-to-moderate bilateral lateral recess stenosis at T12-L1. 4. Moderate central spinal canal stenosis and moderate bilateral lateral recess stenosis at L1-2, with questionable impingement of the left L2 nerve in the lateral recess. 5. Mild central spinal canal stenosis and mild-to-moderate bilateral lateral recess stenosis at L2-3, without definite nerve root impingement. 6. Moderate central spinal canal stenosis and moderate right lateral recess stenosis at L3-4, with questionable impingement of the right L3 nerve in the lateral recess. 7. Moderate central spinal canal stenosis and mild-to-moderate bilateral lateral recess stenosis at L4-5, without apparent nerve root impingement. 8. Small-to-moderate central spinal canal stenosis and mild-to-moderate bilateral lateral recess stenosis at L5-S1, without definite nerve root impingement.  Electronically signed by: Evalene Coho MD 09/08/2024 04:03 AM EST RP Workstation: [id number] Note: Reviewed        Physical Exam  Vitals: BP 126/67 (Cuff Size: Normal)   Pulse 60   Temp 98.8 F (37.1 C)   Resp 16   Ht 6' 3 (1.905 m)   Wt 205 lb (93 kg)   SpO2 96%   BMI 25.62 kg/m  BMI: Estimated body mass index is 25.62 kg/m as calculated from the following:   Height as of this encounter: 6' 3 (1.905 m).   Weight as of this encounter: 205 lb (93 kg). Ideal: Ideal body weight: 84.5 kg (186 lb 4.6 oz) Adjusted ideal body weight: 87.9 kg (193 lb 12.4 oz) General appearance: Well nourished, well developed, and well hydrated. In no apparent acute distress Mental status: Alert, oriented x 3 (person, place, & time)       Respiratory: No evidence of acute respiratory distress Eyes: PERLA  Improvement in low back  pain Assessment   Diagnosis Status  1. Chronic low back pain (Bilateral) w/o sciatica   2. Low back pain of over 3 months duration   3. Lumbar facet arthropathy (Multilevel) (Bilateral)   4. Lumbar facet hypertrophy (Multilevel) (Bilateral)   5. Lumbar facet joint pain   6. Mechanical low back pain   7. Failed back surgical syndrome (x2)    Controlled Controlled Controlled    Plan of Care  Patient is status post diagnostic bilateral L2, L3, L4, L5, S1 facet medial branch nerve block #1 with Dr. Naveira.  He is experiencing pain relief and improvement in his functional status.  We will continue to monitor.  Future considerations include repeating lumbar facet medial branch nerve block #2 and considering radiofrequency ablation  Mr. KARL ERWAY Sr. has a current medication list which includes the following long-term medication(s): apixaban , metoprolol  tartrate, and rosuvastatin .  Pharmacotherapy (Medications Ordered): No orders of the defined types were placed in this encounter.  Orders:  No orders of the defined types were placed in this encounter.    T11/T12 ESI (hx of L1-L5 decompression) 06/2023     Return in about 3 months (around 01/11/2025) for Follow-up discuss facet block #2.    Recent Visits Date Type Provider Dept  09/05/24 Procedure visit Tanya Glisson, MD [facility]  08/29/24 Office Visit Tanya Glisson, MD [facility]  Showing recent visits within past 90 days and meeting all other requirements Today's Visits Date Type Provider Dept  10/10/24 Office Visit Marcelino Nurse, MD [facility]  Showing today's visits and meeting all other requirements Future Appointments No visits were found meeting these conditions. Showing future appointments within next 90 days and meeting all other requirements  I discussed the assessment and treatment plan with the patient. The patient was provided an opportunity to ask questions and  all were answered. The patient agreed with the plan and demonstrated an understanding of the instructions.  Patient advised to call back or seek an in-person evaluation if the symptoms or condition worsens.  I personally spent a total of 20 minutes in the care of the patient today including preparing to see the patient, getting/reviewing separately obtained history, performing a medically appropriate exam/evaluation, counseling and educating, placing orders, and documenting clinical information in the EHR.   Note by: Nurse Marcelino, MD (TTS and AI technology used. I apologize for any typographical errors that were not detected and corrected.) Date: 10/10/2024; Time: 2:21 PM

## 2024-10-13 ENCOUNTER — Telehealth: Payer: Self-pay | Admitting: Family Medicine

## 2024-10-13 NOTE — Telephone Encounter (Signed)
 Copied from CRM #[id number]. Topic: General - Other >> Oct 13, 2024  2:22 PM Donna BRAVO wrote: Reason for CRM: faxed paperwork  for pt. calling to confirm if fax was received faxed 10/13/24 at 11:15am   Fax form and the last office visit notes together fax number [phone]

## 2024-10-17 NOTE — Telephone Encounter (Signed)
 Formed received. No active phone number available for verification of authenticity and therefore cannot proceed with required steps.

## 2024-10-18 ENCOUNTER — Telehealth: Payer: Self-pay | Admitting: Family Medicine

## 2024-10-18 NOTE — Telephone Encounter (Signed)
 Called phone number provided, there was no answer. Voicemail message left for [place] to return phone call.

## 2024-10-18 NOTE — Telephone Encounter (Signed)
 Copied from CRM #[id number]. Topic: General - Other >> Oct 18, 2024  9:12 AM Myrick T wrote: Reason for CRM: Odis from [facility] called to f/u on paperwork. He stated they were having some issue with their phone lines due to the weather but request someone call him back at [phone]

## 2024-10-19 NOTE — Telephone Encounter (Signed)
 Called phone number for verification of authenticity, there was no answer to my phone call. Therefore cannot proceed with paperwork.

## 2024-11-23 ENCOUNTER — Ambulatory Visit: Payer: Medicare Other

## 2024-11-28 ENCOUNTER — Ambulatory Visit: Admitting: Cardiovascular Disease

## 2024-12-26 ENCOUNTER — Ambulatory Visit: Admitting: Family Medicine

## 2025-01-11 ENCOUNTER — Ambulatory Visit: Admitting: Student in an Organized Health Care Education/Training Program
